# Patient Record
Sex: Male | Born: 1975 | Race: White | Hispanic: No | Marital: Married | Smoking: Never smoker
Health system: Southern US, Community
[De-identification: ages and names within clinical notes are randomized; demographics above are authoritative.]

## PROBLEM LIST (undated history)

## (undated) DIAGNOSIS — N200 Calculus of kidney: Secondary | ICD-10-CM

## (undated) DIAGNOSIS — I4891 Unspecified atrial fibrillation: Secondary | ICD-10-CM

## (undated) HISTORY — PX: TONSILLECTOMY: SUR1361

## (undated) HISTORY — PX: APPENDECTOMY: SHX54

---

## 1999-03-14 ENCOUNTER — Encounter: Payer: Self-pay | Admitting: Internal Medicine

## 1999-03-14 ENCOUNTER — Emergency Department (HOSPITAL_COMMUNITY): Admission: EM | Admit: 1999-03-14 | Discharge: 1999-03-14 | Payer: Self-pay | Admitting: Internal Medicine

## 2000-02-29 ENCOUNTER — Emergency Department (HOSPITAL_COMMUNITY): Admission: EM | Admit: 2000-02-29 | Discharge: 2000-02-29 | Payer: Self-pay | Admitting: Emergency Medicine

## 2001-01-19 ENCOUNTER — Encounter: Admission: RE | Admit: 2001-01-19 | Discharge: 2001-01-19 | Payer: Self-pay | Admitting: Family Medicine

## 2001-01-19 ENCOUNTER — Encounter: Payer: Self-pay | Admitting: Family Medicine

## 2004-01-07 ENCOUNTER — Emergency Department (HOSPITAL_COMMUNITY): Admission: EM | Admit: 2004-01-07 | Discharge: 2004-01-07 | Payer: Self-pay | Admitting: Emergency Medicine

## 2005-05-26 ENCOUNTER — Emergency Department (HOSPITAL_COMMUNITY): Admission: EM | Admit: 2005-05-26 | Discharge: 2005-05-26 | Payer: Self-pay | Admitting: Emergency Medicine

## 2006-02-07 ENCOUNTER — Emergency Department (HOSPITAL_COMMUNITY): Admission: EM | Admit: 2006-02-07 | Discharge: 2006-02-07 | Payer: Self-pay | Admitting: Emergency Medicine

## 2006-02-16 ENCOUNTER — Emergency Department (HOSPITAL_COMMUNITY): Admission: EM | Admit: 2006-02-16 | Discharge: 2006-02-16 | Payer: Self-pay | Admitting: Emergency Medicine

## 2006-02-21 ENCOUNTER — Encounter: Admission: RE | Admit: 2006-02-21 | Discharge: 2006-02-21 | Payer: Self-pay | Admitting: Family Medicine

## 2006-03-04 ENCOUNTER — Encounter: Admission: RE | Admit: 2006-03-04 | Discharge: 2006-03-04 | Payer: Self-pay | Admitting: Family Medicine

## 2007-11-27 ENCOUNTER — Observation Stay (HOSPITAL_COMMUNITY): Admission: EM | Admit: 2007-11-27 | Discharge: 2007-11-28 | Payer: Self-pay | Admitting: Emergency Medicine

## 2007-11-27 ENCOUNTER — Ambulatory Visit: Payer: Self-pay | Admitting: Cardiology

## 2007-12-06 ENCOUNTER — Ambulatory Visit: Payer: Self-pay

## 2007-12-08 ENCOUNTER — Ambulatory Visit: Payer: Self-pay

## 2009-03-05 ENCOUNTER — Emergency Department (HOSPITAL_COMMUNITY): Admission: EM | Admit: 2009-03-05 | Discharge: 2009-03-05 | Payer: Self-pay | Admitting: Emergency Medicine

## 2009-12-08 ENCOUNTER — Encounter: Admission: RE | Admit: 2009-12-08 | Discharge: 2009-12-08 | Payer: Self-pay | Admitting: Family Medicine

## 2010-01-12 ENCOUNTER — Emergency Department (HOSPITAL_COMMUNITY): Admission: EM | Admit: 2010-01-12 | Discharge: 2010-01-12 | Payer: Self-pay | Admitting: Emergency Medicine

## 2010-08-16 ENCOUNTER — Encounter: Payer: Self-pay | Admitting: Family Medicine

## 2010-12-08 NOTE — Discharge Summary (Signed)
[name], Grant NO.:  [id number]   MEDICAL RECORD NO.:  [id number]          PATIENT TYPE:  OBV   LOCATION:  3738                         FACILITY:  [facility]   PHYSICIAN:  Veverly Fells. Excell Seltzer, MD  DATE OF BIRTH:  Jun 27, 1976   DATE OF ADMISSION:  11/27/2007  DATE OF DISCHARGE:  11/28/2007                               DISCHARGE SUMMARY   PRIMARY CARDIOLOGIST:  Rollene Rotunda, MD.   PRIMARY CARE Yailyn Strack.Quita Skye Artis Flock, M.D.   DISCHARGE DIAGNOSIS:  Presyncope.   SECONDARY DIAGNOSES:  1. Chest pain.  2. Gastroesophageal reflux disease.   ALLERGIES:  No known drug allergies.   PROCEDURES:  None.   HISTORY OF PRESENT ILLNESS:  A 35 year old Caucasian male with no prior  cardiac history.  He was in his usual state of health until Nov 27, 2007,  while approximately 10:30 a.m. at work, he had sudden onset of flushing  diaphoresis associated with lightheadedness.  He subsequently developed  nausea and tachy palpitations with generalized achiness.  EMS was called  and the patient was taken to [facility] for further evaluation.  In the  ED, ECG showed no acute changes.  Point-of-care cardiac markers were  negative x1.  He was admitted for further evaluation.   HOSPITAL COURSE:  The patient ruled out for MI and has been maintained  on telemetry without any evidence tachybrady arrhythmias.  His blood  pressure and heart rates have been stable.  He had no recurrent symptoms  and has been set up for an outpatient exercise Myoview.  He will be  discharged to home today in good condition.   DISCHARGE LABS:  Hemoglobin 14.6, hematocrit 43.0, WBC 7.9, and  platelets 213. INR 1.0, sodium 140, potassium 4.1, chloride 104, CO2 of  31, BUN 6, creatinine 0.86, glucose 132, total bilirubin 0.5,  alkaline  phosphatase 59, AST 17, ALT 20, total protein 5.5, albumin 3.2, calcium  8.8, hemoglobin A1c 5.5, CK 41, MB 1.1, troponin I 0.01, total  cholesterol 148, triglycerides 171,  HDL 18, LDL 96, and TSH 0.906.   DISPOSITION:  The patient is being discharged to home today in good  condition.   FOLLOWUP PLAN AND APPOINTMENTS:  He is asked to follow up Dr. Artis Flock in  about 2 weeks.  He has outpatient exercise Myoview scheduled for Dec 06, 2007, at 8:15 a.m.   DISCHARGE MEDICATIONS:  1. Lexapro 20 mg daily.  2. Prilosec OTC 20 mg daily.   OUTSTANDING LAB STUDIES:  None.   DURATION OF DISCHARGE/ENCOUNTER:  Thirty five minutes including  physician time.      Nicolasa Ducking, ANP      Veverly Fells. Excell Seltzer, MD  Electronically Signed    CB/MEDQ  D:  11/28/2007  T:  11/29/2007  Job:  [id number]   cc:   Quita Skye. Artis Flock, M.D.

## 2010-12-08 NOTE — H&P (Signed)
NAME:  Grant [name], Grant [name] NO.:  [id number]   MEDICAL RECORD NO.:  [id number]          PATIENT TYPE:  OBV   LOCATION:  3738                         FACILITY:  [facility]   PHYSICIAN:  Rollene Rotunda, MD, FACCDATE OF BIRTH:  Nov 05, 1975   DATE OF ADMISSION:  11/27/2007  DATE OF DISCHARGE:                              HISTORY & PHYSICAL   PRIMARY CARE PHYSICIAN:  Domonique Brouillard. Artis Flock, MD   BRIEF HISTORY:  Mr. Nelles is a 35 year old white male who was  transferred via EMS to [facility] secondary to near syncope.  Mr. Yi states that he has been in good health until this morning  around 10:30, when he suddenly felt very warm and broke out in cold  sweats and did not feel quite right.  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  He states that he  got out of the car, and he stumbled and fell onto another car because he  was lightheaded and dizzy.  He did not loose consciousness and denies  fainting or blurry vision.  He denies prior occurrences.  The co-workers  assisted him back to the office, where he developed nausea and vomiting.  He thought that his heart rate might be going fast, but did not check  his pulse or blood pressure.  All in all, this lasted approximately 15  minutes, and he stated that his whole body felt achy.  He also described  to Dr. Antoine Poche a chest pressure that he gave a 4 out of 0-10 with all  this.  EMS was called; however, the report is not available.  In the  emergency room, he states that he feels fine, except now that he has a  headache.  It is also notable he does not usually eat breakfast;  however, last night, he also did not eat dinner.  He stated that he did  have some crackers yesterday afternoon, and he states that his food  intake has been adequate.   ALLERGIES:  No known drug allergies.   MEDICATIONS:  Include:  1. Prilosec 20 mg daily.  2. Lisinopril 20 mg daily.   PAST MEDICAL  HISTORY:  He specifically denies any diabetes,  hypertension, myocardial infarction, CVA, COPD, bleeding dyscrasias,  renal disorder, thyroid disorder, or hyperlipidemia.   PAST SURGICAL HISTORY:  Includes:  1. Left knee surgery.  2. T&A.  3. Appendectomy.  4. Left leg fracture with repair.   SOCIAL HISTORY:  He resides in [place] with his wife.  He has 1 son,  who is alive and well.  He works at [employer].  He has  never smoked.  Denies alcohol, drugs, other medications, specific diet,  or exercise.   FAMILY HISTORY:  Notable for his mother, who is alive at 25.  She thinks  her health is okay.  His father is 63.  He had his bypass surgery at age  22 with subsequent PTCAs.  He also has a history of diabetes with  hyperlipidemia.  He does not have any brothers and sisters.  REVIEW OF SYSTEMS:  In addition to the above is notable for positive  slurring.  His wife feels that he may have obstructive sleep apnea;  however, he has never been evaluated, and history of GERD, depression,  and mood swings, which have been treated adequately with Lexapro.  All  other points are unremarkable.   PHYSICAL EXAMINATION:  GENERAL:  Well-nourished, well-developed, obese  white male, in no apparent distress.  Father and wife are present.  VITAL SIGNS:  Temperature is 97.8, blood pressure 119/80, pulse 68,  respirations 18, and 97% sat on room air.  HEENT:  Unremarkable.  NECK:  Supple without thyromegaly, adenopathy, or JVD.  No carotid  bruits.  CHEST:  Symmetrical excursion.  Lung sounds slightly diminished at the  base, but clear to auscultation.  HEART:  PMI is not displaced.  Regular rate and rhythm.  I do not  appreciate any murmurs, rubs, clicks, and gallops.  He does have  physiological split S2.  All __________  are symmetrical and intact  without abdominal or femoral bruits.  ABDOMEN:  Obese.  Bowel sounds are present without organomegaly, masses,  or tenderness.   EXTREMITIES:  No cyanosis, clubbing, or edema.  MUSCULOSKELETAL:  Unremarkable.  NEUROLOGIC:  Unremarkable.   LABORATORY DATA:  Chest x-ray showed low-level inspiration, no acute  findings.  EKG showed normal rhythm, early repolarization, unusual P-  wave axis; however, it is noted compared to an old EKG in November 2006,  there were no significant changes.  H&H is 14.0 and 41.5.  Normal  platelets of 213, and WBC 7.9.  Electrolytes showed sodium of 140,  potassium 40, BUN 7, creatinine 1.0, and glucose 87.  Point-of-care  markers negative x1.   IMPRESSION:  1. Near syncope, sounds vasovagal by history.  2. Chest discomfort with total body aching of uncertain etiology and a      significant family history.  His risk factors include obesity.  3. Obesity with probable obstructive sleep apnea.   DISPOSITION:  Dr. Antoine Poche reviewed the patient's history, spoke with  and examined the patient.  We will admit him overnight for observation.  If his enzymes are negative, we will discharge him home, as scheduled  for a 2-day outpatient stress Myoview stress images on Dec 06, 2007 at  8:15 a.m. and the resting images on Dec 07, 2007.  Information does need  to be faxed to the office.  We have also counseled him on weight  reduction, specific diet, exercise, and weight loss.  We will also check  his electrolyte status given his past history.      Joellyn Rued, PA-C      Rollene Rotunda, MD, [facility]  Electronically Signed    EW/MEDQ  D:  11/27/2007  T:  11/28/2007  Job:  [id number]   cc:   Quita Skye. Artis Flock, M.D.  Rollene Rotunda, MD, [facility]

## 2018-05-02 ENCOUNTER — Other Ambulatory Visit: Payer: Self-pay | Admitting: Sports Medicine

## 2018-05-02 DIAGNOSIS — G8929 Other chronic pain: Secondary | ICD-10-CM

## 2018-05-02 DIAGNOSIS — M25561 Pain in right knee: Principal | ICD-10-CM

## 2018-05-07 ENCOUNTER — Ambulatory Visit
Admission: RE | Admit: 2018-05-07 | Discharge: 2018-05-07 | Disposition: A | Discharge status: Home / Self Care | Payer: BLUE CROSS/BLUE SHIELD | Source: Ambulatory Visit | Attending: Sports Medicine | Admitting: Sports Medicine

## 2018-05-07 DIAGNOSIS — M25561 Pain in right knee: Principal | ICD-10-CM

## 2018-05-07 DIAGNOSIS — G8929 Other chronic pain: Secondary | ICD-10-CM

## 2020-12-29 DIAGNOSIS — N390 Urinary tract infection, site not specified: Secondary | ICD-10-CM | POA: Diagnosis not present

## 2020-12-29 DIAGNOSIS — K760 Fatty (change of) liver, not elsewhere classified: Secondary | ICD-10-CM | POA: Diagnosis not present

## 2020-12-29 DIAGNOSIS — N3001 Acute cystitis with hematuria: Secondary | ICD-10-CM | POA: Diagnosis not present

## 2020-12-29 DIAGNOSIS — N3090 Cystitis, unspecified without hematuria: Secondary | ICD-10-CM | POA: Diagnosis not present

## 2020-12-29 DIAGNOSIS — Z79899 Other long term (current) drug therapy: Secondary | ICD-10-CM | POA: Diagnosis not present

## 2020-12-29 DIAGNOSIS — K449 Diaphragmatic hernia without obstruction or gangrene: Secondary | ICD-10-CM | POA: Diagnosis not present

## 2020-12-29 DIAGNOSIS — N200 Calculus of kidney: Secondary | ICD-10-CM | POA: Diagnosis not present

## 2020-12-29 DIAGNOSIS — M545 Low back pain, unspecified: Secondary | ICD-10-CM | POA: Diagnosis not present

## 2020-12-29 DIAGNOSIS — N23 Unspecified renal colic: Secondary | ICD-10-CM | POA: Diagnosis not present

## 2020-12-29 DIAGNOSIS — M5459 Other low back pain: Secondary | ICD-10-CM | POA: Diagnosis not present

## 2020-12-29 DIAGNOSIS — Z87442 Personal history of urinary calculi: Secondary | ICD-10-CM | POA: Diagnosis not present

## 2020-12-29 DIAGNOSIS — M549 Dorsalgia, unspecified: Secondary | ICD-10-CM | POA: Diagnosis not present

## 2020-12-29 DIAGNOSIS — R319 Hematuria, unspecified: Secondary | ICD-10-CM | POA: Diagnosis not present

## 2021-01-06 DIAGNOSIS — N200 Calculus of kidney: Secondary | ICD-10-CM | POA: Diagnosis not present

## 2021-01-06 DIAGNOSIS — R8271 Bacteriuria: Secondary | ICD-10-CM | POA: Diagnosis not present

## 2021-01-29 DIAGNOSIS — N200 Calculus of kidney: Secondary | ICD-10-CM | POA: Diagnosis not present

## 2021-01-29 DIAGNOSIS — N5201 Erectile dysfunction due to arterial insufficiency: Secondary | ICD-10-CM | POA: Diagnosis not present

## 2021-03-03 ENCOUNTER — Emergency Department (HOSPITAL_COMMUNITY)
Admission: EM | Admit: 2021-03-03 | Discharge: 2021-03-04 | Disposition: A | Discharge status: Home / Self Care | Payer: Managed Care, Other (non HMO) | Attending: Emergency Medicine | Admitting: Emergency Medicine

## 2021-03-03 ENCOUNTER — Other Ambulatory Visit: Payer: Self-pay

## 2021-03-03 ENCOUNTER — Emergency Department (HOSPITAL_COMMUNITY): Payer: Managed Care, Other (non HMO)

## 2021-03-03 DIAGNOSIS — I517 Cardiomegaly: Secondary | ICD-10-CM | POA: Diagnosis not present

## 2021-03-03 DIAGNOSIS — M7989 Other specified soft tissue disorders: Secondary | ICD-10-CM | POA: Diagnosis not present

## 2021-03-03 DIAGNOSIS — R0789 Other chest pain: Secondary | ICD-10-CM | POA: Diagnosis not present

## 2021-03-03 DIAGNOSIS — R079 Chest pain, unspecified: Secondary | ICD-10-CM | POA: Insufficient documentation

## 2021-03-03 DIAGNOSIS — M25562 Pain in left knee: Secondary | ICD-10-CM | POA: Diagnosis not present

## 2021-03-03 LAB — BASIC METABOLIC PANEL
Anion gap: 8 (ref 5–15)
BUN: 14 mg/dL (ref 6–20)
CO2: 26 mmol/L (ref 22–32)
Calcium: 9 mg/dL (ref 8.9–10.3)
Chloride: 104 mmol/L (ref 98–111)
Creatinine, Ser: 1.09 mg/dL (ref 0.61–1.24)
GFR, Estimated: >60 mL/min (ref >60)
Glucose, Bld: 104 mg/dL — ABNORMAL HIGH (ref 70–99)
Potassium: 4.1 mmol/L (ref 3.5–5.1)
Sodium: 138 mmol/L (ref 135–145)

## 2021-03-03 LAB — TROPONIN I (HIGH SENSITIVITY)
Troponin I (High Sensitivity): 3 ng/L (ref <18)
Troponin I (High Sensitivity): 4 ng/L (ref <18)

## 2021-03-03 LAB — CBC
HCT: 44.4 % (ref 39.0–52.0)
Hemoglobin: 14.4 g/dL (ref 13.0–17.0)
MCH: 28.5 pg (ref 26.0–34.0)
MCHC: 32.4 g/dL (ref 30.0–36.0)
MCV: 87.9 fL (ref 80.0–100.0)
Platelets: 185 10*3/uL (ref 150–400)
RBC: 5.05 MIL/uL (ref 4.22–5.81)
RDW: 12.5 % (ref 11.5–15.5)
WBC: 7.4 10*3/uL (ref 4.0–10.5)
nRBC: 0 % (ref 0.0–0.2)

## 2021-03-03 NOTE — ED Provider Notes (Signed)
Emergency Medicine Provider Triage Evaluation Note  Grant [name] , a 45 y.o. male  was evaluated in triage.  Pt complains of chest pain x 45 minutes along with left knee swelling.  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  Also left-sided constant chest pain that he rates about a 5 out of 10, endorsing shortness of breath, feels that he is more fatigued when doing physical activity.  No history of CAD, family history of CAD notable for father with multiple MIs.  Review of Systems  Positive: Chest pain, shortness of breath, left knee pain Negative: Fever, cough  Physical Exam  BP (!) 131/91 (BP Location: Left Arm)   Pulse 84   Temp 98.1 F (36.7 C) (Oral)   Resp 16   Ht 6\' 1"  (1.854 m)   Wt (!) 156.5 kg   SpO2 98%   BMI 45.52 kg/m  Gen:   Awake, no distress   Resp:  Normal effort  MSK:   Moves extremities without difficulty Other:  Small left knee effusion, minimally swollen.  Medical Decision Making  Medically screening exam initiated at 4:32 PM.  Appropriate orders placed.  was informed that the remainder of the evaluation will be completed by another provider, this initial triage assessment does not replace that evaluation, and the importance of remaining in the ED until their evaluation is complete.     Geri Seminole, PA-C 03/03/21 1634    05/03/21, MD 03/03/21 2259

## 2021-03-03 NOTE — ED Triage Notes (Signed)
Pt has had CP with shob x 45 min as well as left knee swelling.  Pt states he was helping at [employer] camp this morning when it happened. No n/v/d

## 2021-03-04 ENCOUNTER — Ambulatory Visit (HOSPITAL_BASED_OUTPATIENT_CLINIC_OR_DEPARTMENT_OTHER)
Admission: RE | Admit: 2021-03-04 | Discharge: 2021-03-04 | Disposition: A | Discharge status: Home / Self Care | Payer: Managed Care, Other (non HMO) | Source: Ambulatory Visit | Attending: Emergency Medicine | Admitting: Emergency Medicine

## 2021-03-04 DIAGNOSIS — M7989 Other specified soft tissue disorders: Secondary | ICD-10-CM | POA: Insufficient documentation

## 2021-03-04 MED ORDER — ENOXAPARIN SODIUM 300 MG/3ML IJ SOLN
1.0000 mg/kg | Freq: Once | INTRAMUSCULAR | Status: AC
  Administered 2021-03-04: 155 mg via SUBCUTANEOUS
  Filled 2021-03-04: qty 1.55

## 2021-03-04 NOTE — Discharge Instructions (Addendum)
Return tomorrow for the ultrasound of your leg to rule out a blood clot.  If the test is negative, this is likely a mild tendinitis or inflammation that can be treated with rest, compression, NSAIDs.  If there is a clot, we will need to put you on blood thinners.

## 2021-03-04 NOTE — Progress Notes (Signed)
Lower extremity venous has been completed.   Preliminary results in CV Proc.   Blanch Media 03/04/2021 11:26 AM

## 2021-03-04 NOTE — ED Provider Notes (Signed)
[facility] EMERGENCY DEPARTMENT Provider Note   CSN: [id number] Arrival date & time: 03/03/21  1612     History Chief Complaint  Patient presents with   Chest Pain    Grant [name] is a 45 y.o. male.  Patient presents to the emergency department with multiple complaints.  Patient reports that he woke this morning with a swollen left knee.  Since then the swelling has worsened.  Patient reports that it hurts more when he tries to bend the knee.  There is some swelling below the knee now.  Earlier today he had an episode of chest pain.  He had pain in the left side of his chest that lasted for 5 minutes and then resolved.  It has not reoccurred.  No shortness of breath.      No past medical history on file.  There are no problems to display for this patient.        No family history on file.     Home Medications Prior to Admission medications   Not on File    Allergies    Patient has no known allergies.  Review of Systems   Review of Systems  Cardiovascular:  Positive for chest pain.  Musculoskeletal:  Positive for arthralgias.  All other systems reviewed and are negative.  Physical Exam Updated Vital Signs BP 122/86 (BP Location: Left Arm)   Pulse 79   Temp 98.3 F (36.8 C) (Oral)   Resp 20   Ht 6\' 1"  (1.854 m)   Wt (!) 156.5 kg   SpO2 96%   BMI 45.52 kg/m   Physical Exam Vitals and nursing note reviewed.  Constitutional:      General: He is not in acute distress.    Appearance: Normal appearance. He is well-developed.  HENT:     Head: Normocephalic and atraumatic.     Right Ear: Hearing normal.     Left Ear: Hearing normal.     Nose: Nose normal.  Eyes:     Conjunctiva/sclera: Conjunctivae normal.     Pupils: Pupils are equal, round, and reactive to light.  Cardiovascular:     Rate and Rhythm: Regular rhythm.     Heart sounds: S1 normal and S2 normal. No murmur heard.   No friction rub. No gallop.  Pulmonary:      Effort: Pulmonary effort is normal. No respiratory distress.     Breath sounds: Normal breath sounds.  Chest:     Chest wall: No tenderness.  Abdominal:     General: Bowel sounds are normal.     Palpations: Abdomen is soft.     Tenderness: There is no abdominal tenderness. There is no guarding or rebound. Negative signs include Murphy's sign and McBurney's sign.     Hernia: No hernia is present.  Musculoskeletal:        General: Normal range of motion.     Cervical back: Normal range of motion and neck supple.     Left knee: Swelling present. No deformity, effusion, erythema or ecchymosis. Normal range of motion. Tenderness present.     Left lower leg: Swelling present.  Skin:    General: Skin is warm and dry.     Findings: No rash.  Neurological:     Mental Status: He is alert and oriented to person, place, and time.     GCS: GCS eye subscore is 4. GCS verbal subscore is 5. GCS motor subscore is 6.     Cranial Nerves:  No cranial nerve deficit.     Sensory: No sensory deficit.     Coordination: Coordination normal.  Psychiatric:        Speech: Speech normal.        Behavior: Behavior normal.        Thought Content: Thought content normal.    ED Results / Procedures / Treatments   Labs (all labs ordered are listed, but only abnormal results are displayed) Labs Reviewed  BASIC METABOLIC PANEL - Abnormal; Notable for the following components:      Result Value   Glucose, Bld 104 (*)    All other components within normal limits  CBC  TROPONIN I (HIGH SENSITIVITY)  TROPONIN I (HIGH SENSITIVITY)    EKG EKG Interpretation  Date/Time:  Tuesday March 03 2021 16:23:03 EDT Ventricular Rate:  89 PR Interval:  144 QRS Duration: 108 QT Interval:  366 QTC Calculation: 445 R Axis:   70 Text Interpretation: Normal sinus rhythm Cannot rule out Anterior infarct , age undetermined Abnormal ECG Confirmed by Gilda Crease [phone]) on 03/04/2021 1:43:41 AM  Radiology DG Chest  2 View  Result Date: 03/03/2021 CLINICAL DATA:  Chest pain EXAM: CHEST - 2 VIEW COMPARISON:  11/27/2007 FINDINGS: Heart is mildly enlarged. Lungs clear. No effusions. No acute bony abnormality. IMPRESSION: Cardiomegaly.  No active disease. Electronically Signed   By: Charlett Nose M.D.   On: 03/03/2021 17:33   DG Knee 2 Views Left  Result Date: 03/03/2021 CLINICAL DATA:  Swelling EXAM: LEFT KNEE - 1-2 VIEW COMPARISON:  None. FINDINGS: No evidence of fracture, dislocation, or joint effusion. No evidence of arthropathy or other focal bone abnormality. Soft tissues are unremarkable. IMPRESSION: Negative. Electronically Signed   By: Charlett Nose M.D.   On: 03/03/2021 17:35    Procedures Procedures   Medications Ordered in ED Medications  enoxaparin (LOVENOX) 100 mg/mL injection 155 mg (has no administration in time range)    ED Course  I have reviewed the triage vital signs and the nursing notes.  Pertinent labs & imaging results that were available during my care of the patient were reviewed by me and considered in my medical decision making (see chart for details).    MDM Rules/Calculators/A&P                           Patient presents primarily for evaluation of pain in the left knee.  Patient awakened with pain and swelling of the left knee today that has worsened through the day.  Examination reveals some slight swelling of the soft tissues in the prepatellar and suprapatellar region but there are no palpable effusions.  No erythema or warmth.  There is nothing to suggest septic arthritis.  No ligamentous instability.  Findings consistent with nonspecific swelling, treat conservatively.  Patient concerned about possibility of blood clot.  He does have some swelling of the left calf with no tenderness, venous cords.  Suspect that this is secondary to the knee swelling, but cannot rule out DVT.  Offered D-dimer versus outpatient ultrasound.  He would like to have the ultrasound  performed.  Patient did have chest pain earlier.  This seems very unlikely for a PE.  Pain lasted for 5 minutes and then resolved, did not recur.  This was earlier today, many hours ago.  Cardiac evaluation is negative.  Discussed the possibility of PE with the patient.  Offered CT chest but it is also reasonable to treat with  Lovenox and perform venous duplex.  If negative, PE is extremely unlikely.  If positive, would be a small PE and initiation treatment of DVT would be curative for small PE as well.  He understands that we have not fully come up for PE and he needs to return immediately or call 911 for chest pain or shortness of breath.  Final Clinical Impression(s) / ED Diagnoses Final diagnoses:  Acute pain of left knee  Non-cardiac chest pain    Rx / DC Orders ED Discharge Orders          Ordered    LE VENOUS        03/04/21 0214             Gilda Crease, MD 03/04/21 [phone]

## 2021-08-25 DIAGNOSIS — M1712 Unilateral primary osteoarthritis, left knee: Secondary | ICD-10-CM | POA: Diagnosis not present

## 2021-08-25 DIAGNOSIS — M25562 Pain in left knee: Secondary | ICD-10-CM | POA: Diagnosis not present

## 2021-09-01 DIAGNOSIS — M25562 Pain in left knee: Secondary | ICD-10-CM | POA: Diagnosis not present

## 2021-09-03 DIAGNOSIS — M25562 Pain in left knee: Secondary | ICD-10-CM | POA: Diagnosis not present

## 2021-10-28 DIAGNOSIS — R112 Nausea with vomiting, unspecified: Secondary | ICD-10-CM | POA: Diagnosis not present

## 2021-10-28 DIAGNOSIS — A084 Viral intestinal infection, unspecified: Secondary | ICD-10-CM | POA: Diagnosis not present

## 2022-01-11 ENCOUNTER — Emergency Department (HOSPITAL_BASED_OUTPATIENT_CLINIC_OR_DEPARTMENT_OTHER)
Admission: EM | Admit: 2022-01-11 | Discharge: 2022-01-11 | Disposition: A | Discharge status: Home / Self Care | Payer: Self-pay | Attending: Emergency Medicine | Admitting: Emergency Medicine

## 2022-01-11 ENCOUNTER — Emergency Department (HOSPITAL_BASED_OUTPATIENT_CLINIC_OR_DEPARTMENT_OTHER): Payer: Self-pay | Admitting: Radiology

## 2022-01-11 ENCOUNTER — Other Ambulatory Visit: Payer: Self-pay

## 2022-01-11 ENCOUNTER — Encounter (HOSPITAL_BASED_OUTPATIENT_CLINIC_OR_DEPARTMENT_OTHER): Payer: Self-pay | Admitting: Radiology

## 2022-01-11 ENCOUNTER — Other Ambulatory Visit (HOSPITAL_BASED_OUTPATIENT_CLINIC_OR_DEPARTMENT_OTHER): Payer: Self-pay

## 2022-01-11 DIAGNOSIS — S63501A Unspecified sprain of right wrist, initial encounter: Secondary | ICD-10-CM | POA: Insufficient documentation

## 2022-01-11 DIAGNOSIS — W228XXA Striking against or struck by other objects, initial encounter: Secondary | ICD-10-CM | POA: Insufficient documentation

## 2022-01-11 MED ORDER — OXYCODONE HCL 5 MG PO TABS
ORAL_TABLET | ORAL | 0 refills | Status: DC
  Filled 2022-01-11: qty 12, 3d supply, fill #0

## 2022-01-11 MED ORDER — IBUPROFEN 800 MG PO TABS: 800.0000 mg | ORAL_TABLET | Freq: Three times a day (TID) | ORAL | 0 refills | Status: DC | PRN

## 2022-01-11 MED ORDER — OXYCODONE HCL 5 MG PO TABS: 5.0000 mg | ORAL_TABLET | Freq: Four times a day (QID) | ORAL | 0 refills | Status: AC | PRN

## 2022-01-11 MED ORDER — OXYCODONE HCL 5 MG PO TABS
5.0000 mg | ORAL_TABLET | Freq: Once | ORAL | Status: AC
  Administered 2022-01-11: 5 mg via ORAL
  Filled 2022-01-11: qty 1

## 2022-01-11 NOTE — ED Provider Notes (Signed)
[facility] EMERGENCY DEPT Provider Note   CSN: [id number] Arrival date & time: 01/11/22  0941     History  Chief Complaint  Patient presents with   Wrist Pain    Right    Grant [name] is a 46 y.o. male.  Pt is a 47 yo male presenting for right hand pain. Pt admits to right hand pain after jumping in a pool and hitting hands against bottom. States his friend then picked him up and squeezed him further injuring his hand. Denies swelling, bruising, sensation, or motor dysfunction. Minimal improvement with motrin at home.   The history is provided by the patient. No language interpreter was used.  Wrist Pain       Home Medications Prior to Admission medications   Not on File      Allergies    Patient has no known allergies.    Review of Systems   Review of Systems  Physical Exam Updated Vital Signs BP 140/87   Pulse 95   Temp 97.9 F (36.6 C) (Oral)   Resp 18   Ht 6\' 1"  (1.854 m)   Wt (!) 158.8 kg   SpO2 97%   BMI 46.18 kg/m  Physical Exam Vitals and nursing note reviewed.  HENT:     Head: Normocephalic and atraumatic.  Cardiovascular:     Rate and Rhythm: Normal rate.  Pulmonary:     Effort: Pulmonary effort is normal.  Musculoskeletal:     Right elbow: Normal.     Left elbow: Normal.     Right forearm: Bony tenderness present.     Right wrist: Normal.     Left wrist: Normal.     Right hand: Normal. Normal pulse.     Left hand: Normal. Normal pulse.  Skin:    Capillary Refill: Capillary refill takes less than 2 seconds.     Findings: No bruising or rash.  Neurological:     General: No focal deficit present.     Mental Status: He is alert.     GCS: GCS eye subscore is 4. GCS verbal subscore is 5. GCS motor subscore is 6.     Sensory: No sensory deficit.     Motor: No weakness.     ED Results / Procedures / Treatments   Labs (all labs ordered are listed, but only abnormal results are displayed) Labs Reviewed - No data to  display  EKG None  Radiology DG Wrist Complete Right  Result Date: 01/11/2022 CLINICAL DATA:  Trauma, pain EXAM: RIGHT WRIST - COMPLETE 3+ VIEW COMPARISON:  None Available. FINDINGS: There is no evidence of fracture or dislocation. There is no evidence of arthropathy or other focal bone abnormality. Soft tissues are unremarkable. IMPRESSION: No fracture or dislocation is seen in the right wrist. Electronically Signed   By: 01/13/2022 M.D.   On: 01/11/2022 10:51    Procedures Procedures    Medications Ordered in ED Medications  oxyCODONE (Oxy IR/ROXICODONE) immediate release tablet 5 mg (has no administration in time range)    ED Course/ Medical Decision Making/ A&P                           Medical Decision Making Risk Prescription drug management.   11:23 AM 46 yo male presenting for right hand pain after injury. Patient is Aox3, no acute distress, afebrile, with stable vitals. Physical exam demonstrates tenderness to palpation of distal right radius. No gross deformities.  Arm neurovascularly intact. No snuff box tenderness. Xray demonstrates no acute process. Ace wrap applied. Medication given for pain control.   Patient in no distress and overall condition improved here in the ED. Detailed discussions were had with the patient regarding current findings, and need for close f/u with orthopedic surgery.The patient has been instructed to return immediately if the symptoms worsen in any way for re-evaluation. Patient verbalized understanding and is in agreement with current care plan. All questions answered prior to discharge.         Final Clinical Impression(s) / ED Diagnoses Final diagnoses:  Sprain of right wrist, initial encounter    Rx / DC Orders ED Discharge Orders     None         Franne Forts, DO 01/11/22 1123

## 2022-01-11 NOTE — Discharge Instructions (Signed)
Rest, elevated, ice, compression, motrin/tylenol for mild pain, oxycodone sent to pharmacy for severe pain

## 2022-01-11 NOTE — ED Triage Notes (Addendum)
Patient arrives with right wrist pain after jumping into the pool yesterday (wrist bent backwards slightly) and then having someone grab the same wrist. Rates pain a 6/10. Unable to move wrist backwards or forwards.   Patient took ibuprofen prior to coming

## 2022-01-11 NOTE — ED Notes (Signed)
Patient verbalizes understanding of discharge instructions. Opportunity for questioning and answers were provided. Patient discharged from ED.  °

## 2022-04-29 ENCOUNTER — Other Ambulatory Visit: Payer: Self-pay

## 2022-04-29 ENCOUNTER — Other Ambulatory Visit (HOSPITAL_BASED_OUTPATIENT_CLINIC_OR_DEPARTMENT_OTHER): Payer: Self-pay

## 2022-04-29 ENCOUNTER — Emergency Department (HOSPITAL_BASED_OUTPATIENT_CLINIC_OR_DEPARTMENT_OTHER)
Admission: EM | Admit: 2022-04-29 | Discharge: 2022-04-29 | Disposition: A | Discharge status: Home / Self Care | Payer: Self-pay | Attending: Emergency Medicine | Admitting: Emergency Medicine

## 2022-04-29 ENCOUNTER — Encounter (HOSPITAL_BASED_OUTPATIENT_CLINIC_OR_DEPARTMENT_OTHER): Payer: Self-pay

## 2022-04-29 ENCOUNTER — Emergency Department (HOSPITAL_BASED_OUTPATIENT_CLINIC_OR_DEPARTMENT_OTHER): Payer: Self-pay

## 2022-04-29 DIAGNOSIS — N201 Calculus of ureter: Secondary | ICD-10-CM | POA: Insufficient documentation

## 2022-04-29 LAB — COMPREHENSIVE METABOLIC PANEL
ALT: 22 U/L (ref 0–44)
AST: 17 U/L (ref 15–41)
Albumin: 4.2 g/dL (ref 3.5–5.0)
Alkaline Phosphatase: 72 U/L (ref 38–126)
Anion gap: 9 (ref 5–15)
BUN: 13 mg/dL (ref 6–20)
CO2: 27 mmol/L (ref 22–32)
Calcium: 9.4 mg/dL (ref 8.9–10.3)
Chloride: 105 mmol/L (ref 98–111)
Creatinine, Ser: 0.98 mg/dL (ref 0.61–1.24)
GFR, Estimated: >60 mL/min (ref >60)
Glucose, Bld: 111 mg/dL — ABNORMAL HIGH (ref 70–99)
Potassium: 4 mmol/L (ref 3.5–5.1)
Sodium: 141 mmol/L (ref 135–145)
Total Bilirubin: 0.7 mg/dL (ref 0.3–1.2)
Total Protein: 7 g/dL (ref 6.5–8.1)

## 2022-04-29 LAB — CBC
HCT: 47.1 % (ref 39.0–52.0)
Hemoglobin: 15.6 g/dL (ref 13.0–17.0)
MCH: 28.6 pg (ref 26.0–34.0)
MCHC: 33.1 g/dL (ref 30.0–36.0)
MCV: 86.3 fL (ref 80.0–100.0)
Platelets: 212 10*3/uL (ref 150–400)
RBC: 5.46 MIL/uL (ref 4.22–5.81)
RDW: 13 % (ref 11.5–15.5)
WBC: 7.3 10*3/uL (ref 4.0–10.5)
nRBC: 0 % (ref 0.0–0.2)

## 2022-04-29 LAB — LIPASE, BLOOD: Lipase: 26 U/L (ref 11–51)

## 2022-04-29 LAB — URINALYSIS, ROUTINE W REFLEX MICROSCOPIC
Bilirubin Urine: NEGATIVE
Glucose, UA: NEGATIVE mg/dL
Ketones, ur: NEGATIVE mg/dL
Leukocytes,Ua: NEGATIVE
Nitrite: NEGATIVE
Protein, ur: 30 mg/dL — AB
Specific Gravity, Urine: 1.032 — ABNORMAL HIGH (ref 1.005–1.030)
pH: 5.5 (ref 5.0–8.0)

## 2022-04-29 MED ORDER — IOHEXOL 300 MG/ML  SOLN
100.0000 mL | Freq: Once | INTRAMUSCULAR | Status: AC | PRN
  Administered 2022-04-29: 100 mL via INTRAVENOUS

## 2022-04-29 MED ORDER — ONDANSETRON 4 MG PO TBDP
4.0000 mg | ORAL_TABLET | Freq: Three times a day (TID) | ORAL | 0 refills | Status: DC | PRN
  Filled 2022-04-29: qty 20, 7d supply, fill #0

## 2022-04-29 MED ORDER — OXYCODONE-ACETAMINOPHEN 5-325 MG PO TABS
1.0000 | ORAL_TABLET | Freq: Four times a day (QID) | ORAL | 0 refills | Status: DC | PRN
  Filled 2022-04-29: qty 15, 4d supply, fill #0

## 2022-04-29 MED ORDER — TAMSULOSIN HCL 0.4 MG PO CAPS
0.4000 mg | ORAL_CAPSULE | Freq: Every day | ORAL | 0 refills | Status: DC
  Filled 2022-04-29: qty 30, 30d supply, fill #0

## 2022-04-29 NOTE — ED Notes (Signed)
Discharge instructions, follow up care, and prescriptions reviewed and explained, pt verbalized understanding. Pt caox4 and ambulatory on d/c.  

## 2022-04-29 NOTE — ED Triage Notes (Signed)
Patient here POV from Home.  Endorses LLQ Pain approximately 1 Week ago that was mildly relived with OTC Medication. Worsened several days afterwards.   No N/V/D. No Fevers. No Dysuria.   NAD Noted during Triage. A&Ox4. GCS 15. Ambulatory.

## 2022-04-29 NOTE — ED Provider Notes (Signed)
[facility] EMERGENCY DEPT Provider Note   CSN: [id number] Arrival date & time: 04/29/22  1241    History  Chief Complaint  Patient presents with   Abdominal Pain    Grant [name] is a 46 y.o. male history of kidney stones here for evaluation of left lower quadrant abdominal pain.  Began approximately 1 week ago.  Prior to that had a "stomach bug."  Last bowel movement was approximately 2 days ago without melena or bright red blood per rectum.  He does admit to constipation and irregular bowel movements at baseline.  Initially took some Gas-X and some OTC medications with relief however pain continues to return intermittently.  He denies any fevers, nausea, vomiting. No flank pain.  No dysuria hematuria.  States is unsure if pain is similar to his prior kidney stones.  Never had a colonoscopy. Prior appendectomy as a child. No hx of SBO. Passing flatus. Pain does not radiates into groin, scrotum.  HPI     Home Medications Prior to Admission medications   Medication Sig Start Date End Date Taking? Authorizing Provider  ondansetron (ZOFRAN-ODT) 4 MG disintegrating tablet Dissolve 1 tablet under the tongue every 8 (eight) hours as needed for nausea or vomiting. 04/29/22  Yes Kule Gascoigne A, PA-C  oxyCODONE-acetaminophen (PERCOCET/ROXICET) 5-325 MG tablet Take 1 tablet by mouth every 6 (six) hours as needed for severe pain. 04/29/22  Yes Travonna Swindle A, PA-C  tamsulosin (FLOMAX) 0.4 MG CAPS capsule Take 1 capsule (0.4 mg total) by mouth daily. 04/29/22  Yes Glena Pharris A, PA-C  ibuprofen (ADVIL) 800 MG tablet Take 1 tablet (800 mg total) by mouth every 8 (eight) hours as needed for mild pain. 01/11/22   Edwin Dada P, DO  oxyCODONE (OXY IR/ROXICODONE) 5 MG immediate release tablet Take 1 tablet by mouth every 6 hours as needed for up to 3 days for severe pain 01/11/22   Franne Forts, DO      Allergies    Patient has no known allergies.    Review of Systems    Review of Systems  Constitutional: Negative.   HENT: Negative.    Respiratory: Negative.    Cardiovascular: Negative.   Gastrointestinal:  Positive for abdominal pain and constipation. Negative for abdominal distention, anal bleeding, blood in stool, diarrhea, nausea, rectal pain and vomiting.  Genitourinary: Negative.   Musculoskeletal: Negative.   Skin: Negative.   Neurological: Negative.   All other systems reviewed and are negative.  Physical Exam Updated Vital Signs BP 119/72 (BP Location: Right Arm)   Pulse 80   Temp 98.5 F (36.9 C)   Resp 16   Ht 6\' 1"  (1.854 m)   Wt (!) 158.8 kg   SpO2 96%   BMI 46.19 kg/m  Physical Exam Vitals and nursing note reviewed.  Constitutional:      General: He is not in acute distress.    Appearance: He is well-developed. He is not ill-appearing, toxic-appearing or diaphoretic.  HENT:     Head: Normocephalic and atraumatic.     Mouth/Throat:     Mouth: Mucous membranes are moist.  Eyes:     Pupils: Pupils are equal, round, and reactive to light.  Cardiovascular:     Rate and Rhythm: Normal rate and regular rhythm.     Heart sounds: Normal heart sounds.  Pulmonary:     Effort: Pulmonary effort is normal. No respiratory distress.     Breath sounds: Normal breath sounds.  Abdominal:  General: Bowel sounds are normal. There is no distension.     Palpations: Abdomen is soft.     Tenderness: There is abdominal tenderness in the left lower quadrant. There is no right CVA tenderness, left CVA tenderness, guarding or rebound. Negative signs include Murphy's sign and McBurney's sign.     Hernia: No hernia is present.  Musculoskeletal:        General: Normal range of motion.     Cervical back: Normal range of motion and neck supple.  Skin:    General: Skin is warm and dry.  Neurological:     General: No focal deficit present.     Mental Status: He is alert and oriented to person, place, and time.    ED Results / Procedures /  Treatments   Labs (all labs ordered are listed, but only abnormal results are displayed) Labs Reviewed  COMPREHENSIVE METABOLIC PANEL - Abnormal; Notable for the following components:      Result Value   Glucose, Bld 111 (*)    All other components within normal limits  URINALYSIS, ROUTINE W REFLEX MICROSCOPIC - Abnormal; Notable for the following components:   APPearance HAZY (*)    Specific Gravity, Urine 1.032 (*)    Hgb urine dipstick MODERATE (*)    Protein, ur 30 (*)    All other components within normal limits  LIPASE, BLOOD  CBC    EKG None  Radiology CT ABDOMEN PELVIS W CONTRAST  Result Date: 04/29/2022 CLINICAL DATA:  Left lower quadrant abdominal pain x1 week constipation, history of renal stones. EXAM: CT ABDOMEN AND PELVIS WITH CONTRAST TECHNIQUE: Multidetector CT imaging of the abdomen and pelvis was performed using the standard protocol following bolus administration of intravenous contrast. RADIATION DOSE REDUCTION: This exam was performed according to the departmental dose-optimization program which includes automated exposure control, adjustment of the mA and/or kV according to patient size and/or use of iterative reconstruction technique. CONTRAST:  123mL OMNIPAQUE IOHEXOL 300 MG/ML  SOLN COMPARISON:  None Available. FINDINGS: Lower chest: No acute abnormality.  Small hiatal hernia. Hepatobiliary: Hypodensity in the posterior right lobe of the liver measures 7.1 cm on image 15/2 this appears to demonstrate peripheral nodular discontinuous foci of postcontrast enhancement. Gallbladder is decompressed. No biliary ductal dilation. Pancreas: No pancreatic ductal dilation or evidence of acute inflammation. Spleen: No splenomegaly or focal splenic lesion. Adrenals/Urinary Tract: Bilateral adrenal glands appear normal. No hydronephrosis. Nonobstructive 4 mm stone in the mid ureter at the L4-L5 vertebral body level. Additional punctate nonobstructive left-greater-than-right renal  stones. Urinary bladder is unremarkable for degree of distension. Stomach/Bowel: No radiopaque enteric contrast material was administered. Small hiatal hernia otherwise the stomach is unremarkable for degree of distension. No pathologic dilation of small or large bowel. The appendix and terminal ileum appear normal. No evidence of acute bowel inflammation. Vascular/Lymphatic: Aortic atherosclerosis. No pathologically enlarged abdominal or pelvic lymph nodes Reproductive: Prostate is unremarkable. Other: No significant abdominopelvic free fluid. Musculoskeletal: No acute osseous abnormality. IMPRESSION: 1. Nonobstructive 4 mm stone in the mid left ureter at the L4-L5 vertebral body level. 2. Additional punctate nonobstructive left-greater-than-right renal stones. 3. Hypodensity in the posterior right lobe of the liver measures 7.1 cm this appears to demonstrate peripheral nodular discontinuous foci of postcontrast enhancement and is favored to reflect a benign hepatic hemangioma. However, consider more definitive characterization with nonemergent hepatic protocol MRI with and without contrast. 4.  Aortic Atherosclerosis (ICD10-I70.0). Electronically Signed   By: Dahlia Bailiff M.D.   On: 04/29/2022  15:30    Procedures Procedures    Medications Ordered in ED Medications  iohexol (OMNIPAQUE) 300 MG/ML solution 100 mL (100 mLs Intravenous Contrast Given 04/29/22 1512)    ED Course/ Medical Decision Making/ A&P    46 year old here for evaluation of left lower quadrant Donnell pain over the last week.  Pain intermittent.  Initially relieved with OTC medications however continues to have intermittent pain.  No urinary symptoms.  Does admit to some constipation however has this at baseline.  No fevers.  No sick contacts.  Passing flatus, no history of SBO. He does not want anything for pain at this time. No prior colonoscopy or hx of diverticulitis.   Labs and imaging personally viewed and interpreted:  CBC  without leukocytosis, hemoglobin 15.6 Metabolic panel glucose 111 UA negative for infection however does show blood Lipase 26 CT AP 4 mm left ureter stone.  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  I discussed follow-up with PCP for this for further evaluation with MRI.  Patient reassessed.  His pain has been controlled.  Has not needed any medication here in the emergency department.  Will DC home symptomatic management.  Encouraged to follow-up closely with urology if his symptoms do not improve, return for worsening symptoms.  On repeat exam patient does not have a surgical abdomin and there are no peritoneal signs.  No indication of appendicitis, bowel obstruction, bowel perforation, cholecystitis, diverticulitis.   The patient has been appropriately medically screened and/or stabilized in the ED. I have low suspicion for any other emergent medical condition which would require further screening, evaluation or treatment in the ED or require inpatient management.  Patient is hemodynamically stable and in no acute distress.  Patient able to ambulate in department prior to ED.  Evaluation does not show acute pathology that would require ongoing or additional emergent interventions while in the emergency department or further inpatient treatment.  I have discussed the diagnosis with the patient and answered all questions.  Pain is been managed while in the emergency department and patient has no further complaints prior to discharge.  Patient is comfortable with plan discussed in room and is stable for discharge at this time.  I have discussed strict return precautions for returning to the emergency department.  Patient was encouraged to follow-up with PCP/specialist refer to at discharge.                           Medical Decision Making Amount and/or Complexity of Data Reviewed Independent Historian: spouse External Data Reviewed: labs, radiology and notes. Labs: ordered.  Decision-making details documented in ED Course. Radiology: ordered and independent interpretation performed. Decision-making details documented in ED Course.  Risk OTC drugs. Prescription drug management. Decision regarding hospitalization. Diagnosis or treatment significantly limited by social determinants of health.          Final Clinical Impression(s) / ED Diagnoses Final diagnoses:  Ureteral stone    Rx / DC Orders ED Discharge Orders          Ordered    oxyCODONE-acetaminophen (PERCOCET/ROXICET) 5-325 MG tablet  Every 6 hours PRN        04/29/22 1542    tamsulosin (FLOMAX) 0.4 MG CAPS capsule  Daily        04/29/22 1542    ondansetron (ZOFRAN-ODT) 4 MG disintegrating tablet  Every 8 hours PRN        04/29/22 1542  Aarya Quebedeaux A, PA-C 04/29/22 1604    Pricilla Loveless, MD 05/03/22 [phone]

## 2022-04-29 NOTE — Discharge Instructions (Addendum)
Take medication as prescribed.  Please use caution as the medication provided to you today, Percocet is a controlled substance.  Can make you sleepy.  Do not make life or that decisions while taking this medication.  As discussed in the room the Flomax medication can make you lightheaded and dizzy when you go from sitting to standing.  Make sure to dangle your feet on the edge of the bed.  I have also written you for follow-up with urology, call to make and appointment  Return for new or worsening symptoms such as fever, inability to urinate, severe pain or vomiting uncontrolled with medications provided today

## 2022-05-05 ENCOUNTER — Other Ambulatory Visit: Payer: Self-pay

## 2022-05-05 DIAGNOSIS — N201 Calculus of ureter: Secondary | ICD-10-CM | POA: Insufficient documentation

## 2022-05-05 NOTE — ED Triage Notes (Signed)
Patient c/o left flank pain and urinary frequency.  Patient reports he has passed one kidney stone.  Patient reports he has also had vomiting, unrelieved by zofran.  Patient took his prescribed pain meds PTA.

## 2022-05-06 ENCOUNTER — Encounter (HOSPITAL_BASED_OUTPATIENT_CLINIC_OR_DEPARTMENT_OTHER): Payer: Self-pay | Admitting: Emergency Medicine

## 2022-05-06 ENCOUNTER — Emergency Department (HOSPITAL_BASED_OUTPATIENT_CLINIC_OR_DEPARTMENT_OTHER)
Admission: EM | Admit: 2022-05-06 | Discharge: 2022-05-06 | Disposition: A | Discharge status: Home / Self Care | Payer: Self-pay | Attending: Emergency Medicine | Admitting: Emergency Medicine

## 2022-05-06 DIAGNOSIS — N23 Unspecified renal colic: Secondary | ICD-10-CM

## 2022-05-06 HISTORY — DX: Calculus of kidney: N20.0

## 2022-05-06 LAB — URINALYSIS, ROUTINE W REFLEX MICROSCOPIC
Bilirubin Urine: NEGATIVE
Glucose, UA: NEGATIVE mg/dL
Ketones, ur: NEGATIVE mg/dL
Leukocytes,Ua: NEGATIVE
Nitrite: NEGATIVE
Protein, ur: NEGATIVE mg/dL
Specific Gravity, Urine: 1.014 (ref 1.005–1.030)
pH: 5 (ref 5.0–8.0)

## 2022-05-06 MED ORDER — KETOROLAC TROMETHAMINE 15 MG/ML IJ SOLN
15.0000 mg | Freq: Once | INTRAMUSCULAR | Status: AC
  Administered 2022-05-06: 15 mg via INTRAVENOUS
  Filled 2022-05-06: qty 1

## 2022-05-06 MED ORDER — HYDROMORPHONE HCL 1 MG/ML IJ SOLN
1.0000 mg | Freq: Once | INTRAMUSCULAR | Status: AC
  Administered 2022-05-06: 1 mg via INTRAVENOUS
  Filled 2022-05-06: qty 1

## 2022-05-06 MED ORDER — ONDANSETRON HCL 4 MG/2ML IJ SOLN
4.0000 mg | Freq: Once | INTRAMUSCULAR | Status: AC
  Administered 2022-05-06: 4 mg via INTRAVENOUS
  Filled 2022-05-06: qty 2

## 2022-05-06 MED ORDER — OXYCODONE-ACETAMINOPHEN 5-325 MG PO TABS: 1.0000 | ORAL_TABLET | Freq: Four times a day (QID) | ORAL | 0 refills | Status: DC | PRN

## 2022-05-06 NOTE — ED Provider Notes (Signed)
[facility] EMERGENCY Provider Note: Georgena Spurling, MD, FACEP  CSN: [id number] MRN: [id number] ARRIVAL: 05/05/22 at [place]: [id number]   CHIEF COMPLAINT  Flank Pain   HISTORY OF PRESENT ILLNESS  05/06/22 1:23 AM Grant [name] is a 46 y.o. male who was seen on 04/29/2022 for left flank pain.  He was diagnosed with a nonobstructive 4 mm stone in the mid left ureter.  He was also noted to have multiple punctate stones in his kidneys bilaterally.  He states he passed a stone 3 days ago.  2 days ago he developed some mild pressure in his left lower quadrant.  This acutely worsened yesterday evening about 9 PM and became severe.  He rated his pain as a 9 out of 10 at its worst.  It has eased slightly since arrival here.  He was nauseated and unable to keep down a Zofran tablet due to vomiting.  The pain is not worse with movement or palpation.   Past Medical History:  Diagnosis Date   Kidney stones     Past Surgical History:  Procedure Laterality Date   APPENDECTOMY     TONSILLECTOMY      History reviewed. No pertinent family history.  Social History   Tobacco Use   Smoking status: Never   Smokeless tobacco: Never  Substance Use Topics   Alcohol use: Yes    Comment: Seldom   Drug use: Not Currently    Prior to Admission medications   Medication Sig Start Date End Date Taking? Authorizing Provider  ondansetron (ZOFRAN-ODT) 4 MG disintegrating tablet Dissolve 1 tablet under the tongue every 8 (eight) hours as needed for nausea or vomiting. 04/29/22   Henderly, Britni A, PA-C  oxyCODONE-acetaminophen (PERCOCET/ROXICET) 5-325 MG tablet Take 1-2 tablets by mouth every 6 (six) hours as needed for severe pain. 05/06/22   Vannesa Abair, MD  tamsulosin (FLOMAX) 0.4 MG CAPS capsule Take 1 capsule (0.4 mg total) by mouth daily. 04/29/22   Henderly, Britni A, PA-C    Allergies Patient has no known allergies.   REVIEW OF SYSTEMS  Negative except as noted here or in the History of  Present Illness.   PHYSICAL EXAMINATION  Initial Vital Signs Blood pressure 122/65, pulse 82, temperature 97.8 F (36.6 C), temperature source Oral, resp. rate 17, height 6\' 1"  (1.854 m), weight (!) 158.8 kg, SpO2 96 %.  Examination General: Well-developed, high BMI male in no acute distress; appearance consistent with age of record HENT: normocephalic; atraumatic Eyes: Normal appearance Neck: supple Heart: regular rate and rhythm Lungs: clear to auscultation bilaterally Abdomen: soft; nondistended; nontender; bowel sounds present GU: No CVA tenderness Extremities: No deformity; full range of motion; pulses normal Neurologic: Awake, alert and oriented; motor function intact in all extremities and symmetric; no facial droop Skin: Warm and dry Psychiatric: Normal mood and affect   RESULTS  Summary of this visit's results, reviewed and interpreted by myself:   EKG Interpretation  Date/Time:    Ventricular Rate:    PR Interval:    QRS Duration:   QT Interval:    QTC Calculation:   R Axis:     Text Interpretation:         Laboratory Studies: Results for orders placed or performed during the hospital encounter of 05/06/22 (from the past 24 hour(s))  Urinalysis, Routine w reflex microscopic Urine, Clean Catch     Status: Abnormal   Collection Time: 05/06/22  1:31 AM  Result Value Ref Range   Color, Urine  YELLOW YELLOW   APPearance CLEAR CLEAR   Specific Gravity, Urine 1.014 1.005 - 1.030   pH 5.0 5.0 - 8.0   Glucose, UA NEGATIVE NEGATIVE mg/dL   Hgb urine dipstick LARGE (A) NEGATIVE   Bilirubin Urine NEGATIVE NEGATIVE   Ketones, ur NEGATIVE NEGATIVE mg/dL   Protein, ur NEGATIVE NEGATIVE mg/dL   Nitrite NEGATIVE NEGATIVE   Leukocytes,Ua NEGATIVE NEGATIVE   RBC / HPF 21-50 0 - 5 RBC/hpf   WBC, UA 0-5 0 - 5 WBC/hpf   Squamous Epithelial / LPF 0-5 0 - 5   Mucus PRESENT    Imaging Studies: No results found.  ED COURSE and MDM  Nursing notes, initial and subsequent  vitals signs, including pulse oximetry, reviewed and interpreted by myself.  Vitals:   05/05/22 2358 05/06/22 0100 05/06/22 0200  BP: (!) 148/96 122/65 111/60  Pulse: 87 82 76  Resp: 18 17 16   Temp: 97.8 F (36.6 C)    TempSrc: Oral    SpO2: 97% 96% 94%  Weight: (!) 158.8 kg    Height: 6\' 1"  (1.854 m)     Medications  ondansetron (ZOFRAN) injection 4 mg (4 mg Intravenous Given 05/06/22 0135)  HYDROmorphone (DILAUDID) injection 1 mg (1 mg Intravenous Given 05/06/22 0134)  ketorolac (TORADOL) 15 MG/ML injection 15 mg (15 mg Intravenous Given 05/06/22 0134)   2:07 AM Patient pain-free at this time.  I suspect he was or is passing another stone.  The previous stone was noted to be 4 mm and he passed that 3 days ago.  The other known stones were punctate and I suspect he will pass this quickly as well.  We will refill his prescriptions.   PROCEDURES  Procedures   ED DIAGNOSES     ICD-10-CM   1. Ureteral colic  N23          Shilah Hefel, MD 05/06/22 [phone]

## 2022-07-12 ENCOUNTER — Emergency Department: Payer: Self-pay

## 2022-07-12 ENCOUNTER — Encounter: Payer: Self-pay | Admitting: Emergency Medicine

## 2022-07-12 ENCOUNTER — Emergency Department
Admission: EM | Admit: 2022-07-12 | Discharge: 2022-07-12 | Disposition: A | Discharge status: Home / Self Care | Payer: Self-pay | Attending: Emergency Medicine | Admitting: Emergency Medicine

## 2022-07-12 ENCOUNTER — Other Ambulatory Visit: Payer: Self-pay

## 2022-07-12 DIAGNOSIS — Z1152 Encounter for screening for COVID-19: Secondary | ICD-10-CM | POA: Insufficient documentation

## 2022-07-12 DIAGNOSIS — R079 Chest pain, unspecified: Secondary | ICD-10-CM | POA: Insufficient documentation

## 2022-07-12 LAB — TROPONIN I (HIGH SENSITIVITY)
Troponin I (High Sensitivity): 23 ng/L — ABNORMAL HIGH (ref <18)
Troponin I (High Sensitivity): 23 ng/L — ABNORMAL HIGH (ref <18)

## 2022-07-12 LAB — CBC
HCT: 47 % (ref 39.0–52.0)
Hemoglobin: 15.2 g/dL (ref 13.0–17.0)
MCH: 27.4 pg (ref 26.0–34.0)
MCHC: 32.3 g/dL (ref 30.0–36.0)
MCV: 84.7 fL (ref 80.0–100.0)
Platelets: 223 10*3/uL (ref 150–400)
RBC: 5.55 MIL/uL (ref 4.22–5.81)
RDW: 12.6 % (ref 11.5–15.5)
WBC: 7.3 10*3/uL (ref 4.0–10.5)
nRBC: 0 % (ref 0.0–0.2)

## 2022-07-12 LAB — COMPREHENSIVE METABOLIC PANEL
ALT: 23 U/L (ref 0–44)
AST: 21 U/L (ref 15–41)
Albumin: 3.8 g/dL (ref 3.5–5.0)
Alkaline Phosphatase: 70 U/L (ref 38–126)
Anion gap: 6 (ref 5–15)
BUN: 10 mg/dL (ref 6–20)
CO2: 27 mmol/L (ref 22–32)
Calcium: 9.4 mg/dL (ref 8.9–10.3)
Chloride: 106 mmol/L (ref 98–111)
Creatinine, Ser: 0.85 mg/dL (ref 0.61–1.24)
GFR, Estimated: >60 mL/min (ref >60)
Glucose, Bld: 127 mg/dL — ABNORMAL HIGH (ref 70–99)
Potassium: 4.1 mmol/L (ref 3.5–5.1)
Sodium: 139 mmol/L (ref 135–145)
Total Bilirubin: 1 mg/dL (ref 0.3–1.2)
Total Protein: 6.9 g/dL (ref 6.5–8.1)

## 2022-07-12 LAB — RESP PANEL BY RT-PCR (RSV, FLU A&B, COVID)  RVPGX2
Influenza A by PCR: NEGATIVE
Influenza B by PCR: NEGATIVE
Resp Syncytial Virus by PCR: NEGATIVE
SARS Coronavirus 2 by RT PCR: NEGATIVE

## 2022-07-12 LAB — D-DIMER, QUANTITATIVE: D-Dimer, Quant: 0.28 ug/mL-FEU (ref 0.00–0.50)

## 2022-07-12 MED ORDER — ACETAMINOPHEN 500 MG PO TABS
1000.0000 mg | ORAL_TABLET | Freq: Once | ORAL | Status: AC
  Administered 2022-07-12: 1000 mg via ORAL
  Filled 2022-07-12: qty 2

## 2022-07-12 NOTE — ED Triage Notes (Signed)
PT in with co chest pain that started this am, denies any cardiac history or recent illness.

## 2022-07-12 NOTE — ED Provider Notes (Signed)
[facility] Provider Note    Event Date/Time   First MD Initiated Contact with Patient 07/12/22 [phone]     (approximate)   History   Chief Complaint: Chest Pain   HPI  Grant [name] is a 46 y.o. male with no known past medical history who comes ED complaining of central chest pain, described as dull ache that lasted for 5 minutes, onset during sexual intercourse.  Associated with shortness of breath.  No diaphoresis.  Resolved with rest.  Nonradiating.  Currently feels asymptomatic.  This is never happened before.  No known prior heart issues.  Does not have a cardiologist.  Only symptom currently is a generalized headache which was not thunderclap in onset and feels throbbing.     Physical Exam   Triage Vital Signs: ED Triage Vitals  Enc Vitals Group     BP 07/12/22 0816 (!) 125/93     Pulse Rate 07/12/22 0816 (!) 108     Resp 07/12/22 0816 20     Temp 07/12/22 0816 98.5 F (36.9 C)     Temp Source 07/12/22 0816 Oral     SpO2 07/12/22 0816 95 %     Weight 07/12/22 0813 (!) 345 lb (156.5 kg)     Height 07/12/22 0813 6' (1.829 m)     Head Circumference --      Peak Flow --      Pain Score 07/12/22 0813 6     Pain Loc --      Pain Edu? --      Excl. in GC? --     Most recent vital signs: Vitals:   07/12/22 0816 07/12/22 0935  BP: (!) 125/93 (!) 120/90  Pulse: (!) 108 100  Resp: 20 18  Temp: 98.5 F (36.9 C)   SpO2: 95% 95%    General: Awake, no distress.  CV:  Good peripheral perfusion.  Tachycardia, heart rate 100.  No murmurs Resp:  Normal effort.  Clear to auscultation bilaterally Abd:  No distention.  Soft nontender Other:  No lower extremity edema.  Thyroid nonpalpable.  Moist oral mucosa   ED Results / Procedures / Treatments   Labs (all labs ordered are listed, but only abnormal results are displayed) Labs Reviewed  COMPREHENSIVE METABOLIC PANEL - Abnormal; Notable for the following components:      Result Value    Glucose, Bld 127 (*)    All other components within normal limits  TROPONIN I (HIGH SENSITIVITY) - Abnormal; Notable for the following components:   Troponin I (High Sensitivity) 23 (*)    All other components within normal limits  TROPONIN I (HIGH SENSITIVITY) - Abnormal; Notable for the following components:   Troponin I (High Sensitivity) 23 (*)    All other components within normal limits  RESP PANEL BY RT-PCR (RSV, FLU A&B, COVID)  RVPGX2  CBC  D-DIMER, QUANTITATIVE     EKG Interpreted by me Sinus tachycardia rate 109.  Rightward axis, normal intervals.  S1Q3T3 pattern.  No acute ischemic changes.   RADIOLOGY Chest x-ray interpreted by me, appears normal.  Radiology report reviewed   PROCEDURES:  Procedures   MEDICATIONS ORDERED IN ED: Medications  acetaminophen (TYLENOL) tablet 1,000 mg (1,000 mg Oral Given 07/12/22 0923)     IMPRESSION / MDM / ASSESSMENT AND PLAN / ED COURSE  I reviewed the triage vital signs and the nursing notes.  Differential diagnosis includes, but is not limited to, angina pectoralis, non-STEMI, pulmonary embolism, viral illness  Patient's presentation is most consistent with acute presentation with potential threat to life or bodily function.     Clinical Course as of 07/12/22 1228  Mon Jul 12, 2022  3662 Patient presents with episode of dull chest ache and shortness of breath that lasted for 5 minutes precipitated by sexual intercourse this morning.  Resolved after resting.  Has not taken any nitrates today.  Currently asymptomatic.  He presents with mild tachycardia, initial labs show troponin slightly elevated at 23.  EKG shows S1Q3T3 pattern.  Symptoms are not consistent with PE, and clinically presentation is more suspicious for angina pectoralis.  Will repeat troponin, obtain D-dimer.  Tylenol for headache. [PS]    Clinical Course User Index [PS] Sharman Cheek, MD     ----------------------------------------- 12:28 PM on 07/12/2022 ----------------------------------------- Remains asymptomatic in the emergency department.  Repeat troponin is unchanged.  D-dimer is normal.  Viral swab negative.  Will refer to urgent cardiology follow-up for further evaluation.  Considered hospitalization but with resolution of his symptoms after a brief period and reassuring workup, I think this is not warranted.   FINAL CLINICAL IMPRESSION(S) / ED DIAGNOSES   Final diagnoses:  Nonspecific chest pain     Rx / DC Orders   ED Discharge Orders          Ordered    Ambulatory referral to Cardiology       Comments: If you have not heard from the Cardiology office within the next 72 hours please call [phone].   07/12/22 1225             Note:  This document was prepared using Dragon voice recognition software and may include unintentional dictation errors.   Sharman Cheek, MD 07/12/22 1229

## 2022-07-12 NOTE — Discharge Instructions (Signed)
Your chest x-ray and lab tests today were all okay.  Please follow-up with cardiology clinic for further evaluation of your symptoms.  Avoid strenuous activity until you see cardiology.

## 2022-08-24 ENCOUNTER — Emergency Department (HOSPITAL_BASED_OUTPATIENT_CLINIC_OR_DEPARTMENT_OTHER)
Admission: EM | Admit: 2022-08-24 | Discharge: 2022-08-24 | Disposition: A | Discharge status: Home / Self Care | Payer: Self-pay | Attending: Emergency Medicine | Admitting: Emergency Medicine

## 2022-08-24 ENCOUNTER — Other Ambulatory Visit: Payer: Self-pay

## 2022-08-24 ENCOUNTER — Emergency Department (HOSPITAL_BASED_OUTPATIENT_CLINIC_OR_DEPARTMENT_OTHER): Payer: Self-pay

## 2022-08-24 ENCOUNTER — Encounter (HOSPITAL_BASED_OUTPATIENT_CLINIC_OR_DEPARTMENT_OTHER): Payer: Self-pay | Admitting: Emergency Medicine

## 2022-08-24 DIAGNOSIS — Z20822 Contact with and (suspected) exposure to covid-19: Secondary | ICD-10-CM | POA: Insufficient documentation

## 2022-08-24 DIAGNOSIS — D72829 Elevated white blood cell count, unspecified: Secondary | ICD-10-CM | POA: Insufficient documentation

## 2022-08-24 DIAGNOSIS — R000 Tachycardia, unspecified: Secondary | ICD-10-CM | POA: Insufficient documentation

## 2022-08-24 DIAGNOSIS — J189 Pneumonia, unspecified organism: Secondary | ICD-10-CM | POA: Insufficient documentation

## 2022-08-24 DIAGNOSIS — R739 Hyperglycemia, unspecified: Secondary | ICD-10-CM | POA: Insufficient documentation

## 2022-08-24 LAB — CBC
HCT: 46.5 % (ref 39.0–52.0)
Hemoglobin: 15.1 g/dL (ref 13.0–17.0)
MCH: 27.4 pg (ref 26.0–34.0)
MCHC: 32.5 g/dL (ref 30.0–36.0)
MCV: 84.4 fL (ref 80.0–100.0)
Platelets: 209 10*3/uL (ref 150–400)
RBC: 5.51 MIL/uL (ref 4.22–5.81)
RDW: 12.7 % (ref 11.5–15.5)
WBC: 15.5 10*3/uL — ABNORMAL HIGH (ref 4.0–10.5)
nRBC: 0 % (ref 0.0–0.2)

## 2022-08-24 LAB — COMPREHENSIVE METABOLIC PANEL
ALT: 24 U/L (ref 0–44)
AST: 16 U/L (ref 15–41)
Albumin: 4.2 g/dL (ref 3.5–5.0)
Alkaline Phosphatase: 63 U/L (ref 38–126)
Anion gap: 6 (ref 5–15)
BUN: 10 mg/dL (ref 6–20)
CO2: 29 mmol/L (ref 22–32)
Calcium: 9.5 mg/dL (ref 8.9–10.3)
Chloride: 104 mmol/L (ref 98–111)
Creatinine, Ser: 0.99 mg/dL (ref 0.61–1.24)
GFR, Estimated: >60 mL/min (ref >60)
Glucose, Bld: 115 mg/dL — ABNORMAL HIGH (ref 70–99)
Potassium: 4.7 mmol/L (ref 3.5–5.1)
Sodium: 139 mmol/L (ref 135–145)
Total Bilirubin: 1 mg/dL (ref 0.3–1.2)
Total Protein: 6.8 g/dL (ref 6.5–8.1)

## 2022-08-24 LAB — RESP PANEL BY RT-PCR (RSV, FLU A&B, COVID)  RVPGX2
Influenza A by PCR: NEGATIVE
Influenza B by PCR: NEGATIVE
Resp Syncytial Virus by PCR: NEGATIVE
SARS Coronavirus 2 by RT PCR: NEGATIVE

## 2022-08-24 LAB — LIPASE, BLOOD: Lipase: 12 U/L (ref 11–51)

## 2022-08-24 LAB — TROPONIN I (HIGH SENSITIVITY): Troponin I (High Sensitivity): 14 ng/L (ref <18)

## 2022-08-24 MED ORDER — SODIUM CHLORIDE 0.9 % IV BOLUS
1000.0000 mL | Freq: Once | INTRAVENOUS | Status: AC
  Administered 2022-08-24: 1000 mL via INTRAVENOUS

## 2022-08-24 MED ORDER — DOXYCYCLINE HYCLATE 100 MG PO CAPS: 100.0000 mg | ORAL_CAPSULE | Freq: Two times a day (BID) | ORAL | 0 refills | Status: DC

## 2022-08-24 MED ORDER — SODIUM CHLORIDE 0.9 % IV SOLN
1.0000 g | Freq: Once | INTRAVENOUS | Status: AC
  Administered 2022-08-24: 1 g via INTRAVENOUS
  Filled 2022-08-24: qty 10

## 2022-08-24 NOTE — Discharge Instructions (Signed)
Contact a health care provider if: You have a fever. You have trouble sleeping because you cannot control your cough with cough medicine. Get help right away if: Your shortness of breath becomes worse. Your chest pain increases. Your sickness becomes worse, especially if you are an older adult or have a weak immune system. You cough up blood. These symptoms may be an emergency. Get help right away. Call 911. Do not wait to see if the symptoms will go away. Do not drive yourself to the hospital.

## 2022-08-24 NOTE — ED Triage Notes (Signed)
Woe up this am emesis , chills , cough , fever .

## 2022-08-24 NOTE — ED Provider Notes (Signed)
[name] Provider Note   CSN: [id number] Arrival date & time: 08/24/22  2440     History  Chief Complaint  Patient presents with   Emesis    Grant [name] is a 47 y.o. male past medical history of obesity and kidney stones who presents emergency department chief complaint of flulike symptoms.  He had onset of flulike symptoms beginning this morning.  Patient complains of nausea, vomiting, temporal fever of 101.6.  He denies flank pain, abdominal pain, diarrhea, urinary symptoms.  He has no cough or runny nose.  He denies any known exposure to sick contacts however in the community at this time is epidemic COVID-19, RSV and influenza.  He has no other complaints at this time.  He took antipyretics for his fever which has resolved.  He states that he always gets high heart rate when he is at the doctors because he "gets nervous.   Emesis      Home Medications Prior to Admission medications   Medication Sig Start Date End Date Taking? Authorizing Provider  doxycycline (VIBRAMYCIN) 100 MG capsule Take 1 capsule (100 mg total) by mouth 2 (two) times daily. 08/24/22  Yes Doloros Kwolek, PA-C  ondansetron (ZOFRAN-ODT) 4 MG disintegrating tablet Dissolve 1 tablet under the tongue every 8 (eight) hours as needed for nausea or vomiting. 04/29/22   Henderly, Britni A, PA-C  oxyCODONE-acetaminophen (PERCOCET/ROXICET) 5-325 MG tablet Take 1-2 tablets by mouth every 6 (six) hours as needed for severe pain. 05/06/22   Molpus, John, MD  tamsulosin (FLOMAX) 0.4 MG CAPS capsule Take 1 capsule (0.4 mg total) by mouth daily. 04/29/22   Henderly, Britni A, PA-C      Allergies    Patient has no known allergies.    Review of Systems   Review of Systems  Gastrointestinal:  Positive for vomiting.    Physical Exam Updated Vital Signs BP 122/87   Pulse 94   Temp 98.1 F (36.7 C)   Resp 12   Wt (!) 156.5 kg   SpO2 98%   BMI 46.79 kg/m  Physical  Exam Vitals and nursing note reviewed.  Constitutional:      General: He is not in acute distress.    Appearance: He is well-developed. He is not diaphoretic.  HENT:     Head: Normocephalic and atraumatic.     Mouth/Throat:     Mouth: Mucous membranes are moist.     Pharynx: No posterior oropharyngeal erythema.  Eyes:     General: No scleral icterus.    Extraocular Movements: Extraocular movements intact.     Conjunctiva/sclera: Conjunctivae normal.     Pupils: Pupils are equal, round, and reactive to light.  Cardiovascular:     Rate and Rhythm: Regular rhythm. Tachycardia present.     Heart sounds: Normal heart sounds.     Comments: Occasional skipped beats on auscultation Pulmonary:     Effort: Pulmonary effort is normal. No respiratory distress.     Breath sounds: Normal breath sounds.  Abdominal:     Palpations: Abdomen is soft.     Tenderness: There is no abdominal tenderness.  Musculoskeletal:     Cervical back: Normal range of motion and neck supple.  Skin:    General: Skin is warm and dry.  Neurological:     Mental Status: He is alert.  Psychiatric:        Behavior: Behavior normal.     ED Results / Procedures / Treatments  Labs (all labs ordered are listed, but only abnormal results are displayed) Labs Reviewed  CBC - Abnormal; Notable for the following components:      Result Value   WBC 15.5 (*)    All other components within normal limits  COMPREHENSIVE METABOLIC PANEL - Abnormal; Notable for the following components:   Glucose, Bld 115 (*)    All other components within normal limits  RESP PANEL BY RT-PCR (RSV, FLU A&B, COVID)  RVPGX2  LIPASE, BLOOD  TROPONIN I (HIGH SENSITIVITY)    EKG None  Radiology DG Chest Port 1 View  Result Date: 08/24/2022 CLINICAL DATA:  Fever, cough. EXAM: PORTABLE CHEST 1 VIEW COMPARISON:  July 12, 2022. FINDINGS: The heart size and mediastinal contours are within normal limits. Right lung is clear. Minimal left  basilar opacity is noted concerning for subsegmental atelectasis or possibly infiltrate. The visualized skeletal structures are unremarkable. IMPRESSION: Minimal left basilar subsegmental atelectasis or infiltrate. Electronically Signed   By: Marijo Conception M.D.   On: 08/24/2022 10:29    Procedures Procedures    Medications Ordered in ED Medications  sodium chloride 0.9 % bolus 1,000 mL (0 mLs Intravenous Stopped 08/24/22 1343)  cefTRIAXone (ROCEPHIN) 1 g in sodium chloride 0.9 % 100 mL IVPB (0 g Intravenous Stopped 08/24/22 1338)    ED Course/ Medical Decision Making/ A&P Clinical Course as of 08/25/22 1728  Tue Aug 24, 2022  1221 DG Chest [place] 1 View Patient appears to have left basilar infiltrate which may represent pneumonia.  Given his symptoms we will treat as such with Rocephin and doxycycline. [AH]  1221 Troponin I (High Sensitivity) [AH]  1221 Resp panel by RT-PCR (RSV, Flu A&B, Covid) Anterior Nasal Swab [AH]  1221 WBC(!): 15.5 [AH]  1221 Lipase, blood [AH]  1221 Glucose(!): 115 [AH]    Clinical Course User Index [AH] Margarita Mail, PA-C                             Medical Decision Making Patient presents with flu like sxs, cough and vomiting, Differential diagnosis for emergent cause of cough includes but is not limited to upper respiratory infection, lower respiratory infection, allergies, asthma, irritants, foreign body, medications such as ACE inhibitors, reflux, asthma, CHF, lung cancer, interstitial lung disease, psychiatric causes, postnasal drip and postinfectious bronchospasm.  After review of all data points patient appears to have CAP.   Patient given rocephin and antiemetics. HDS  Will discharge with doxycycline. Discussed reutrn precautions,   Amount and/or Complexity of Data Reviewed Independent Historian: spouse Labs: ordered. Decision-making details documented in ED Course.    Details: Leukocytosis and hyperglycemia likely acute phase  reactant Radiology: ordered and independent interpretation performed. Decision-making details documented in ED Course.    Details: Left basilar inflitrate ECG/medicine tests: ordered and independent interpretation performed.    Details: NSR rate  98 with pvcs and early repol  Risk Prescription drug management.           Final Clinical Impression(s) / ED Diagnoses Final diagnoses:  Community acquired pneumonia of left lower lobe of lung    Rx / DC Orders ED Discharge Orders          Ordered    doxycycline (VIBRAMYCIN) 100 MG capsule  2 times daily        08/24/22 1314              Margarita Mail, [place] 08/25/22 1728  Elnora Morrison, MD 08/31/22 [phone]

## 2022-10-22 ENCOUNTER — Ambulatory Visit: Payer: BC Managed Care – PPO | Attending: Cardiovascular Disease | Admitting: Cardiovascular Disease

## 2022-10-22 ENCOUNTER — Encounter: Payer: Self-pay | Admitting: Cardiovascular Disease

## 2022-10-22 VITALS — BP 138/82 | HR 96 | Ht 73.0 in | Wt 357.0 lb

## 2022-10-22 DIAGNOSIS — R072 Precordial pain: Secondary | ICD-10-CM

## 2022-10-22 DIAGNOSIS — R400 Somnolence: Secondary | ICD-10-CM | POA: Diagnosis not present

## 2022-10-22 DIAGNOSIS — R0683 Snoring: Secondary | ICD-10-CM

## 2022-10-22 DIAGNOSIS — I700 Atherosclerosis of aorta: Secondary | ICD-10-CM

## 2022-10-22 DIAGNOSIS — R0602 Shortness of breath: Secondary | ICD-10-CM | POA: Diagnosis not present

## 2022-10-22 MED ORDER — METOPROLOL TARTRATE 100 MG PO TABS: 100.0000 mg | ORAL_TABLET | Freq: Once | ORAL | 0 refills | Status: DC

## 2022-10-22 MED ORDER — OMEPRAZOLE 40 MG PO CPDR: 40.0000 mg | DELAYED_RELEASE_CAPSULE | Freq: Every day | ORAL | 3 refills | Status: DC

## 2022-10-22 NOTE — Progress Notes (Signed)
Cardiology Office Note:    Date:  10/22/2022   ID:  Grant [name], DOB 01-16-1976, MRN [id number]  PCP:  Grant [name], No   [facility] Providers Cardiologist:  None     Referring MD: Grant Mew, MD   Chief Complaint  Patient presents with   Shortness of Breath   Chest Pain  Grant [name] is a 47 y.o. male who is being seen today for the evaluation of dyspnea and chest pain at the request of Grant Mew, MD.   History of Present Illness:    Grant [name] is a 47 y.o. male with a hx of morbid obesity and nephrolithiasis who has recently developed problems with dyspnea and chest heaviness.  He has not really had any regular follow-up with a physician in the last couple of years.  He is recently developed dyspnea and chest heaviness associated with sexual intercourse.  Most recently his symptoms persisted for as much as 30 minutes afterwards.  He was seen in the emergency room in December for these complaints.  His workup showed a minimally elevated and stable troponin level at 23.  Chest x-ray was unremarkable.  His ECG did show sinus tachycardia with rightward axis, incomplete right bundle branch block and S1Q3T3 pattern but no ischemic changes.  D-dimer was normal and angiography was not pursued.  (His ECG in August 2022 did show similar pattern of S1Q 3 without t wave inversion in lead III).  He was seen back in the emergency room 08/24/2022 with flulike symptoms, low-grade fever and elevated white blood cell count electrocardiogram was similar but showed several PACs.  He was very much unaware of palpitations despite reportedly having irregular rhythm physical exam.  The patient specifically denies any chest pain at rest, dyspnea at rest, orthopnea, paroxysmal nocturnal dyspnea, syncope, palpitations, focal neurological deficits, intermittent claudication, lower extremity edema, unexplained weight gain, cough, hemoptysis or wheezing.   He has severe snoring, his wife  reports frequent episodes of apnea, although he reports waking up feeling pretty refreshed in the morning.  He is unable to watch a movie or a ball game without falling asleep.  STOP-BANG score is 7.  He does not smoke or have diabetes mellitus.  He has been told that his lipid profile is "good".  He has normal renal function.  Aortic atherosclerosis was incidentally noted on a CT of the abdomen performed in October 2023 for kidney stones.  His father Grant [name] has a longstanding history of coronary artery disease (onset at age 48) and severe ischemic cardiomyopathy and has a defibrillator.  Past Medical History:  Diagnosis Date   Kidney stones     Past Surgical History:  Procedure Laterality Date   APPENDECTOMY     TONSILLECTOMY      Current Medications: Current Meds  Medication Sig   metoprolol tartrate (LOPRESSOR) 100 MG tablet Take 1 tablet (100 mg total) by mouth once for 1 dose. PLEASE TAKE METOPROLOL 2  HOURS PRIOR TO CTA SCAN.   omeprazole (PRILOSEC) 40 MG capsule Take 1 capsule (40 mg total) by mouth daily.   [DISCONTINUED] omeprazole (PRILOSEC OTC) 20 MG tablet Take 20 mg by mouth daily.     Allergies:   Patient has no known allergies.   Social History   Socioeconomic History   Marital status: Married    Spouse name: Not on file   Number of children: Not on file   Years of education: Not on file   Highest education level: Not on  file  Occupational History   Not on file  Tobacco Use   Smoking status: Never   Smokeless tobacco: Never  Substance and Sexual Activity   Alcohol use: Yes    Comment: Seldom   Drug use: Not Currently   Sexual activity: Not Currently  Other Topics Concern   Not on file  Social History Narrative   Not on file   Social Determinants of Health   Financial Resource Strain: Not on file  Food Insecurity: Not on file  Transportation Needs: Not on file  Physical Activity: Not on file  Stress: Not on file  Social Connections: Not on file      Family History: The patient's father has early onset CAD at age 67 with myocardial infarction, congestive heart failure and implantable defibrillator.  ROS:   Please see the history of present illness.     All other systems reviewed and are negative.  EKGs/Labs/Other Studies Reviewed:    The following studies were reviewed today: Notes, labs, ECG tracings and x-rays from multiple recent hospital ED visits  EKG:  EKG is not ordered today.  The ekg ordered 08/24/2022 was personally reviewed and demonstrates sinus tachycardia with occasional PACs, incomplete right bundle branch block with QRS 109 ms, subtle nonspecific ST segment elevation in multiple leads, normal QTc 422 ms.  Recent Labs: 08/24/2022: ALT 24; BUN 10; Creatinine, Ser 0.99; Hemoglobin 15.1; Platelets 209; Potassium 4.7; Sodium 139  Recent Lipid Panel No results found for: "CHOL", "TRIG", "HDL", "CHOLHDL", "VLDL", "LDLCALC", "LDLDIRECT"   Risk Assessment/Calculations:           STOP-Bang Score:  7       Physical Exam:    VS:  BP 138/82   Pulse 96   Ht 6\' 1"  (1.854 m)   Wt (!) 357 lb (161.9 kg)   SpO2 98%   BMI 47.10 kg/m     Wt Readings from Last 3 Encounters:  10/22/22 (!) 357 lb (161.9 kg)  08/24/22 (!) 345 lb (156.5 kg)  07/12/22 (!) 345 lb (156.5 kg)     GEN: Morbidly obese, well nourished, well developed in no acute distress HEENT: Normal he has relative retrognathia, broad neck, crowded oropharynx NECK: No JVD; No carotid bruits LYMPHATICS: No lymphadenopathy CARDIAC: RRR, no murmurs, rubs, gallops RESPIRATORY:  Clear to auscultation without rales, wheezing or rhonchi  ABDOMEN: Soft, non-tender, non-distended MUSCULOSKELETAL:  No edema; No deformity  SKIN: Warm and dry NEUROLOGIC:  Alert and oriented x 3 PSYCHIATRIC:  Normal affect   ASSESSMENT:    1. Precordial pain   2. Shortness of breath   3. Snoring   4. Daytime somnolence   5. Atherosclerosis of aorta (HCC)    PLAN:    In  order of problems listed above:  Chest pain: During physical activity, concerning for prolonged episode of angina pectoris.  Recommend coronary CT angiography. Exertional dyspnea: Multiple possible explanations, not the least of which is morbid obesity.  Strongly suspect unrecognized obstructive sleep apnea that will require treatment.  However, biggest concern is that he may have multivessel coronary artery disease .  Check echocardiogram to look for both left ventricular and right ventricular systolic function. OSA: Extremely likely diagnosis.  He does not have a smart phone for [place] to do a itamar home sleep study.  Will try to get him scheduled for a split-night study Aortic atherosclerosis: Incidentally noted on a CT that was performed when he had barely turned 47 years old, greatly increases suspicion for coronary  artery disease.  Need to get an updated lipid profile.  Suspect we will shoot for target LDL less than 70 based on findings on CT angiography. Morbid obesity: His wife has encouraged him to undergo bariatric surgery but he has no desire to do so.           Medication Adjustments/Labs and Tests Ordered: Current medicines are reviewed at length with the patient today.  Concerns regarding medicines are outlined above.  Orders Placed This Encounter  Procedures   CT CORONARY MORPH W/CTA COR W/SCORE W/CA W/CM &/OR WO/CM   Basic metabolic panel   ECHOCARDIOGRAM COMPLETE   Split night study   Meds ordered this encounter  Medications   metoprolol tartrate (LOPRESSOR) 100 MG tablet    Sig: Take 1 tablet (100 mg total) by mouth once for 1 dose. PLEASE TAKE METOPROLOL 2  HOURS PRIOR TO CTA SCAN.    Dispense:  1 tablet    Refill:  0   omeprazole (PRILOSEC) 40 MG capsule    Sig: Take 1 capsule (40 mg total) by mouth daily.    Dispense:  90 capsule    Refill:  3    Patient Instructions  Medication Instructions:  Metoprolol 100mg  2 hours prior to CTA *If you need a refill on your  cardiac medications before your next appointment, please call your pharmacy*   Lab Work: BMET- within 30 days of CTA If you have labs (blood work) drawn today and your tests are completely normal, you will receive your results only by: [place] (if you have MyChart) OR A paper copy in the mail If you have any lab test that is abnormal or we need to change your treatment, we will call you to review the results.   Testing/Procedures: Your physician has requested that you have an echocardiogram. Echocardiography is a painless test that uses sound waves to create images of your heart. It provides your doctor with information about the size and shape of your heart and how well your heart's chambers and valves are working. This procedure takes approximately one hour. There are no restrictions for this procedure. Please do NOT wear cologne, perfume, aftershave, or lotions (deodorant is allowed). Please arrive 15 minutes prior to your appointment time.   Your physician has requested that you have cardiac CT. Cardiac computed tomography (CT) is a painless test that uses an x-ray machine to take clear, detailed pictures of your heart. For further information please visit [email or web address]. Please follow instruction sheet as given.     Your cardiac CT will be scheduled at one of the below locations:   [facility] [address] [place], [place] [address] [phone]  If scheduled at [facility], please arrive at the [facility] and Children's Entrance (Entrance C2) of [facility] 30 minutes prior to test start time. You can use the FREE valet parking offered at entrance C (encouraged to control the heart rate for the test)  Proceed to the [facility] Radiology Department (first floor) to check-in and test prep.  All radiology patients and guests should use entrance C2 at [facility], accessed from [facility], even though the hospital's  physical address listed is [address].     Please follow these instructions carefully (unless otherwise directed):  Hold all erectile dysfunction medications at least 3 days (72 hrs) prior to test. (Ie viagra, cialis, sildenafil, tadalafil, etc) We will administer nitroglycerin during this exam.   On the Night  Before the Test: Be sure to Drink plenty of water. Do not consume any caffeinated/decaffeinated beverages or chocolate 12 hours prior to your test. Do not take any antihistamines 12 hours prior to your test.  On the Day of the Test: Drink plenty of water until 1 hour prior to the test. Do not eat any food 1 hour prior to test. You may take your regular medications prior to the test.  Take metoprolol (Lopressor) 100mg  two hours prior to test. If you take Furosemide/Hydrochlorothiazide/Spironolactone, please HOLD on the morning of the test.  After the Test: Drink plenty of water. After receiving IV contrast, you may experience a mild flushed feeling. This is normal. On occasion, you may experience a mild rash up to 24 hours after the test. This is not dangerous. If this occurs, you can take Benadryl 25 mg and increase your fluid intake. If you experience trouble breathing, this can be serious. If it is severe call 911 IMMEDIATELY. If it is mild, please call our office. If you take any of these medications: Glipizide/Metformin, Avandament, Glucavance, please do not take 48 hours after completing test unless otherwise instructed.  We will call to schedule your test 2-4 weeks out understanding that some insurance companies will need an authorization prior to the service being performed.   For non-scheduling related questions, please contact the cardiac imaging nurse navigator should you have any questions/concerns: Marchia Bond, Cardiac Imaging Nurse Navigator Gordy Clement, Cardiac Imaging Nurse Navigator [facility] Direct Office Dial:  [phone]   For scheduling needs, including cancellations and rescheduling, please call [place], [phone].    Follow-Up: At [facility], you and your health needs are our priority.  As part of our continuing mission to provide you with exceptional heart care, we have created designated Provider Care Teams.  These Care Teams include your primary Cardiologist (physician) and Advanced Practice Providers (APPs -  Physician Assistants and Nurse Practitioners) who all work together to provide you with the care you need, when you need it.  We recommend signing up for the patient portal called "MyChart".  Sign up information is provided on this After Visit Summary.  MyChart is used to connect with patients for Virtual Visits (Telemedicine).  Patients are able to view lab/test results, encounter notes, upcoming appointments, etc.  Non-urgent messages can be sent to your provider as well.   To learn more about what you can do with MyChart, go to [email or web address].    Your next appointment:   4 month(s)  Provider:   Dr Sallyanne Kuster      Signed, Sanda Klein, MD  10/22/2022 8:24 PM    [place]

## 2022-10-22 NOTE — Patient Instructions (Addendum)
Medication Instructions:  Metoprolol 100mg  2 hours prior to CTA *If you need a refill on your cardiac medications before your next appointment, please call your pharmacy*   Lab Work: BMET- within 30 days of CTA If you have labs (blood work) drawn today and your tests are completely normal, you will receive your results only by: [place] (if you have MyChart) OR A paper copy in the mail If you have any lab test that is abnormal or we need to change your treatment, we will call you to review the results.   Testing/Procedures: Your physician has requested that you have an echocardiogram. Echocardiography is a painless test that uses sound waves to create images of your heart. It provides your doctor with information about the size and shape of your heart and how well your heart's chambers and valves are working. This procedure takes approximately one hour. There are no restrictions for this procedure. Please do NOT wear cologne, perfume, aftershave, or lotions (deodorant is allowed). Please arrive 15 minutes prior to your appointment time.   Your physician has requested that you have cardiac CT. Cardiac computed tomography (CT) is a painless test that uses an x-ray machine to take clear, detailed pictures of your heart. For further information please visit [email or web address]. Please follow instruction sheet as given.     Your cardiac CT will be scheduled at one of the below locations:   [facility] [address] [place], [place] [address] [phone]  If scheduled at [facility], please arrive at the [facility] and Children's Entrance (Entrance C2) of [facility] 30 minutes prior to test start time. You can use the FREE valet parking offered at entrance C (encouraged to control the heart rate for the test)  Proceed to the [facility] Radiology Department (first floor) to check-in and test prep.  All radiology patients and guests should use entrance C2  at [facility], accessed from [facility], even though the hospital's physical address listed is [address].     Please follow these instructions carefully (unless otherwise directed):  Hold all erectile dysfunction medications at least 3 days (72 hrs) prior to test. (Ie viagra, cialis, sildenafil, tadalafil, etc) We will administer nitroglycerin during this exam.   On the Night Before the Test: Be sure to Drink plenty of water. Do not consume any caffeinated/decaffeinated beverages or chocolate 12 hours prior to your test. Do not take any antihistamines 12 hours prior to your test.  On the Day of the Test: Drink plenty of water until 1 hour prior to the test. Do not eat any food 1 hour prior to test. You may take your regular medications prior to the test.  Take metoprolol (Lopressor) 100mg  two hours prior to test. If you take Furosemide/Hydrochlorothiazide/Spironolactone, please HOLD on the morning of the test.  After the Test: Drink plenty of water. After receiving IV contrast, you may experience a mild flushed feeling. This is normal. On occasion, you may experience a mild rash up to 24 hours after the test. This is not dangerous. If this occurs, you can take Benadryl 25 mg and increase your fluid intake. If you experience trouble breathing, this can be serious. If it is severe call 911 IMMEDIATELY. If it is mild, please call our office. If you take any of these medications: Glipizide/Metformin, Avandament, Glucavance, please do not take 48 hours after completing test unless otherwise instructed.  We will call to schedule your test 2-4 weeks  out understanding that some insurance companies will need an authorization prior to the service being performed.   For non-scheduling related questions, please contact the cardiac imaging nurse navigator should you have any questions/concerns: Marchia Bond, Cardiac Imaging Nurse Navigator Gordy Clement, Cardiac  Imaging Nurse Navigator [facility] Direct Office Dial: [phone]   For scheduling needs, including cancellations and rescheduling, please call [place], [phone].    Follow-Up: At [facility], you and your health needs are our priority.  As part of our continuing mission to provide you with exceptional heart care, we have created designated Provider Care Teams.  These Care Teams include your primary Cardiologist (physician) and Advanced Practice Providers (APPs -  Physician Assistants and Nurse Practitioners) who all work together to provide you with the care you need, when you need it.  We recommend signing up for the patient portal called "MyChart".  Sign up information is provided on this After Visit Summary.  MyChart is used to connect with patients for Virtual Visits (Telemedicine).  Patients are able to view lab/test results, encounter notes, upcoming appointments, etc.  Non-urgent messages can be sent to your provider as well.   To learn more about what you can do with MyChart, go to [email or web address].    Your next appointment:   4 month(s)  Provider:   Dr Sallyanne Kuster

## 2022-10-22 NOTE — Progress Notes (Signed)
Pt does not have a smart phone- unable to get an [facility]

## 2022-10-27 DIAGNOSIS — R072 Precordial pain: Secondary | ICD-10-CM | POA: Diagnosis not present

## 2022-10-28 ENCOUNTER — Other Ambulatory Visit: Payer: Self-pay | Admitting: *Deleted

## 2022-10-28 ENCOUNTER — Telehealth (HOSPITAL_COMMUNITY): Payer: Self-pay | Admitting: *Deleted

## 2022-10-28 DIAGNOSIS — R400 Somnolence: Secondary | ICD-10-CM

## 2022-10-28 DIAGNOSIS — R0683 Snoring: Secondary | ICD-10-CM

## 2022-10-28 LAB — BASIC METABOLIC PANEL
BUN/Creatinine Ratio: 11 (ref 9–20)
BUN: 10 mg/dL (ref 6–24)
CO2: 23 mmol/L (ref 20–29)
Calcium: 9.4 mg/dL (ref 8.7–10.2)
Chloride: 104 mmol/L (ref 96–106)
Creatinine, Ser: 0.9 mg/dL (ref 0.76–1.27)
Glucose: 73 mg/dL (ref 70–99)
Potassium: 4.5 mmol/L (ref 3.5–5.2)
Sodium: 143 mmol/L (ref 134–144)
eGFR: 107 mL/min/{1.73_m2} (ref >59)

## 2022-10-28 NOTE — Telephone Encounter (Signed)
Reaching out to patient to offer assistance regarding upcoming cardiac imaging study; pt verbalizes understanding of appt date/time, parking situation and where to check in, pre-test NPO status and medications ordered, and verified current allergies; name and call back number provided for further questions should they arise  Grant Mclinden RN Navigator Cardiac Imaging [facility] [phone] office [phone] cell  Patient to take 100mg metoprolol tartrate two hours prior to his cardiac CT scan. He is aware to arrive at 3pm. 

## 2022-10-29 ENCOUNTER — Ambulatory Visit (HOSPITAL_COMMUNITY): Admission: RE | Admit: 2022-10-29 | Payer: BC Managed Care – PPO | Source: Ambulatory Visit

## 2022-11-11 ENCOUNTER — Telehealth (HOSPITAL_COMMUNITY): Payer: Self-pay | Admitting: *Deleted

## 2022-11-11 NOTE — Telephone Encounter (Signed)
Reaching out to patient to offer assistance regarding upcoming cardiac imaging study; pt verbalizes understanding of appt date/time, parking situation and where to check in, pre-test NPO status and medications ordered, and verified current allergies; name and call back number provided for further questions should they arise  Lanique Gonzalo RN Navigator Cardiac Imaging [facility] [phone] office [phone] cell  Patient to take 100mg metoprolol tartrate two hours prior to his cardiac CT scan. He is aware to arrive at 3:30pm. 

## 2022-11-12 ENCOUNTER — Ambulatory Visit (HOSPITAL_COMMUNITY)
Admission: RE | Admit: 2022-11-12 | Discharge: 2022-11-12 | Disposition: A | Discharge status: Home / Self Care | Payer: BC Managed Care – PPO | Source: Ambulatory Visit | Attending: Cardiovascular Disease | Admitting: Cardiovascular Disease

## 2022-11-12 DIAGNOSIS — R072 Precordial pain: Secondary | ICD-10-CM | POA: Diagnosis not present

## 2022-11-12 MED ORDER — IOHEXOL 350 MG/ML SOLN
100.0000 mL | Freq: Once | INTRAVENOUS | Status: AC | PRN
  Administered 2022-11-12: 100 mL via INTRAVENOUS

## 2022-11-12 MED ORDER — METOPROLOL TARTRATE 5 MG/5ML IV SOLN
INTRAVENOUS | Status: AC
  Filled 2022-11-12: qty 20

## 2022-11-12 MED ORDER — METOPROLOL TARTRATE 5 MG/5ML IV SOLN
10.0000 mg | Freq: Once | INTRAVENOUS | Status: AC
  Administered 2022-11-12: 10 mg via INTRAVENOUS

## 2022-11-12 MED ORDER — NITROGLYCERIN 0.4 MG SL SUBL
0.8000 mg | SUBLINGUAL_TABLET | Freq: Once | SUBLINGUAL | Status: AC
  Administered 2022-11-12: 0.8 mg via SUBLINGUAL

## 2022-11-12 MED ORDER — NITROGLYCERIN 0.4 MG SL SUBL
SUBLINGUAL_TABLET | SUBLINGUAL | Status: AC
  Filled 2022-11-12: qty 2

## 2022-11-15 ENCOUNTER — Telehealth: Payer: Self-pay | Admitting: Emergency Medicine

## 2022-11-15 DIAGNOSIS — E7800 Pure hypercholesterolemia, unspecified: Secondary | ICD-10-CM

## 2022-11-15 NOTE — Telephone Encounter (Signed)
Called pt and he states that he saw and understood Dr [employer]. Instructed that we need a lipid panel done soon. He said he can come in on Friday; told him no food or drink after midnight- water is ok. He verbalized understanding.   Orders placed for lipid panel

## 2022-11-18 ENCOUNTER — Telehealth: Payer: Self-pay | Admitting: *Deleted

## 2022-11-18 NOTE — Telephone Encounter (Signed)
Message left for patient to call me to confirm insurance information in order for me to do PA for sleep study. Both policies say patient is non active.

## 2022-11-19 ENCOUNTER — Other Ambulatory Visit: Payer: Self-pay

## 2022-11-19 DIAGNOSIS — E7800 Pure hypercholesterolemia, unspecified: Secondary | ICD-10-CM

## 2022-11-20 LAB — LIPID PANEL
Chol/HDL Ratio: 5.8 ratio — ABNORMAL HIGH (ref 0.0–5.0)
Cholesterol, Total: 163 mg/dL (ref 100–199)
HDL: 28 mg/dL — ABNORMAL LOW (ref >39)
LDL Chol Calc (NIH): 119 mg/dL — ABNORMAL HIGH (ref 0–99)
Triglycerides: 83 mg/dL (ref 0–149)
VLDL Cholesterol Cal: 16 mg/dL (ref 5–40)

## 2022-11-21 ENCOUNTER — Other Ambulatory Visit: Payer: Self-pay | Admitting: Cardiovascular Disease

## 2022-11-21 MED ORDER — ROSUVASTATIN CALCIUM 20 MG PO TABS: 20.0000 mg | ORAL_TABLET | Freq: Every day | ORAL | 3 refills | Status: DC

## 2022-11-22 ENCOUNTER — Other Ambulatory Visit: Payer: Self-pay | Admitting: Emergency Medicine

## 2022-11-22 DIAGNOSIS — E7800 Pure hypercholesterolemia, unspecified: Secondary | ICD-10-CM

## 2022-11-22 NOTE — Addendum Note (Signed)
Addended by: Scheryl Marten on: 11/22/2022 08:52 AM   Modules accepted: Orders

## 2022-11-22 NOTE — Progress Notes (Signed)
Orders for lipid panel placed; return for lab work in 3 months

## 2022-11-25 ENCOUNTER — Encounter (HOSPITAL_COMMUNITY): Payer: Self-pay

## 2022-11-25 ENCOUNTER — Ambulatory Visit (HOSPITAL_COMMUNITY): Payer: BC Managed Care – PPO | Attending: Cardiovascular Disease

## 2022-11-26 ENCOUNTER — Encounter (HOSPITAL_COMMUNITY): Payer: Self-pay | Admitting: Cardiovascular Disease

## 2022-12-13 ENCOUNTER — Encounter: Payer: Self-pay | Admitting: Emergency Medicine

## 2022-12-13 ENCOUNTER — Emergency Department: Payer: BC Managed Care – PPO

## 2022-12-13 ENCOUNTER — Emergency Department
Admission: EM | Admit: 2022-12-13 | Discharge: 2022-12-13 | Disposition: A | Discharge status: Home / Self Care | Payer: BC Managed Care – PPO | Attending: Emergency Medicine | Admitting: Emergency Medicine

## 2022-12-13 ENCOUNTER — Other Ambulatory Visit: Payer: Self-pay

## 2022-12-13 DIAGNOSIS — R0789 Other chest pain: Secondary | ICD-10-CM | POA: Diagnosis not present

## 2022-12-13 DIAGNOSIS — R072 Precordial pain: Secondary | ICD-10-CM | POA: Insufficient documentation

## 2022-12-13 DIAGNOSIS — R079 Chest pain, unspecified: Secondary | ICD-10-CM

## 2022-12-13 LAB — BASIC METABOLIC PANEL
Anion gap: 5 (ref 5–15)
BUN: 15 mg/dL (ref 6–20)
CO2: 27 mmol/L (ref 22–32)
Calcium: 9.3 mg/dL (ref 8.9–10.3)
Chloride: 106 mmol/L (ref 98–111)
Creatinine, Ser: 0.9 mg/dL (ref 0.61–1.24)
GFR, Estimated: >60 mL/min (ref >60)
Glucose, Bld: 109 mg/dL — ABNORMAL HIGH (ref 70–99)
Potassium: 4.4 mmol/L (ref 3.5–5.1)
Sodium: 138 mmol/L (ref 135–145)

## 2022-12-13 LAB — CBC
HCT: 46.8 % (ref 39.0–52.0)
Hemoglobin: 15 g/dL (ref 13.0–17.0)
MCH: 27.3 pg (ref 26.0–34.0)
MCHC: 32.1 g/dL (ref 30.0–36.0)
MCV: 85.1 fL (ref 80.0–100.0)
Platelets: 204 10*3/uL (ref 150–400)
RBC: 5.5 MIL/uL (ref 4.22–5.81)
RDW: 12.5 % (ref 11.5–15.5)
WBC: 8.4 10*3/uL (ref 4.0–10.5)
nRBC: 0 % (ref 0.0–0.2)

## 2022-12-13 LAB — TROPONIN I (HIGH SENSITIVITY)
Troponin I (High Sensitivity): 4 ng/L (ref <18)
Troponin I (High Sensitivity): 4 ng/L (ref <18)

## 2022-12-13 MED ORDER — ASPIRIN 81 MG PO CHEW
324.0000 mg | CHEWABLE_TABLET | Freq: Once | ORAL | Status: AC
  Administered 2022-12-13: 324 mg via ORAL
  Filled 2022-12-13: qty 4

## 2022-12-13 MED ORDER — NITROGLYCERIN 0.4 MG SL SUBL: 0.4000 mg | SUBLINGUAL_TABLET | SUBLINGUAL | Status: DC | PRN

## 2022-12-13 NOTE — ED Triage Notes (Signed)
Pt ambulatory to triage with reports that he woke up this am with left sided chest pressure and palpitations. Pt reports vomit x 2 this am.

## 2022-12-13 NOTE — Discharge Instructions (Addendum)
You are seen in the emergency department for chest pain.  You had 2 heart enzymes that were normal, do not believe you are having a heart attack today.  It is importantly follow-up closely with cardiology to discuss further workup for cardiac reasons for your chest pain.  Return to the emergency department if you have return of your symptoms.

## 2022-12-13 NOTE — ED Notes (Signed)
See triage note  Presents with chest pain which started this am   States pain did not wake him up   Describes as pressure  and felt like hear was racing

## 2022-12-13 NOTE — ED Provider Notes (Addendum)
[facility] Provider Note    Event Date/Time   First MD Initiated Contact with Patient 12/13/22 [phone]     (approximate)   History   Chest Pain   HPI  Grant [name] is a 47 y.o. male past medical history significant for obesity, nephrolithiasis, who presents to the emergency department with chest pain.  Chest pain started at 630 this morning while he was at rest.  Substernal chest pressure associated with an episode of nausea, diaphoresis and breaking out into a hot sweat.  Currently rates his pain as 4/10.  Recent coronary artery perfusion study that showed no obvious acute findings.  Told that if he had recurrent episodes of chest pain he may need a cardiac catheterization.  No abdominal pain.  No fever or chills.  No cough or shortness of breath.  No history of DVT or PE.  No recent travel or surgery.  Family history coronary artery disease from his father.      Physical Exam   Triage Vital Signs: ED Triage Vitals [12/13/22 0737]  Enc Vitals Group     BP 129/76     Pulse Rate 86     Resp 17     Temp 98.2 F (36.8 C)     Temp Source Oral     SpO2 97 %     Weight (!) 345 lb (156.5 kg)     Height 6' (1.829 m)     Head Circumference      Peak Flow      Pain Score 6     Pain Loc      Pain Edu?      Excl. in GC?     Most recent vital signs: Vitals:   12/13/22 0737 12/13/22 0842  BP: 129/76   Pulse: 86   Resp: 17 19  Temp: 98.2 F (36.8 C)   SpO2: 97%     Physical Exam  IMPRESSION / MDM / ASSESSMENT AND PLAN / ED COURSE  I reviewed the triage vital signs and the nursing notes.  On chart review patient is followed by cardiologist Dr. Royann Shivers, recent coronary artery perfusion study that showed no acute findings.  Differential diagnosis including ACS, pneumonia, pleurisy, gastritis/PUD.  PERC negative, low suspicion for pulmonary embolism.  Low suspicion for pericarditis.  Clinical picture is not consistent with dissection, low suspicion  given no tearing chest pain, equal pulses, no uncontrolled hypertension or tobacco use  EKG  I, Corena Herter, the attending physician, personally viewed and interpreted this ECG.   Rate: Normal  Rhythm: Normal sinus  Axis: Right  Intervals: Normal  ST&T Change: None PAC present.  No significant change when compared to prior EKG.  No tachycardic or bradycardic dysrhythmias while on cardiac telemetry.  RADIOLOGY I independently reviewed imaging, my interpretation of imaging: No acute findings.  Read as no acute findings.  Possible hiatal hernia.  LABS (all labs ordered are listed, but only abnormal results are displayed) Labs interpreted as -    Labs Reviewed  BASIC METABOLIC PANEL - Abnormal; Notable for the following components:      Result Value   Glucose, Bld 109 (*)    All other components within normal limits  CBC  TROPONIN I (HIGH SENSITIVITY)  TROPONIN I (HIGH SENSITIVITY)     MDM  Patient was given aspirin and nitroglycerin.  On reevaluation patient currently chest pain-free.  Discussed with Dr. Kirke Corin who felt that the patient could have close follow-up as an  outpatient with cardiology for possible further stress testing or cardiac catheterization if his symptoms recurred or returned.  If remains chest pain-free with serial troponins that are negative recommended discharge home with close outpatient follow-up.  Serial troponins are negative.  Remains chest pain-free while in the emergency department.  Heart score of 3.  Patient will be discharged with outpatient follow-up with cardiology.  Given return precautions for return of his symptoms.     PROCEDURES:  Critical Care performed: No  Procedures  Patient's presentation is most consistent with acute presentation with potential threat to life or bodily function.   MEDICATIONS ORDERED IN ED: Medications  nitroGLYCERIN (NITROSTAT) SL tablet 0.4 mg (has no administration in time range)  aspirin chewable  tablet 324 mg (324 mg Oral Given 12/13/22 0815)    FINAL CLINICAL IMPRESSION(S) / ED DIAGNOSES   Final diagnoses:  Chest pain, unspecified type     Rx / DC Orders   ED Discharge Orders          Ordered    Ambulatory referral to Cardiology       Comments: If you have not heard from the Cardiology office within the next 72 hours please call [phone].   12/13/22 0907             Note:  This document was prepared using Dragon voice recognition software and may include unintentional dictation errors.   Corena Herter, MD 12/13/22 0981    Corena Herter, MD 12/13/22 1005

## 2022-12-21 ENCOUNTER — Telehealth: Payer: Self-pay | Admitting: Emergency Medicine

## 2022-12-21 NOTE — Telephone Encounter (Signed)
Called to schedule a follow up appt with an APP, no answer, left message and call back number.   (Following up in regards to this message)  Croitoru, Rachelle Hora, MD  P Cv Div Nl Scheduling; Scheryl Marten, RN Tried to reach him and his wife to discuss scheduling him for an OP cath. Did not get an answer. Ideally, would like a heart cath w Dr. Herbie [place] (his father's Cardiologist).       Previous Messages    ----- Message ----- From: Iran Ouch, MD Sent: 12/13/2022   9:05 AM EDT To: Thurmon Fair, MD; Cv Div Nl Scheduling  This patient needs a follow-up appointment with Dr. Royann Shivers or APP within 1 week. He came to [facility] ED with atypical chest pain with normal troponin and EKG.  He might require an outpatient cardiac cath.

## 2022-12-27 ENCOUNTER — Telehealth: Payer: Self-pay | Admitting: Interventional Cardiology

## 2022-12-27 ENCOUNTER — Encounter: Payer: Self-pay | Admitting: Cardiovascular Disease

## 2022-12-27 NOTE — Telephone Encounter (Signed)
Patient has been contacted 3x to schedule f/u appt with Dr. Royann Shivers or an APP but no success in scheduling. Will send letter via [facility].

## 2022-12-27 NOTE — Telephone Encounter (Signed)
-----   Message from Iran Ouch, MD sent at 12/13/2022  9:03 AM EDT ----- This patient needs a follow-up appointment with Dr. Royann Shivers or APP within 1 week. He came to [facility] ED with atypical chest pain with normal troponin and EKG.  He might require an outpatient cardiac cath.

## 2023-01-03 DIAGNOSIS — M9904 Segmental and somatic dysfunction of sacral region: Secondary | ICD-10-CM | POA: Diagnosis not present

## 2023-01-03 DIAGNOSIS — M9903 Segmental and somatic dysfunction of lumbar region: Secondary | ICD-10-CM | POA: Diagnosis not present

## 2023-01-03 DIAGNOSIS — M545 Low back pain, unspecified: Secondary | ICD-10-CM | POA: Diagnosis not present

## 2023-01-03 DIAGNOSIS — M9905 Segmental and somatic dysfunction of pelvic region: Secondary | ICD-10-CM | POA: Diagnosis not present

## 2023-01-05 DIAGNOSIS — M9905 Segmental and somatic dysfunction of pelvic region: Secondary | ICD-10-CM | POA: Diagnosis not present

## 2023-01-05 DIAGNOSIS — M9903 Segmental and somatic dysfunction of lumbar region: Secondary | ICD-10-CM | POA: Diagnosis not present

## 2023-01-05 DIAGNOSIS — M9904 Segmental and somatic dysfunction of sacral region: Secondary | ICD-10-CM | POA: Diagnosis not present

## 2023-01-05 DIAGNOSIS — M545 Low back pain, unspecified: Secondary | ICD-10-CM | POA: Diagnosis not present

## 2023-01-19 NOTE — Addendum Note (Signed)
Addended by: Ashley Mariner C on: 01/19/2023 10:00 AM   Modules accepted: Orders

## 2023-01-30 ENCOUNTER — Emergency Department: Payer: BC Managed Care – PPO

## 2023-01-30 ENCOUNTER — Other Ambulatory Visit: Payer: Self-pay

## 2023-01-30 ENCOUNTER — Emergency Department
Admission: EM | Admit: 2023-01-30 | Discharge: 2023-01-31 | Disposition: A | Discharge status: Home / Self Care | Payer: BC Managed Care – PPO | Attending: Emergency Medicine | Admitting: Emergency Medicine

## 2023-01-30 DIAGNOSIS — R079 Chest pain, unspecified: Secondary | ICD-10-CM | POA: Diagnosis not present

## 2023-01-30 DIAGNOSIS — G43809 Other migraine, not intractable, without status migrainosus: Secondary | ICD-10-CM

## 2023-01-30 DIAGNOSIS — K449 Diaphragmatic hernia without obstruction or gangrene: Secondary | ICD-10-CM | POA: Diagnosis not present

## 2023-01-30 DIAGNOSIS — R0789 Other chest pain: Secondary | ICD-10-CM | POA: Diagnosis not present

## 2023-01-30 DIAGNOSIS — Z1152 Encounter for screening for COVID-19: Secondary | ICD-10-CM | POA: Diagnosis not present

## 2023-01-30 DIAGNOSIS — R0602 Shortness of breath: Secondary | ICD-10-CM | POA: Diagnosis not present

## 2023-01-30 LAB — CBC
HCT: 45 % (ref 39.0–52.0)
Hemoglobin: 14.8 g/dL (ref 13.0–17.0)
MCH: 28.2 pg (ref 26.0–34.0)
MCHC: 32.9 g/dL (ref 30.0–36.0)
MCV: 85.7 fL (ref 80.0–100.0)
Platelets: 206 10*3/uL (ref 150–400)
RBC: 5.25 MIL/uL (ref 4.22–5.81)
RDW: 12.9 % (ref 11.5–15.5)
WBC: 8.5 10*3/uL (ref 4.0–10.5)
nRBC: 0 % (ref 0.0–0.2)

## 2023-01-30 NOTE — ED Triage Notes (Addendum)
Pt to ed from home via POV for CP and SOB x 90 mins ago. Pt is CAOx4, in no acute distress and ambulatory in triage. Pt been seen here for CP previous. He does have an upcoming apt follow up with cardiology.

## 2023-01-31 LAB — HEPATIC FUNCTION PANEL
ALT: 22 U/L (ref 0–44)
AST: 21 U/L (ref 15–41)
Albumin: 3.7 g/dL (ref 3.5–5.0)
Alkaline Phosphatase: 69 U/L (ref 38–126)
Bilirubin, Direct: <0.1 mg/dL (ref 0.0–0.2)
Total Bilirubin: 0.6 mg/dL (ref 0.3–1.2)
Total Protein: 6.5 g/dL (ref 6.5–8.1)

## 2023-01-31 LAB — BASIC METABOLIC PANEL WITH GFR
Anion gap: 7 (ref 5–15)
BUN: 10 mg/dL (ref 6–20)
CO2: 22 mmol/L (ref 22–32)
Calcium: 8.8 mg/dL — ABNORMAL LOW (ref 8.9–10.3)
Chloride: 107 mmol/L (ref 98–111)
Creatinine, Ser: 1.01 mg/dL (ref 0.61–1.24)
GFR, Estimated: >60 mL/min
Glucose, Bld: 170 mg/dL — ABNORMAL HIGH (ref 70–99)
Potassium: 3.9 mmol/L (ref 3.5–5.1)
Sodium: 136 mmol/L (ref 135–145)

## 2023-01-31 LAB — TROPONIN I (HIGH SENSITIVITY)
Troponin I (High Sensitivity): 4 ng/L (ref <18)
Troponin I (High Sensitivity): 5 ng/L

## 2023-01-31 LAB — LIPASE, BLOOD: Lipase: 45 U/L (ref 11–51)

## 2023-01-31 LAB — SARS CORONAVIRUS 2 BY RT PCR: SARS Coronavirus 2 by RT PCR: NEGATIVE

## 2023-01-31 MED ORDER — ACETAMINOPHEN 500 MG PO TABS
1000.0000 mg | ORAL_TABLET | Freq: Once | ORAL | Status: AC
  Administered 2023-01-31: 1000 mg via ORAL

## 2023-01-31 MED ORDER — ONDANSETRON 4 MG PO TBDP
4.0000 mg | ORAL_TABLET | Freq: Once | ORAL | Status: AC
  Administered 2023-01-31: 4 mg via ORAL
  Filled 2023-01-31: qty 1

## 2023-01-31 MED ORDER — KETOROLAC TROMETHAMINE 30 MG/ML IJ SOLN
30.0000 mg | Freq: Once | INTRAMUSCULAR | Status: AC
  Administered 2023-01-31: 30 mg via INTRAMUSCULAR
  Filled 2023-01-31: qty 1

## 2023-01-31 NOTE — ED Provider Notes (Signed)
[facility] Provider Note    Event Date/Time   First MD Initiated Contact with Patient 01/30/23 2355     (approximate)   History   Chest Pain (X 90 mins)   HPI  Grant [name] is a 47 y.o. male who presents to the ED for evaluation of Chest Pain (X 90 mins)   I reviewed ED visit from 5/20 for atypical chest pain and subsequent cardiology referral with telephone encounters where he was called 3 times to schedule, but no response.  Patient presents to the ED for evaluation of malaise, single episode of emesis, headache, chest discomfort in the past 24 hours.  Reports feeling "yucky" with malaise over the past 24 hours.  Went to bed early.  Reports 1 episode of emesis without abdominal pain and subsequent headache, consistent with previous migraines.  Then reports developing left-sided chest discomfort while laying in bed without dyspnea, cough, syncope.  No abdominal pain.  No stool changes or urinary changes.  No fevers.  No sick contacts  Physical Exam   Triage Vital Signs: ED Triage Vitals  Enc Vitals Group     BP 01/30/23 2335 120/80     Pulse Rate 01/30/23 2334 77     Resp 01/30/23 2334 16     Temp 01/30/23 2334 98.3 F (36.8 C)     Temp Source 01/30/23 2334 Oral     SpO2 01/30/23 2334 98 %     Weight 01/30/23 2330 (!) 345 lb (156.5 kg)     Height 01/30/23 2330 6' (1.829 m)     Head Circumference --      Peak Flow --      Pain Score 01/30/23 2329 7     Pain Loc --      Pain Edu? --      Excl. in GC? --     Most recent vital signs: Vitals:   01/31/23 0050 01/31/23 0200  BP: 117/62 114/68  Pulse: 79 80  Resp: 15 20  Temp:    SpO2: 96% 92%    General: Awake, no distress.  Morbidly obese, well-appearing and conversational CV:  Good peripheral perfusion.  RRR without appreciable murmur Resp:  Normal effort.  Abd:  No distention.  Soft and benign throughout, including epigastrium and RUQ MSK:  No deformity noted.  Reproducible chest  discomfort on left-sided parasternal anterior chest without overlying skin changes or signs of trauma Neuro:  No focal deficits appreciated. Other:     ED Results / Procedures / Treatments   Labs (all labs ordered are listed, but only abnormal results are displayed) Labs Reviewed  BASIC METABOLIC PANEL - Abnormal; Notable for the following components:      Result Value   Glucose, Bld 170 (*)    Calcium 8.8 (*)    All other components within normal limits  SARS CORONAVIRUS 2 BY RT PCR  CBC  HEPATIC FUNCTION PANEL  LIPASE, BLOOD  TROPONIN I (HIGH SENSITIVITY)  TROPONIN I (HIGH SENSITIVITY)    EKG Sinus rhythm with sinus arrhythmia, rate of 82 bpm.  Normal axis and intervals.  No clear signs of acute ischemia.  Similar morphology as in may  RADIOLOGY CXR interpreted by me without evidence of acute cardiopulmonary pathology.  Official radiology report(s): DG Chest 2 View  Result Date: 01/30/2023 CLINICAL DATA:  Chest pain and shortness of breath. EXAM: CHEST - 2 VIEW COMPARISON:  12/13/2022, cardiac CT 11/12/2018 FINDINGS: The cardiomediastinal contours are normal. Hiatal hernia. Pulmonary  vasculature is normal. No consolidation, pleural effusion, or pneumothorax. No acute osseous abnormalities are seen. IMPRESSION: 1. No acute chest findings. 2. Hiatal hernia. Electronically Signed   By: Narda Rutherford M.D.   On: 01/30/2023 23:55    PROCEDURES and INTERVENTIONS:  .1-3 Lead EKG Interpretation  Performed by: Delton Prairie, MD Authorized by: Delton Prairie, MD     Interpretation: normal     ECG rate:  74   ECG rate assessment: normal     Rhythm: sinus rhythm     Ectopy: none     Conduction: normal     Medications  ketorolac (TORADOL) 30 MG/ML injection 30 mg (30 mg Intramuscular Given 01/31/23 0050)  ondansetron (ZOFRAN-ODT) disintegrating tablet 4 mg (4 mg Oral Given 01/31/23 0050)  acetaminophen (TYLENOL) tablet 1,000 mg (1,000 mg Oral Given 01/31/23 0049)     IMPRESSION /  MDM / ASSESSMENT AND PLAN / ED COURSE  I reviewed the triage vital signs and the nursing notes.  Differential diagnosis includes, but is not limited to, ACS, PTX, PNA, muscle strain/spasm, PE, dissection, anxiety, pleural effusion, viral syndrome, migraine  {Patient presents with symptoms of an acute illness or injury that is potentially life-threatening.  Patient presents with atypical chest discomfort superimposed on a typical migraine and malaise.  Reproducible chest pain but overall looks well.  No neurologic deficits or signs of trauma.  Benign abdomen.  Reassuring blood work with normal CBC, LFTs and lipase, first troponin.  EKG is nonischemic and CXR is clear.  We will keep him on the monitor, obtain a second troponin and provide medications for his typical migraine.  Awaiting COVID swab.  COVID swab returns negative as well as a second troponin.  Symptoms resolved after Toradol, Fioricet and Zofran.  Discussed the importance of following up with cardiology as well as return precautions for the ED.  Patient is suitable for outpatient management.  Clinical Course as of 01/31/23 0625  Mon Jan 31, 2023  0220 Reassessed.  Feels well now and asking to leave.  We discussed reassuring workup and following up with cardiology and return precautions [DS]    Clinical Course User Index [DS] Delton Prairie, MD     FINAL CLINICAL IMPRESSION(S) / ED DIAGNOSES   Final diagnoses:  Other chest pain  Other migraine without status migrainosus, not intractable     Rx / DC Orders   ED Discharge Orders     None        Note:  This document was prepared using Dragon voice recognition software and may include unintentional dictation errors.   Delton Prairie, MD 01/31/23 [phone]

## 2023-02-10 ENCOUNTER — Observation Stay
Admit: 2023-02-10 | Discharge: 2023-02-10 | Disposition: A | Discharge status: Home / Self Care | Payer: BC Managed Care – PPO | Attending: Cardiology | Admitting: Cardiology

## 2023-02-10 ENCOUNTER — Emergency Department: Payer: BC Managed Care – PPO

## 2023-02-10 ENCOUNTER — Encounter: Payer: Self-pay | Admitting: Family Medicine

## 2023-02-10 ENCOUNTER — Other Ambulatory Visit: Payer: Self-pay

## 2023-02-10 ENCOUNTER — Observation Stay
Admission: EM | Admit: 2023-02-10 | Discharge: 2023-02-11 | Disposition: A | Discharge status: Home / Self Care | Payer: BC Managed Care – PPO | Attending: Internal Medicine | Admitting: Internal Medicine

## 2023-02-10 ENCOUNTER — Encounter
Admission: EM | Disposition: A | Discharge status: Home / Self Care | Payer: Self-pay | Source: Home / Self Care | Attending: Family Medicine

## 2023-02-10 DIAGNOSIS — R0789 Other chest pain: Secondary | ICD-10-CM | POA: Diagnosis not present

## 2023-02-10 DIAGNOSIS — Z79899 Other long term (current) drug therapy: Secondary | ICD-10-CM | POA: Diagnosis not present

## 2023-02-10 DIAGNOSIS — R079 Chest pain, unspecified: Secondary | ICD-10-CM | POA: Diagnosis present

## 2023-02-10 DIAGNOSIS — R110 Nausea: Secondary | ICD-10-CM | POA: Diagnosis not present

## 2023-02-10 DIAGNOSIS — R931 Abnormal findings on diagnostic imaging of heart and coronary circulation: Secondary | ICD-10-CM

## 2023-02-10 DIAGNOSIS — E785 Hyperlipidemia, unspecified: Secondary | ICD-10-CM | POA: Insufficient documentation

## 2023-02-10 DIAGNOSIS — R1111 Vomiting without nausea: Secondary | ICD-10-CM | POA: Diagnosis not present

## 2023-02-10 DIAGNOSIS — R29818 Other symptoms and signs involving the nervous system: Secondary | ICD-10-CM | POA: Diagnosis not present

## 2023-02-10 DIAGNOSIS — I25110 Atherosclerotic heart disease of native coronary artery with unstable angina pectoris: Secondary | ICD-10-CM | POA: Diagnosis not present

## 2023-02-10 DIAGNOSIS — R112 Nausea with vomiting, unspecified: Secondary | ICD-10-CM

## 2023-02-10 DIAGNOSIS — I200 Unstable angina: Secondary | ICD-10-CM

## 2023-02-10 DIAGNOSIS — G4733 Obstructive sleep apnea (adult) (pediatric): Secondary | ICD-10-CM

## 2023-02-10 DIAGNOSIS — R42 Dizziness and giddiness: Secondary | ICD-10-CM | POA: Diagnosis not present

## 2023-02-10 DIAGNOSIS — Z951 Presence of aortocoronary bypass graft: Secondary | ICD-10-CM | POA: Insufficient documentation

## 2023-02-10 DIAGNOSIS — R0989 Other specified symptoms and signs involving the circulatory and respiratory systems: Secondary | ICD-10-CM | POA: Diagnosis not present

## 2023-02-10 HISTORY — PX: CORONARY STENT INTERVENTION: CATH118234

## 2023-02-10 HISTORY — PX: LEFT HEART CATH AND CORONARY ANGIOGRAPHY: CATH118249

## 2023-02-10 LAB — CBC WITH DIFFERENTIAL/PLATELET
Abs Immature Granulocytes: 0.03 10*3/uL (ref 0.00–0.07)
Basophils Absolute: 0 10*3/uL (ref 0.0–0.1)
Basophils Relative: 0 %
Eosinophils Absolute: 0.1 10*3/uL (ref 0.0–0.5)
Eosinophils Relative: 2 %
HCT: 44 % (ref 39.0–52.0)
Hemoglobin: 14.4 g/dL (ref 13.0–17.0)
Immature Granulocytes: 1 %
Lymphocytes Relative: 25 %
Lymphs Abs: 1.6 10*3/uL (ref 0.7–4.0)
MCH: 27.4 pg (ref 26.0–34.0)
MCHC: 32.7 g/dL (ref 30.0–36.0)
MCV: 83.8 fL (ref 80.0–100.0)
Monocytes Absolute: 0.6 10*3/uL (ref 0.1–1.0)
Monocytes Relative: 10 %
Neutro Abs: 4 10*3/uL (ref 1.7–7.7)
Neutrophils Relative %: 62 %
Platelets: 201 10*3/uL (ref 150–400)
RBC: 5.25 MIL/uL (ref 4.22–5.81)
RDW: 12.7 % (ref 11.5–15.5)
WBC: 6.4 10*3/uL (ref 4.0–10.5)
nRBC: 0 % (ref 0.0–0.2)

## 2023-02-10 LAB — COMPREHENSIVE METABOLIC PANEL
ALT: 22 U/L (ref 0–44)
AST: 20 U/L (ref 15–41)
Albumin: 3.6 g/dL (ref 3.5–5.0)
Alkaline Phosphatase: 70 U/L (ref 38–126)
Anion gap: 7 (ref 5–15)
BUN: 16 mg/dL (ref 6–20)
CO2: 25 mmol/L (ref 22–32)
Calcium: 8.7 mg/dL — ABNORMAL LOW (ref 8.9–10.3)
Chloride: 104 mmol/L (ref 98–111)
Creatinine, Ser: 1.14 mg/dL (ref 0.61–1.24)
GFR, Estimated: >60 mL/min (ref >60)
Glucose, Bld: 139 mg/dL — ABNORMAL HIGH (ref 70–99)
Potassium: 4 mmol/L (ref 3.5–5.1)
Sodium: 136 mmol/L (ref 135–145)
Total Bilirubin: 0.7 mg/dL (ref 0.3–1.2)
Total Protein: 6.5 g/dL (ref 6.5–8.1)

## 2023-02-10 LAB — APTT: aPTT: 26 seconds (ref 24–36)

## 2023-02-10 LAB — TROPONIN I (HIGH SENSITIVITY)
Troponin I (High Sensitivity): 3 ng/L (ref <18)
Troponin I (High Sensitivity): 3 ng/L (ref <18)

## 2023-02-10 LAB — PROTIME-INR
INR: 1 (ref 0.8–1.2)
Prothrombin Time: 13.2 seconds (ref 11.4–15.2)

## 2023-02-10 LAB — BRAIN NATRIURETIC PEPTIDE: B Natriuretic Peptide: 5.2 pg/mL (ref 0.0–100.0)

## 2023-02-10 LAB — HIV ANTIBODY (ROUTINE TESTING W REFLEX): HIV Screen 4th Generation wRfx: NONREACTIVE

## 2023-02-10 SURGERY — LEFT HEART CATH AND CORONARY ANGIOGRAPHY
Anesthesia: Moderate Sedation

## 2023-02-10 MED ORDER — LIDOCAINE HCL (PF) 1 % IJ SOLN
INTRAMUSCULAR | Status: DC | PRN
  Administered 2023-02-10: 5 mL

## 2023-02-10 MED ORDER — HEPARIN (PORCINE) IN NACL 1000-0.9 UT/500ML-% IV SOLN
INTRAVENOUS | Status: AC
  Filled 2023-02-10: qty 1000

## 2023-02-10 MED ORDER — LIDOCAINE HCL 1 % IJ SOLN
INTRAMUSCULAR | Status: AC
  Filled 2023-02-10: qty 20

## 2023-02-10 MED ORDER — HEPARIN (PORCINE) 25000 UT/250ML-% IV SOLN
1500.0000 [IU]/h | INTRAVENOUS | Status: DC
  Administered 2023-02-10: 1500 [IU]/h via INTRAVENOUS
  Filled 2023-02-10: qty 250

## 2023-02-10 MED ORDER — SODIUM CHLORIDE 0.9 % IV SOLN: INTRAVENOUS | Status: AC

## 2023-02-10 MED ORDER — VERAPAMIL HCL 2.5 MG/ML IV SOLN
INTRAVENOUS | Status: AC
  Filled 2023-02-10: qty 2

## 2023-02-10 MED ORDER — SODIUM CHLORIDE 0.9% FLUSH: 3.0000 mL | INTRAVENOUS | Status: DC | PRN

## 2023-02-10 MED ORDER — ASPIRIN 81 MG PO TBEC
81.0000 mg | DELAYED_RELEASE_TABLET | Freq: Every day | ORAL | Status: DC
  Administered 2023-02-11: 81 mg via ORAL
  Filled 2023-02-10: qty 1

## 2023-02-10 MED ORDER — TICAGRELOR 90 MG PO TABS
90.0000 mg | ORAL_TABLET | Freq: Two times a day (BID) | ORAL | Status: DC
  Administered 2023-02-10 – 2023-02-11 (×2): 90 mg via ORAL
  Filled 2023-02-10 (×2): qty 1

## 2023-02-10 MED ORDER — SODIUM CHLORIDE 0.9% FLUSH
3.0000 mL | Freq: Two times a day (BID) | INTRAVENOUS | Status: DC
  Administered 2023-02-10 – 2023-02-11 (×2): 3 mL via INTRAVENOUS

## 2023-02-10 MED ORDER — HEPARIN (PORCINE) IN NACL 1000-0.9 UT/500ML-% IV SOLN
INTRAVENOUS | Status: AC
  Filled 2023-02-10: qty 500

## 2023-02-10 MED ORDER — HYDRALAZINE HCL 20 MG/ML IJ SOLN: 10.0000 mg | INTRAMUSCULAR | Status: AC | PRN

## 2023-02-10 MED ORDER — HEPARIN (PORCINE) IN NACL 1000-0.9 UT/500ML-% IV SOLN
INTRAVENOUS | Status: DC | PRN
  Administered 2023-02-10 (×3): 500 mL

## 2023-02-10 MED ORDER — ENOXAPARIN SODIUM 40 MG/0.4ML IJ SOSY: 40.0000 mg | PREFILLED_SYRINGE | INTRAMUSCULAR | Status: DC

## 2023-02-10 MED ORDER — LABETALOL HCL 5 MG/ML IV SOLN: 10.0000 mg | INTRAVENOUS | Status: AC | PRN

## 2023-02-10 MED ORDER — HEPARIN BOLUS VIA INFUSION
4000.0000 [IU] | Freq: Once | INTRAVENOUS | Status: AC
  Administered 2023-02-10: 4000 [IU] via INTRAVENOUS
  Filled 2023-02-10: qty 4000

## 2023-02-10 MED ORDER — SODIUM CHLORIDE 0.9 % WEIGHT BASED INFUSION: 1.0000 mL/kg/h | INTRAVENOUS | Status: DC

## 2023-02-10 MED ORDER — HEPARIN SODIUM (PORCINE) 1000 UNIT/ML IJ SOLN
INTRAMUSCULAR | Status: AC
  Filled 2023-02-10: qty 10

## 2023-02-10 MED ORDER — ACETAMINOPHEN 325 MG PO TABS: 650.0000 mg | ORAL_TABLET | ORAL | Status: DC | PRN

## 2023-02-10 MED ORDER — VERAPAMIL HCL 2.5 MG/ML IV SOLN
INTRAVENOUS | Status: DC | PRN
  Administered 2023-02-10: 2.5 mg via INTRA_ARTERIAL

## 2023-02-10 MED ORDER — MIDAZOLAM HCL 2 MG/2ML IJ SOLN
INTRAMUSCULAR | Status: AC
  Filled 2023-02-10: qty 2

## 2023-02-10 MED ORDER — IOHEXOL 300 MG/ML  SOLN
INTRAMUSCULAR | Status: DC | PRN
  Administered 2023-02-10: 260 mL

## 2023-02-10 MED ORDER — ONDANSETRON HCL 4 MG/2ML IJ SOLN: 4.0000 mg | Freq: Four times a day (QID) | INTRAMUSCULAR | Status: DC | PRN

## 2023-02-10 MED ORDER — METOPROLOL SUCCINATE ER 25 MG PO TB24
25.0000 mg | ORAL_TABLET | Freq: Every day | ORAL | Status: DC
  Administered 2023-02-10 – 2023-02-11 (×2): 25 mg via ORAL
  Filled 2023-02-10 (×2): qty 1

## 2023-02-10 MED ORDER — TICAGRELOR 90 MG PO TABS
ORAL_TABLET | ORAL | Status: DC | PRN
  Administered 2023-02-10: 180 mg via ORAL

## 2023-02-10 MED ORDER — MORPHINE SULFATE (PF) 2 MG/ML IV SOLN: 2.0000 mg | INTRAVENOUS | Status: DC | PRN

## 2023-02-10 MED ORDER — SODIUM CHLORIDE 0.9 % IV SOLN: 250.0000 mL | INTRAVENOUS | Status: DC | PRN

## 2023-02-10 MED ORDER — ROSUVASTATIN CALCIUM 10 MG PO TABS
40.0000 mg | ORAL_TABLET | Freq: Every day | ORAL | Status: DC
  Administered 2023-02-11: 40 mg via ORAL
  Filled 2023-02-10: qty 2
  Filled 2023-02-10: qty 4

## 2023-02-10 MED ORDER — MIDAZOLAM HCL 2 MG/2ML IJ SOLN
INTRAMUSCULAR | Status: DC | PRN
  Administered 2023-02-10 (×4): 1 mg via INTRAVENOUS

## 2023-02-10 MED ORDER — SODIUM CHLORIDE 0.9 % WEIGHT BASED INFUSION: 3.0000 mL/kg/h | INTRAVENOUS | Status: DC

## 2023-02-10 MED ORDER — FENTANYL CITRATE (PF) 100 MCG/2ML IJ SOLN
INTRAMUSCULAR | Status: AC
  Filled 2023-02-10: qty 2

## 2023-02-10 MED ORDER — TICAGRELOR 90 MG PO TABS
ORAL_TABLET | ORAL | Status: AC
  Filled 2023-02-10: qty 2

## 2023-02-10 MED ORDER — FENTANYL CITRATE (PF) 100 MCG/2ML IJ SOLN
INTRAMUSCULAR | Status: DC | PRN
  Administered 2023-02-10 (×4): 25 ug via INTRAVENOUS

## 2023-02-10 MED ORDER — HEPARIN SODIUM (PORCINE) 1000 UNIT/ML IJ SOLN
INTRAMUSCULAR | Status: DC | PRN
  Administered 2023-02-10: 8000 [IU] via INTRAVENOUS
  Administered 2023-02-10: 4000 [IU] via INTRAVENOUS
  Administered 2023-02-10: 8000 [IU] via INTRAVENOUS
  Administered 2023-02-10: 4000 [IU] via INTRAVENOUS

## 2023-02-10 MED ORDER — ASPIRIN 81 MG PO CHEW: 81.0000 mg | CHEWABLE_TABLET | ORAL | Status: DC

## 2023-02-10 SURGICAL SUPPLY — 28 items
BALLN EUPHORA RX 2.5X12 (BALLOONS) ×1
BALLN EUPHORA RX 3.0X15 (BALLOONS) ×1
BALLN SCOREFLEX 3.50X15 (BALLOONS) ×1
BALLN ~~LOC~~ TREK NEO RX 4.0X15 (BALLOONS) ×1
BALLOON EUPHORA RX 2.5X12 (BALLOONS) IMPLANT
BALLOON EUPHORA RX 3.0X15 (BALLOONS) IMPLANT
BALLOON SCOREFLEX 3.50X15 (BALLOONS) IMPLANT
BALLOON ~~LOC~~ TREK NEO RX 4.0X15 (BALLOONS) IMPLANT
CATH 5F 110X4 TIG (CATHETERS) IMPLANT
CATH AMP RT 5F (CATHETERS) IMPLANT
CATH GUIDE ADROIT 6FR AL.75 (CATHETERS) IMPLANT
CATH INFINITI 5 FR 3DRC (CATHETERS) IMPLANT
CATH INFINITI 5FR ANG PIGTAIL (CATHETERS) IMPLANT
DEVICE RAD TR BAND REGULAR (VASCULAR PRODUCTS) IMPLANT
DRAPE BRACHIAL (DRAPES) IMPLANT
DRAPE FEMORAL ANGIO W/ POUCH (DRAPES) IMPLANT
GLIDESHEATH SLEND SS 6F .021 (SHEATH) IMPLANT
GUIDEWIRE INQWIRE 1.5J.035X260 (WIRE) IMPLANT
INQWIRE 1.5J .035X260CM (WIRE) ×1
KIT ENCORE 26 ADVANTAGE (KITS) IMPLANT
KIT SYRINGE INJ CVI SPIKEX1 (MISCELLANEOUS) IMPLANT
PACK CARDIAC CATH (CUSTOM PROCEDURE TRAY) ×1 IMPLANT
PROTECTION STATION PRESSURIZED (MISCELLANEOUS) ×1
SET ATX-X65L (MISCELLANEOUS) IMPLANT
STATION PROTECTION PRESSURIZED (MISCELLANEOUS) IMPLANT
STENT ONYX FRONTIER 3.5X22 (Permanent Stent) IMPLANT
TUBING CIL FLEX 10 FLL-RA (TUBING) IMPLANT
WIRE ASAHI PROWATER 180CM (WIRE) IMPLANT

## 2023-02-10 NOTE — ED Notes (Signed)
RN contacted lab about adding APTT and Protime INR to previous collection.

## 2023-02-10 NOTE — ED Notes (Signed)
Patient given warm blanket.

## 2023-02-10 NOTE — Assessment & Plan Note (Addendum)
Patient with severe chest pain and chest pressure today with baseline recurrent chest pain over multiple months and noted coronary calcium score in the 99 percentile, coronary calcium score of 1078, mid/distal RCA difficult to evaluate, mild stenosis in prox/mid LAD, prox LCX and prox RCA.Obstructive CAD could not be excluded on coronary CT April 2024 Troponin negative x 1 EKG normal sinus rhythm Heart score 6  Dr. Okey Dupre with cardiology at the bedside with plan for cardiac catheterization today Heparin drip started in the ER 2D echo Risk stratification labs Follow-up formal cardiology recommendations

## 2023-02-10 NOTE — H&P (View-Only) (Signed)
Cardiology Consultation   Patient ID: TAJON MORING MRN: [id number]; DOB: Jun 18, 1976  Admit date: 02/10/2023 Date of Consult: 02/10/2023  PCP:  Patient, No Pcp Per   [facility] Providers Cardiologist:  Thurmon Fair, MD   {   Patient Profile:   Grant [name] is a 47 y.o. male with a hx of HLD, GERD, CAD by Cardiac CTA 10/2022, OSA, who is being seen 02/10/2023 for the evaluation of chest pain at the request of Dr. Alvester Morin.  History of Present Illness:   [name] was seen by Dr. Angelena Sole in March 2024 with chest pain and dyspnea. A cardiac CTA and an echocardiogram were ordered. Cardiac CTA 10/2022 showed 1078, 99th percentile for age and sex matched, mild stenosis in the LAD and pLCX and proximal RCA. RCA difficult to evaluate, appeared to have moderate stenosis in mid/distal RCA but cannot exclude more significant obstruction. Unable to send for FFR due to artifact. Obstructive CAD could not be excluded. He did not get the echo performed.   Family history of CAD, father had bypass x7 in his late 86s. He dips. NO drug or alcohol history. He works at a [employer].   The patient presented to [facility] ED 02/10/23 with chest pain and SOB. This morning when he was waking up he started experiencing chest pain that wen down the left arm. It slowly got worse and he felt left arm go numb. Pain got up 7/10. He felt nauseous and vomited. EMS was called who gave him 4 baby aspirin and SLNTG with improvement.   In the ER HS trop 4. WBC 6.4, Hgb 14.4. BNP 5.2. EKG showed NSR TWI aVL. The patient reported persistent  4/10 chest pain. He was started on IV heparin and admitted with plan for heart cath.   Past Medical History:  Diagnosis Date   Kidney stones     Past Surgical History:  Procedure Laterality Date   APPENDECTOMY     TONSILLECTOMY       Home Medications:  Prior to Admission medications   Medication Sig Start Date End Date Taking? Authorizing Provider  omeprazole  (PRILOSEC) 40 MG capsule Take 1 capsule (40 mg total) by mouth daily. 10/22/22  Yes Croitoru, Mihai, MD  rosuvastatin (CRESTOR) 20 MG tablet Take 1 tablet (20 mg total) by mouth daily. 11/21/22 02/19/23 Yes Croitoru, Mihai, MD  doxycycline (VIBRAMYCIN) 100 MG capsule Take 1 capsule (100 mg total) by mouth 2 (two) times daily. Patient not taking: Reported on 10/22/2022 08/24/22   Arthor Captain, PA-C  metoprolol tartrate (LOPRESSOR) 100 MG tablet Take 1 tablet (100 mg total) by mouth once for 1 dose. PLEASE TAKE METOPROLOL 2  HOURS PRIOR TO CTA SCAN. 10/22/22 10/22/22  Croitoru, Mihai, MD  ondansetron (ZOFRAN-ODT) 4 MG disintegrating tablet Dissolve 1 tablet under the tongue every 8 (eight) hours as needed for nausea or vomiting. Patient not taking: Reported on 10/22/2022 04/29/22   Henderly, Britni A, PA-C  oxyCODONE-acetaminophen (PERCOCET/ROXICET) 5-325 MG tablet Take 1-2 tablets by mouth every 6 (six) hours as needed for severe pain. Patient not taking: Reported on 10/22/2022 05/06/22   Molpus, John, MD  tamsulosin (FLOMAX) 0.4 MG CAPS capsule Take 1 capsule (0.4 mg total) by mouth daily. Patient not taking: Reported on 10/22/2022 04/29/22   Henderly, Britni A, PA-C    Inpatient Medications: Scheduled Meds:  heparin  4,000 Units Intravenous Once   rosuvastatin  40 mg Oral Daily   Continuous Infusions:  heparin  PRN Meds: acetaminophen, ondansetron (ZOFRAN) IV  Allergies:   No Known Allergies  Social History:   Social History   Socioeconomic History   Marital status: Married    Spouse name: Not on file   Number of children: Not on file   Years of education: Not on file   Highest education level: Not on file  Occupational History   Not on file  Tobacco Use   Smoking status: Never   Smokeless tobacco: Never  Substance and Sexual Activity   Alcohol use: Yes    Comment: Seldom   Drug use: Not Currently   Sexual activity: Not Currently  Other Topics Concern   Not on file  Social  History Narrative   Not on file   Social Determinants of Health   Financial Resource Strain: Not on file  Food Insecurity: Not on file  Transportation Needs: Not on file  Physical Activity: Not on file  Stress: Not on file  Social Connections: Unknown (12/05/2021)   Received from [facility]   Social Network    Social Network: Not on file  Intimate Partner Violence: Unknown (10/29/2021)   Received from [facility]   HITS    Physically Hurt: Not on file    Insult or Talk Down To: Not on file    Threaten Physical Harm: Not on file    Scream or Curse: Not on file    Family History:   Family History  Problem Relation Age of Onset   CAD Father      ROS:  Please see the history of present illness.  All other ROS reviewed and negative.     Physical Exam/Data:   Vitals:   02/10/23 0729 02/10/23 0730  BP: 127/78   Pulse: 88   Resp: 20   Temp: 98.8 F (37.1 C)   TempSrc: Oral   SpO2: 95%   Weight:  (!) 154.2 kg  Height:  6\' 1"  (1.854 m)   No intake or output data in the 24 hours ending 02/10/23 0957    02/10/2023    7:30 AM 01/30/2023   11:30 PM 12/13/2022    7:37 AM  Last 3 Weights  Weight (lbs) 340 lb 345 lb 345 lb  Weight (kg) 154.223 kg 156.491 kg 156.491 kg     Body mass index is 44.86 kg/m.  General:  Well nourished, well developed, in no acute distress HEENT: normal Neck: no JVD Vascular: No carotid bruits; Distal pulses 2+ bilaterally Cardiac:  normal S1, S2; RRR; no murmur  Lungs:  clear to auscultation bilaterally, no wheezing, rhonchi or rales  Abd: soft, nontender, no hepatomegaly  Ext: no edema Musculoskeletal:  No deformities, BUE and BLE strength normal and equal Skin: warm and dry  Neuro:  CNs 2-12 intact, no focal abnormalities noted Psych:  Normal affect   EKG:  The EKG was personally reviewed and demonstrates:  NSR 92bpm, TWI aVL Telemetry:  Telemetry was personally reviewed and demonstrates:  NSR 80s  Relevant CV Studies:  Cardia CTA  10/2022 IMPRESSION: 1. Coronary calcium score of 1078. This was 99th percentile for age and sex matched control.   2.  Normal coronary origin with right dominance.   3. Poor quality study. There is significant noise likely secondary to obesity, step artifacts from cardiac motion, and severe coronary calcifications. Recommend alternative imaging modality   4. RCA is difficult to evaluate, appears moderate stenosis in mid/distal RCA but cannot exclude more significant obstruction. Unable to send for CTFFR, was  rejected due to artifact. Recommend alternative evaluation as above   5. Mild (25-49%) stenosis in proximal/mid LAD, proximal LCX, and proximal RCA   6.  Dilated main pulmonary artery measuring 31mm   CAD-RADS N Non-diagnostic study. Obstructive CAD can't be excluded. Alternative evaluation is recommended.   Electronically Signed: By: Epifanio Lesches M.D. On: 11/14/2022 15:54    Laboratory Data:  High Sensitivity Troponin:   Recent Labs  Lab 01/30/23 2331 01/31/23 0141 02/10/23 0728  TROPONINIHS 5 4 3      Chemistry Recent Labs  Lab 02/10/23 0728  NA 136  K 4.0  CL 104  CO2 25  GLUCOSE 139*  BUN 16  CREATININE 1.14  CALCIUM 8.7*  GFRNONAA >60  ANIONGAP 7    Recent Labs  Lab 02/10/23 0728  PROT 6.5  ALBUMIN 3.6  AST 20  ALT 22  ALKPHOS 70  BILITOT 0.7   Lipids No results for input(s): "CHOL", "TRIG", "HDL", "LABVLDL", "LDLCALC", "CHOLHDL" in the last 168 hours.  Hematology Recent Labs  Lab 02/10/23 0728  WBC 6.4  RBC 5.25  HGB 14.4  HCT 44.0  MCV 83.8  MCH 27.4  MCHC 32.7  RDW 12.7  PLT 201   Thyroid No results for input(s): "TSH", "FREET4" in the last 168 hours.  BNP Recent Labs  Lab 02/10/23 0728  BNP 5.2    DDimer No results for input(s): "DDIMER" in the last 168 hours.   Radiology/Studies:  CT Head Wo Contrast  Result Date: 02/10/2023 CLINICAL DATA:  Neuro deficit, acute, stroke suspected. EXAM: CT HEAD WITHOUT  CONTRAST TECHNIQUE: Contiguous axial images were obtained from the base of the skull through the vertex without intravenous contrast. RADIATION DOSE REDUCTION: This exam was performed according to the departmental dose-optimization program which includes automated exposure control, adjustment of the mA and/or kV according to patient size and/or use of iterative reconstruction technique. COMPARISON:  Head CT 02/21/2006.  MRI brain 03/04/2006. FINDINGS: Brain: No acute intracranial hemorrhage. Gray-white differentiation is preserved. No hydrocephalus or extra-axial collection. No mass effect or midline shift. Vascular: No hyperdense vessel or unexpected calcification. Skull: No calvarial fracture or suspicious bone lesion. Skull base is unremarkable. Sinuses/Orbits: Unremarkable. Other: None. IMPRESSION: No acute intracranial abnormality. Electronically Signed   By: Orvan Falconer M.D.   On: 02/10/2023 08:10   DG Chest Port 1 View  Result Date: 02/10/2023 CLINICAL DATA:  Chest pain. EXAM: PORTABLE CHEST 1 VIEW COMPARISON:  01/30/2023. FINDINGS: Low lung volumes accentuate the pulmonary vasculature and cardiomediastinal silhouette. No consolidation or pulmonary edema. No pleural effusion or pneumothorax. IMPRESSION: Low lung volumes without evidence of acute cardiopulmonary disease. Electronically Signed   By: Orvan Falconer M.D.   On: 02/10/2023 08:07     Assessment and Plan:   Unstable Angina CAD - presented with chest pain radiating to left arm. Initial Hs trop 4. EKG non-ishcemic - Cardiac CTA in 10/2022 with CAD, coronary calcium score of 1078, mid/distal RCA difficult to evaluate, mild stenosis in prox/mid LAD, prox LCX and prox RCA.Obstructive CAD could not be excluded, could not send for FFR -  patient still with active chest pain 4/10 - start IV heparin - continue to trend troponin - check echo - start ASA, continue statin and start a BB - Plan for LHC today Risks and benefits of cardiac  catheterization have been discussed with the patient.  These include bleeding, infection, kidney damage, stroke, heart attack, death.  The patient understands these risks and is willing to proceed.  HLD - LDL 119, HDL 28, TG 83, total chol 161 - Crestor increased to 40mg  daily - re-check lipid panel  OSA - needs CPAP   For questions or updates, please contact [facility] Please consult [email or web address] for contact info under    Signed, Jestine Bicknell David Stall, PA-C  02/10/2023 9:57 AM

## 2023-02-10 NOTE — Progress Notes (Signed)
Pt finished meal tray and watching tv, family at bedside

## 2023-02-10 NOTE — ED Provider Notes (Signed)
[facility] Provider Note   Event Date/Time   First MD Initiated Contact with Patient 02/10/23 [phone]     (approximate) History  Chest Pain  HPI Grant [name] is a 47 y.o. male with a stated past medical history of hiatal hernia and kidney stones who presents complaining of central chest pressure that feels heavy with associated left arm heaviness and 2 episodes of emesis that began this morning.  Patient states he still feels a chest heaviness but the numbness in his arm has improved.  Patient received aspirin and nitroglycerin by EMS which she states somewhat improved his chest pressure.  Patient states that this has been coming on overnight and feels similar to previous episodes of chest pain that he has had in the past.  Patient is concerned as he has had a CT in April that showed he "needed a heart catheterization". ROS: Patient currently denies any vision changes, tinnitus, difficulty speaking, facial droop, sore throat, shortness of breath, abdominal pain, diarrhea, dysuria, or weakness/numbness/paresthesias in any extremity   Physical Exam  Triage Vital Signs: ED Triage Vitals  Encounter Vitals Group     BP      Systolic BP Percentile      Diastolic BP Percentile      Pulse      Resp      Temp      Temp src      SpO2      Weight      Height      Head Circumference      Peak Flow      Pain Score      Pain Loc      Pain Education      Exclude from Growth Chart    Most recent vital signs: Vitals:   02/10/23 0729  BP: 127/78  Pulse: 88  Resp: 20  Temp: 98.8 F (37.1 C)  SpO2: 95%   General: Awake, oriented x4. CV:  Good peripheral perfusion.  Resp:  Normal effort.  Abd:  No distention.  Other:  Middle-aged obese Caucasian male laying in bed in no acute distress ED Results / Procedures / Treatments  Labs (all labs ordered are listed, but only abnormal results are displayed) Labs Reviewed  COMPREHENSIVE METABOLIC PANEL - Abnormal; Notable  for the following components:      Result Value   Glucose, Bld 139 (*)    Calcium 8.7 (*)    All other components within normal limits  BRAIN NATRIURETIC PEPTIDE  CBC WITH DIFFERENTIAL/PLATELET  TROPONIN I (HIGH SENSITIVITY)  TROPONIN I (HIGH SENSITIVITY)   EKG ED ECG REPORT I, Merwyn Katos, the attending physician, personally viewed and interpreted this ECG. Date: 02/10/2023 EKG Time: 0722 Rate: 92 Rhythm: normal sinus rhythm QRS Axis: normal Intervals: normal ST/T Wave abnormalities: normal Narrative Interpretation: no evidence of acute ischemia RADIOLOGY ED MD interpretation: CT of the head without contrast interpreted by me shows no evidence of acute abnormalities including no intracerebral hemorrhage, obvious masses, or significant edema  One-view portable chest x-ray interpreted by me shows no evidence of acute abnormalities including no pneumonia, pneumothorax, or widened mediastinum -Agree with radiology assessment Official radiology report(s): CT Head Wo Contrast  Result Date: 02/10/2023 CLINICAL DATA:  Neuro deficit, acute, stroke suspected. EXAM: CT HEAD WITHOUT CONTRAST TECHNIQUE: Contiguous axial images were obtained from the base of the skull through the vertex without intravenous contrast. RADIATION DOSE REDUCTION: This exam was performed according to the departmental dose-optimization  program which includes automated exposure control, adjustment of the mA and/or kV according to patient size and/or use of iterative reconstruction technique. COMPARISON:  Head CT 02/21/2006.  MRI brain 03/04/2006. FINDINGS: Brain: No acute intracranial hemorrhage. Gray-white differentiation is preserved. No hydrocephalus or extra-axial collection. No mass effect or midline shift. Vascular: No hyperdense vessel or unexpected calcification. Skull: No calvarial fracture or suspicious bone lesion. Skull base is unremarkable. Sinuses/Orbits: Unremarkable. Other: None. IMPRESSION: No acute  intracranial abnormality. Electronically Signed   By: Orvan Falconer M.D.   On: 02/10/2023 08:10   DG Chest Port 1 View  Result Date: 02/10/2023 CLINICAL DATA:  Chest pain. EXAM: PORTABLE CHEST 1 VIEW COMPARISON:  01/30/2023. FINDINGS: Low lung volumes accentuate the pulmonary vasculature and cardiomediastinal silhouette. No consolidation or pulmonary edema. No pleural effusion or pneumothorax. IMPRESSION: Low lung volumes without evidence of acute cardiopulmonary disease. Electronically Signed   By: Orvan Falconer M.D.   On: 02/10/2023 08:07   PROCEDURES: Critical Care performed: No .1-3 Lead EKG Interpretation  Performed by: Merwyn Katos, MD Authorized by: Merwyn Katos, MD     Interpretation: normal     ECG rate:  71   ECG rate assessment: normal     Rhythm: sinus rhythm     Ectopy: none     Conduction: normal    MEDICATIONS ORDERED IN ED: Medications - No data to display IMPRESSION / MDM / ASSESSMENT AND PLAN / ED COURSE  I reviewed the triage vital signs and the nursing notes.                             The patient is on the cardiac monitor to evaluate for evidence of arrhythmia and/or significant heart rate changes. Patient's presentation is most consistent with acute presentation with potential threat to life or bodily function. Workup: ECG, CXR, CBC, BMP, Troponin Findings: ECG: No overt evidence of STEMI. No evidence of Brugadas sign, delta wave, epsilon wave, significantly prolonged QTc, or malignant arrhythmia HS Troponin: Negative x1 Other Labs unremarkable for emergent problems. CXR: Without PTX, PNA, or widened mediastinum Last Stress Test: Never Last Heart Catheterization: Never HEART Score: 5  Given History, Exam, and Workup I have low suspicion for ACS, Pneumothorax, Pneumonia, Pulmonary Embolus, Tamponade, Aortic Dissection or other emergent problem as a cause for this presentation.   High Risk Chest Pain Patient at increased risk for Major Adverse  Cardiac Event (AMI, PCI, CABG, death) Interventions: ASA 324mg  Defer Heparin drip as patient pain free at this time,   Disposition: Admit for continued cardiac monitoring and trending of troponins as well as further evaluation for potential inpatient stress testing vs cardiac catheterization and coronary angiography.   FINAL CLINICAL IMPRESSION(S) / ED DIAGNOSES   Final diagnoses:  Chest pain, unspecified type  Nausea and vomiting, unspecified vomiting type   Rx / DC Orders   ED Discharge Orders     None      Note:  This document was prepared using Dragon voice recognition software and may include unintentional dictation errors.   Merwyn Katos, MD 02/10/23 [phone]

## 2023-02-10 NOTE — Consult Note (Signed)
ANTICOAGULATION CONSULT NOTE - Initial Consult  Pharmacy Consult for Heparin infusion Indication: chest pain/ACS  No Known Allergies  Patient Measurements: Height: 6\' 1"  (185.4 cm) Weight: (!) 154.2 kg (340 lb) IBW/kg (Calculated) : 79.9 Heparin Dosing Weight: 116.2 kg  Vital Signs: Temp: 98.8 F (37.1 C) (07/18 0729) Temp Source: Oral (07/18 0729) BP: 127/78 (07/18 0729) Pulse Rate: 88 (07/18 0729)  Labs: Recent Labs    02/10/23 0728  HGB 14.4  HCT 44.0  PLT 201  CREATININE 1.14  TROPONINIHS 3    Estimated Creatinine Clearance: 125.5 mL/min (by C-G formula based on SCr of 1.14 mg/dL).  Medical History: Past Medical History:  Diagnosis Date   Kidney stones     Medications:  NO AC prior to admission noted  Assessment: 47 yo male admitted to ED with chest pain. PMH includes hiatal hernia, HLD, CAD and kidney stones. Pharmacy consulted to initiate heparin infusion for ACS.  Baseline Hgb and plt appropriate to start heparin infusion. Baseline aPTT and INR ordered to collect prior to heparin infusion started.  Goal of Therapy:  Heparin level 0.3-0.7 units/ml Monitor platelets by anticoagulation protocol: Yes   Plan:  Give 4000 units bolus x 1 Start heparin infusion at 1500 units/hr Check anti-Xa level in 6 hours and daily while on heparin Continue to monitor H&H and platelets  Json Koelzer Rodriguez-Guzman PharmD, BCPS 02/10/2023 9:48 AM

## 2023-02-10 NOTE — Interval H&P Note (Signed)
History and Physical Interval Note:  02/10/2023 2:00 PM  Grant [name]  has presented today for surgery, with the diagnosis of chest pain - concerning for Unstable Angina.  The various methods of treatment have been discussed with the patient and family. After consideration of risks, benefits and other options for treatment, the patient has consented to  Procedure(s): LEFT HEART CATH AND CORONARY ANGIOGRAPHY (N/A)  PERCUTANEOUS CORONARY INTERVENTION   as a surgical intervention.  The patient's history has been reviewed, patient examined, no change in status, stable for surgery.  I have reviewed the patient's chart and labs.  Questions were answered to the patient's satisfaction.    Cath Lab Visit (complete for each Cath Lab visit)  Clinical Evaluation Leading to the Procedure:   ACS: Yes.    Non-ACS:    Anginal Classification: CCS III  Anti-ischemic medical therapy: Minimal Therapy (1 class of medications)  Non-Invasive Test Results: Equivocal test results  Prior CABG: No previous CABG    [name]

## 2023-02-10 NOTE — Assessment & Plan Note (Signed)
High-dose statin Lipid panel pending

## 2023-02-10 NOTE — Progress Notes (Signed)
   02/10/23 2127  BiPAP/CPAP/SIPAP  Reason BIPAP/CPAP not in use Non-compliant   Patient sleeping, per wife at bedside patient has not had a sleep study and does not wear at home.

## 2023-02-10 NOTE — ED Triage Notes (Addendum)
Patient reports being awoken by chest pressure this AM. Described as feeling "heavy" on chest. Radiation to left arm and left arm feels numb. Two episodes of emesis  EMS administered 324 aspirin and 1 sublingual nitroglycerin tablet.  EMS vitals: 135/97 b/p 93% Ra 77P

## 2023-02-10 NOTE — H&P (Addendum)
History and Physical    Patient: Grant [name] ZOX:[id number] DOB: February 05, 1976 DOA: 02/10/2023 DOS: the patient was seen and examined on 02/10/2023 PCP: Patient, No Pcp Per  Patient coming from: Home  Chief Complaint:  Chief Complaint  Patient presents with   Chest Pain   HPI: Grant [name] is a 47 y.o. male with medical history significant of morbid obesity, history of kidney stones, hyperlipidemia presenting with chest pain.  Patient reports moderate to severe chest pain since earlier this morning.  Patient reports chest pain persisted into significant chest pressure upon waking.  Positive radiation up the neck and down left arm.  Patient reports recurring episodes of chest pain over several months.  Was evaluated by cardiology around March of this year.  Had coronary CT done which showed a coronary calcium score in the 99th percentile.  States he has been having intermittent episodes of mild central chest pain.  Symptoms were significantly worse today.  No fevers or chills.  No shortness of breath.  Mild nausea.  No belly pain or diarrhea.  Does not take aspirin daily.  Does report a significant family history of coronary disease in his father as well as grandmother.  Non-smoker.  Does drink once a week with friends.  Patient denies any known formal diagnosis of hypertension or diabetes.  Does take Crestor for hyperlipidemia.  Patient received baby aspirin x 4 as well as nitroglycerin with symptomatic improvement by EMS. Presented to the ER afebrile, hemodynamically stable.  White count 6.4, hemoglobin 14.4, platelets 201, creatinine 1.14, troponin 3, chest x-ray within normal limits.  EKG normal sinus rhythm with noted nonspecific changes.  Dr. Okey Dupre with cardiology consulted for formal evaluation at the bedside. Review of Systems: As mentioned in the history of present illness. All other systems reviewed and are negative. Past Medical History:  Diagnosis Date   Kidney stones    Past Surgical  History:  Procedure Laterality Date   APPENDECTOMY     TONSILLECTOMY     Social History:  reports that he has never smoked. He has never used smokeless tobacco. He reports current alcohol use. He reports that he does not currently use drugs.  No Known Allergies  Family History  Problem Relation Age of Onset   CAD Father     Prior to Admission medications   Medication Sig Start Date End Date Taking? Authorizing Provider  omeprazole (PRILOSEC) 40 MG capsule Take 1 capsule (40 mg total) by mouth daily. 10/22/22  Yes Croitoru, Mihai, MD  rosuvastatin (CRESTOR) 20 MG tablet Take 1 tablet (20 mg total) by mouth daily. 11/21/22 02/19/23 Yes Croitoru, Mihai, MD  doxycycline (VIBRAMYCIN) 100 MG capsule Take 1 capsule (100 mg total) by mouth 2 (two) times daily. Patient not taking: Reported on 10/22/2022 08/24/22   Arthor Captain, PA-C  metoprolol tartrate (LOPRESSOR) 100 MG tablet Take 1 tablet (100 mg total) by mouth once for 1 dose. PLEASE TAKE METOPROLOL 2  HOURS PRIOR TO CTA SCAN. 10/22/22 10/22/22  Croitoru, Mihai, MD  ondansetron (ZOFRAN-ODT) 4 MG disintegrating tablet Dissolve 1 tablet under the tongue every 8 (eight) hours as needed for nausea or vomiting. Patient not taking: Reported on 10/22/2022 04/29/22   Henderly, Britni A, PA-C  oxyCODONE-acetaminophen (PERCOCET/ROXICET) 5-325 MG tablet Take 1-2 tablets by mouth every 6 (six) hours as needed for severe pain. Patient not taking: Reported on 10/22/2022 05/06/22   Molpus, John, MD  tamsulosin (FLOMAX) 0.4 MG CAPS capsule Take 1 capsule (0.4 mg total) by mouth  daily. Patient not taking: Reported on 10/22/2022 04/29/22   Henderly, Britni A, PA-C    Physical Exam: Vitals:   02/10/23 0729 02/10/23 0730  BP: 127/78   Pulse: 88   Resp: 20   Temp: 98.8 F (37.1 C)   TempSrc: Oral   SpO2: 95%   Weight:  (!) 154.2 kg  Height:  6\' 1"  (1.854 m)   Physical Exam Constitutional:      Appearance: He is obese.  HENT:     Head: Normocephalic and  atraumatic.     Nose: Nose normal.     Mouth/Throat:     Mouth: Mucous membranes are moist.  Eyes:     Pupils: Pupils are equal, round, and reactive to light.  Cardiovascular:     Rate and Rhythm: Normal rate and regular rhythm.  Pulmonary:     Effort: Pulmonary effort is normal.  Abdominal:     General: Bowel sounds are normal.     Comments: Obese abdomen    Musculoskeletal:        General: Normal range of motion.  Skin:    General: Skin is warm.  Neurological:     General: No focal deficit present.  Psychiatric:        Mood and Affect: Mood normal.     Data Reviewed:  There are no new results to review at this time. CT Head Wo Contrast CLINICAL DATA:  Neuro deficit, acute, stroke suspected.  EXAM: CT HEAD WITHOUT CONTRAST  TECHNIQUE: Contiguous axial images were obtained from the base of the skull through the vertex without intravenous contrast.  RADIATION DOSE REDUCTION: This exam was performed according to the departmental dose-optimization program which includes automated exposure control, adjustment of the mA and/or kV according to patient size and/or use of iterative reconstruction technique.  COMPARISON:  Head CT 02/21/2006.  MRI brain 03/04/2006.  FINDINGS: Brain: No acute intracranial hemorrhage. Gray-white differentiation is preserved. No hydrocephalus or extra-axial collection. No mass effect or midline shift.  Vascular: No hyperdense vessel or unexpected calcification.  Skull: No calvarial fracture or suspicious bone lesion. Skull base is unremarkable.  Sinuses/Orbits: Unremarkable.  Other: None.  IMPRESSION: No acute intracranial abnormality.  Electronically Signed   By: Orvan Falconer M.D.   On: 02/10/2023 08:10 DG Chest Port 1 View CLINICAL DATA:  Chest pain.  EXAM: PORTABLE CHEST 1 VIEW  COMPARISON:  01/30/2023.  FINDINGS: Low lung volumes accentuate the pulmonary vasculature and cardiomediastinal silhouette. No  consolidation or pulmonary edema. No pleural effusion or pneumothorax.  IMPRESSION: Low lung volumes without evidence of acute cardiopulmonary disease.  Electronically Signed   By: Orvan Falconer M.D.   On: 02/10/2023 08:07  Lab Results  Component Value Date   WBC 6.4 02/10/2023   HGB 14.4 02/10/2023   HCT 44.0 02/10/2023   MCV 83.8 02/10/2023   PLT 201 02/10/2023   Last metabolic panel Lab Results  Component Value Date   GLUCOSE 139 (H) 02/10/2023   NA 136 02/10/2023   K 4.0 02/10/2023   CL 104 02/10/2023   CO2 25 02/10/2023   BUN 16 02/10/2023   CREATININE 1.14 02/10/2023   GFRNONAA >60 02/10/2023   CALCIUM 8.7 (L) 02/10/2023   PROT 6.5 02/10/2023   ALBUMIN 3.6 02/10/2023   BILITOT 0.7 02/10/2023   ALKPHOS 70 02/10/2023   AST 20 02/10/2023   ALT 22 02/10/2023   ANIONGAP 7 02/10/2023    Assessment and Plan: * Chest pain Patient with severe chest pain and chest pressure  today with baseline recurrent chest pain over multiple months and noted coronary calcium score in the 99 percentile, coronary calcium score of 1078, mid/distal RCA difficult to evaluate, mild stenosis in prox/mid LAD, prox LCX and prox RCA.Obstructive CAD could not be excluded on coronary CT April 2024 Troponin negative x 1 EKG normal sinus rhythm Heart score 6  Dr. Okey Dupre with cardiology at the bedside with plan for cardiac catheterization today Heparin drip started in the ER 2D echo Risk stratification labs Follow-up formal cardiology recommendations  Obesity, Class III, BMI 40-49.9 (morbid obesity) (HCC) BMI 45   Hyperlipidemia High-dose statin Lipid panel pending       Advance Care Planning:   Code Status: Full Code   Consults: Cardiology- Dr. Okey Dupre   Family Communication: Wife at the bedside   Severity of Illness: The appropriate patient status for this patient is OBSERVATION. Observation status is judged to be reasonable and necessary in order to provide the required intensity of  service to ensure the patient's safety. The patient's presenting symptoms, physical exam findings, and initial radiographic and laboratory data in the context of their medical condition is felt to place them at decreased risk for further clinical deterioration. Furthermore, it is anticipated that the patient will be medically stable for discharge from the hospital within 2 midnights of admission.   Author: Floydene Flock, MD 02/10/2023 9:44 AM  For on call review [email or web address].

## 2023-02-10 NOTE — Assessment & Plan Note (Signed)
BMI 45 

## 2023-02-10 NOTE — Consult Note (Signed)
Cardiology Consultation   Patient ID: TAJON MORING MRN: [id number]; DOB: Jun 18, 1976  Admit date: 02/10/2023 Date of Consult: 02/10/2023  PCP:  Patient, No Pcp Per   [facility] Providers Cardiologist:  Thurmon Fair, MD   {   Patient Profile:   Grant [name] is a 47 y.o. male with a hx of HLD, GERD, CAD by Cardiac CTA 10/2022, OSA, who is being seen 02/10/2023 for the evaluation of chest pain at the request of Dr. Alvester Morin.  History of Present Illness:   Mr. Grant [name] was seen by Dr. Angelena Sole in March 2024 with chest pain and dyspnea. A cardiac CTA and an echocardiogram were ordered. Cardiac CTA 10/2022 showed 1078, 99th percentile for age and sex matched, mild stenosis in the LAD and pLCX and proximal RCA. RCA difficult to evaluate, appeared to have moderate stenosis in mid/distal RCA but cannot exclude more significant obstruction. Unable to send for FFR due to artifact. Obstructive CAD could not be excluded. He did not get the echo performed.   Family history of CAD, father had bypass x7 in his late 86s. He dips. NO drug or alcohol history. He works at a [employer].   The patient presented to [facility] ED 02/10/23 with chest pain and SOB. This morning when he was waking up he started experiencing chest pain that wen down the left arm. It slowly got worse and he felt left arm go numb. Pain got up 7/10. He felt nauseous and vomited. EMS was called who gave him 4 baby aspirin and SLNTG with improvement.   In the ER HS trop 4. WBC 6.4, Hgb 14.4. BNP 5.2. EKG showed NSR TWI aVL. The patient reported persistent  4/10 chest pain. He was started on IV heparin and admitted with plan for heart cath.   Past Medical History:  Diagnosis Date   Kidney stones     Past Surgical History:  Procedure Laterality Date   APPENDECTOMY     TONSILLECTOMY       Home Medications:  Prior to Admission medications   Medication Sig Start Date End Date Taking? Authorizing Provider  omeprazole  (PRILOSEC) 40 MG capsule Take 1 capsule (40 mg total) by mouth daily. 10/22/22  Yes Croitoru, Mihai, MD  rosuvastatin (CRESTOR) 20 MG tablet Take 1 tablet (20 mg total) by mouth daily. 11/21/22 02/19/23 Yes Croitoru, Mihai, MD  doxycycline (VIBRAMYCIN) 100 MG capsule Take 1 capsule (100 mg total) by mouth 2 (two) times daily. Patient not taking: Reported on 10/22/2022 08/24/22   Arthor Captain, PA-C  metoprolol tartrate (LOPRESSOR) 100 MG tablet Take 1 tablet (100 mg total) by mouth once for 1 dose. PLEASE TAKE METOPROLOL 2  HOURS PRIOR TO CTA SCAN. 10/22/22 10/22/22  Croitoru, Mihai, MD  ondansetron (ZOFRAN-ODT) 4 MG disintegrating tablet Dissolve 1 tablet under the tongue every 8 (eight) hours as needed for nausea or vomiting. Patient not taking: Reported on 10/22/2022 04/29/22   Henderly, Britni A, PA-C  oxyCODONE-acetaminophen (PERCOCET/ROXICET) 5-325 MG tablet Take 1-2 tablets by mouth every 6 (six) hours as needed for severe pain. Patient not taking: Reported on 10/22/2022 05/06/22   Molpus, John, MD  tamsulosin (FLOMAX) 0.4 MG CAPS capsule Take 1 capsule (0.4 mg total) by mouth daily. Patient not taking: Reported on 10/22/2022 04/29/22   Henderly, Britni A, PA-C    Inpatient Medications: Scheduled Meds:  heparin  4,000 Units Intravenous Once   rosuvastatin  40 mg Oral Daily   Continuous Infusions:  heparin  PRN Meds: acetaminophen, ondansetron (ZOFRAN) IV  Allergies:   No Known Allergies  Social History:   Social History   Socioeconomic History   Marital status: Married    Spouse name: Not on file   Number of children: Not on file   Years of education: Not on file   Highest education level: Not on file  Occupational History   Not on file  Tobacco Use   Smoking status: Never   Smokeless tobacco: Never  Substance and Sexual Activity   Alcohol use: Yes    Comment: Seldom   Drug use: Not Currently   Sexual activity: Not Currently  Other Topics Concern   Not on file  Social  History Narrative   Not on file   Social Determinants of Health   Financial Resource Strain: Not on file  Food Insecurity: Not on file  Transportation Needs: Not on file  Physical Activity: Not on file  Stress: Not on file  Social Connections: Unknown (12/05/2021)   Received from [facility]   Social Network    Social Network: Not on file  Intimate Partner Violence: Unknown (10/29/2021)   Received from [facility]   HITS    Physically Hurt: Not on file    Insult or Talk Down To: Not on file    Threaten Physical Harm: Not on file    Scream or Curse: Not on file    Family History:   Family History  Problem Relation Age of Onset   CAD Father      ROS:  Please see the history of present illness.  All other ROS reviewed and negative.     Physical Exam/Data:   Vitals:   02/10/23 0729 02/10/23 0730  BP: 127/78   Pulse: 88   Resp: 20   Temp: 98.8 F (37.1 C)   TempSrc: Oral   SpO2: 95%   Weight:  (!) 154.2 kg  Height:  6\' 1"  (1.854 m)   No intake or output data in the 24 hours ending 02/10/23 0957    02/10/2023    7:30 AM 01/30/2023   11:30 PM 12/13/2022    7:37 AM  Last 3 Weights  Weight (lbs) 340 lb 345 lb 345 lb  Weight (kg) 154.223 kg 156.491 kg 156.491 kg     Body mass index is 44.86 kg/m.  General:  Well nourished, well developed, in no acute distress HEENT: normal Neck: no JVD Vascular: No carotid bruits; Distal pulses 2+ bilaterally Cardiac:  normal S1, S2; RRR; no murmur  Lungs:  clear to auscultation bilaterally, no wheezing, rhonchi or rales  Abd: soft, nontender, no hepatomegaly  Ext: no edema Musculoskeletal:  No deformities, BUE and BLE strength normal and equal Skin: warm and dry  Neuro:  CNs 2-12 intact, no focal abnormalities noted Psych:  Normal affect   EKG:  The EKG was personally reviewed and demonstrates:  NSR 92bpm, TWI aVL Telemetry:  Telemetry was personally reviewed and demonstrates:  NSR 80s  Relevant CV Studies:  Cardia CTA  10/2022 IMPRESSION: 1. Coronary calcium score of 1078. This was 99th percentile for age and sex matched control.   2.  Normal coronary origin with right dominance.   3. Poor quality study. There is significant noise likely secondary to obesity, step artifacts from cardiac motion, and severe coronary calcifications. Recommend alternative imaging modality   4. RCA is difficult to evaluate, appears moderate stenosis in mid/distal RCA but cannot exclude more significant obstruction. Unable to send for CTFFR, was  rejected due to artifact. Recommend alternative evaluation as above   5. Mild (25-49%) stenosis in proximal/mid LAD, proximal LCX, and proximal RCA   6.  Dilated main pulmonary artery measuring 31mm   CAD-RADS N Non-diagnostic study. Obstructive CAD can't be excluded. Alternative evaluation is recommended.   Electronically Signed: By: Epifanio Lesches M.D. On: 11/14/2022 15:54    Laboratory Data:  High Sensitivity Troponin:   Recent Labs  Lab 01/30/23 2331 01/31/23 0141 02/10/23 0728  TROPONINIHS 5 4 3      Chemistry Recent Labs  Lab 02/10/23 0728  NA 136  K 4.0  CL 104  CO2 25  GLUCOSE 139*  BUN 16  CREATININE 1.14  CALCIUM 8.7*  GFRNONAA >60  ANIONGAP 7    Recent Labs  Lab 02/10/23 0728  PROT 6.5  ALBUMIN 3.6  AST 20  ALT 22  ALKPHOS 70  BILITOT 0.7   Lipids No results for input(s): "CHOL", "TRIG", "HDL", "LABVLDL", "LDLCALC", "CHOLHDL" in the last 168 hours.  Hematology Recent Labs  Lab 02/10/23 0728  WBC 6.4  RBC 5.25  HGB 14.4  HCT 44.0  MCV 83.8  MCH 27.4  MCHC 32.7  RDW 12.7  PLT 201   Thyroid No results for input(s): "TSH", "FREET4" in the last 168 hours.  BNP Recent Labs  Lab 02/10/23 0728  BNP 5.2    DDimer No results for input(s): "DDIMER" in the last 168 hours.   Radiology/Studies:  CT Head Wo Contrast  Result Date: 02/10/2023 CLINICAL DATA:  Neuro deficit, acute, stroke suspected. EXAM: CT HEAD WITHOUT  CONTRAST TECHNIQUE: Contiguous axial images were obtained from the base of the skull through the vertex without intravenous contrast. RADIATION DOSE REDUCTION: This exam was performed according to the departmental dose-optimization program which includes automated exposure control, adjustment of the mA and/or kV according to patient size and/or use of iterative reconstruction technique. COMPARISON:  Head CT 02/21/2006.  MRI brain 03/04/2006. FINDINGS: Brain: No acute intracranial hemorrhage. Gray-white differentiation is preserved. No hydrocephalus or extra-axial collection. No mass effect or midline shift. Vascular: No hyperdense vessel or unexpected calcification. Skull: No calvarial fracture or suspicious bone lesion. Skull base is unremarkable. Sinuses/Orbits: Unremarkable. Other: None. IMPRESSION: No acute intracranial abnormality. Electronically Signed   By: Orvan Falconer M.D.   On: 02/10/2023 08:10   DG Chest Port 1 View  Result Date: 02/10/2023 CLINICAL DATA:  Chest pain. EXAM: PORTABLE CHEST 1 VIEW COMPARISON:  01/30/2023. FINDINGS: Low lung volumes accentuate the pulmonary vasculature and cardiomediastinal silhouette. No consolidation or pulmonary edema. No pleural effusion or pneumothorax. IMPRESSION: Low lung volumes without evidence of acute cardiopulmonary disease. Electronically Signed   By: Orvan Falconer M.D.   On: 02/10/2023 08:07     Assessment and Plan:   Unstable Angina CAD - presented with chest pain radiating to left arm. Initial Hs trop 4. EKG non-ishcemic - Cardiac CTA in 10/2022 with CAD, coronary calcium score of 1078, mid/distal RCA difficult to evaluate, mild stenosis in prox/mid LAD, prox LCX and prox RCA.Obstructive CAD could not be excluded, could not send for FFR -  patient still with active chest pain 4/10 - start IV heparin - continue to trend troponin - check echo - start ASA, continue statin and start a BB - Plan for LHC today Risks and benefits of cardiac  catheterization have been discussed with the patient.  These include bleeding, infection, kidney damage, stroke, heart attack, death.  The patient understands these risks and is willing to proceed.  HLD - LDL 119, HDL 28, TG 83, total chol 161 - Crestor increased to 40mg  daily - re-check lipid panel  OSA - needs CPAP   For questions or updates, please contact [facility] Please consult [email or web address] for contact info under    Signed, Ayjah Show David Stall, PA-C  02/10/2023 9:57 AM

## 2023-02-11 ENCOUNTER — Observation Stay (HOSPITAL_BASED_OUTPATIENT_CLINIC_OR_DEPARTMENT_OTHER)
Admit: 2023-02-11 | Discharge: 2023-02-11 | Disposition: A | Discharge status: Home / Self Care | Payer: BC Managed Care – PPO | Attending: Cardiology | Admitting: Cardiology

## 2023-02-11 ENCOUNTER — Other Ambulatory Visit (HOSPITAL_COMMUNITY): Payer: Self-pay

## 2023-02-11 ENCOUNTER — Observation Stay
Admit: 2023-02-11 | Discharge: 2023-02-11 | Disposition: A | Discharge status: Home / Self Care | Payer: BC Managed Care – PPO | Attending: Cardiology | Admitting: Cardiology

## 2023-02-11 ENCOUNTER — Encounter: Payer: Self-pay | Admitting: Cardiology

## 2023-02-11 DIAGNOSIS — E782 Mixed hyperlipidemia: Secondary | ICD-10-CM

## 2023-02-11 DIAGNOSIS — I200 Unstable angina: Secondary | ICD-10-CM

## 2023-02-11 DIAGNOSIS — R931 Abnormal findings on diagnostic imaging of heart and coronary circulation: Secondary | ICD-10-CM

## 2023-02-11 DIAGNOSIS — I2081 Angina pectoris with coronary microvascular dysfunction: Secondary | ICD-10-CM

## 2023-02-11 DIAGNOSIS — R112 Nausea with vomiting, unspecified: Secondary | ICD-10-CM | POA: Diagnosis not present

## 2023-02-11 DIAGNOSIS — R079 Chest pain, unspecified: Secondary | ICD-10-CM | POA: Diagnosis not present

## 2023-02-11 LAB — CBC
HCT: 44.9 % (ref 39.0–52.0)
Hemoglobin: 14.8 g/dL (ref 13.0–17.0)
MCH: 28 pg (ref 26.0–34.0)
MCHC: 33 g/dL (ref 30.0–36.0)
MCV: 84.9 fL (ref 80.0–100.0)
Platelets: 211 10*3/uL (ref 150–400)
RBC: 5.29 MIL/uL (ref 4.22–5.81)
RDW: 12.7 % (ref 11.5–15.5)
WBC: 8.2 10*3/uL (ref 4.0–10.5)
nRBC: 0 % (ref 0.0–0.2)

## 2023-02-11 LAB — ECHOCARDIOGRAM COMPLETE
AR max vel: 2.71 cm2
AV Area VTI: 2.64 cm2
AV Area mean vel: 2.53 cm2
AV Mean grad: 4 mmHg
AV Peak grad: 7.6 mmHg
Ao pk vel: 1.38 m/s
Area-P 1/2: 2.6 cm2
Calc EF: 49.7 %
Height: 73 in
MV VTI: 2.44 cm2
S' Lateral: 3.5 cm
Single Plane A2C EF: 50 %
Single Plane A4C EF: 52.6 %
Weight: 5439.19 oz

## 2023-02-11 LAB — LIPID PANEL
Cholesterol: 160 mg/dL (ref 0–200)
HDL: 22 mg/dL — ABNORMAL LOW (ref >40)
LDL Cholesterol: 117 mg/dL — ABNORMAL HIGH (ref 0–99)
Total CHOL/HDL Ratio: 7.3 RATIO
Triglycerides: 103 mg/dL (ref <150)
VLDL: 21 mg/dL (ref 0–40)

## 2023-02-11 LAB — BASIC METABOLIC PANEL
Anion gap: 7 (ref 5–15)
BUN: 11 mg/dL (ref 6–20)
CO2: 27 mmol/L (ref 22–32)
Calcium: 8.7 mg/dL — ABNORMAL LOW (ref 8.9–10.3)
Chloride: 104 mmol/L (ref 98–111)
Creatinine, Ser: 0.84 mg/dL (ref 0.61–1.24)
GFR, Estimated: >60 mL/min (ref >60)
Glucose, Bld: 127 mg/dL — ABNORMAL HIGH (ref 70–99)
Potassium: 3.9 mmol/L (ref 3.5–5.1)
Sodium: 138 mmol/L (ref 135–145)

## 2023-02-11 MED ORDER — PANTOPRAZOLE SODIUM 40 MG PO TBEC
40.0000 mg | DELAYED_RELEASE_TABLET | Freq: Every day | ORAL | Status: DC
  Administered 2023-02-11: 40 mg via ORAL

## 2023-02-11 MED ORDER — PANTOPRAZOLE SODIUM 40 MG PO TBEC
DELAYED_RELEASE_TABLET | ORAL | Status: AC
  Filled 2023-02-11: qty 1

## 2023-02-11 MED ORDER — TICAGRELOR 90 MG PO TABS: 90.0000 mg | ORAL_TABLET | Freq: Two times a day (BID) | ORAL | 11 refills | Status: DC

## 2023-02-11 MED ORDER — METOPROLOL SUCCINATE ER 25 MG PO TB24: 25.0000 mg | ORAL_TABLET | Freq: Every day | ORAL | 1 refills | Status: DC

## 2023-02-11 MED ORDER — ASPIRIN 81 MG PO TBEC: 81.0000 mg | DELAYED_RELEASE_TABLET | Freq: Every day | ORAL | 12 refills | Status: DC

## 2023-02-11 MED ORDER — ROSUVASTATIN CALCIUM 40 MG PO TABS: 40.0000 mg | ORAL_TABLET | Freq: Every day | ORAL | 1 refills | Status: AC

## 2023-02-11 NOTE — Progress Notes (Signed)
*  PRELIMINARY RESULTS* Echocardiogram 2D Echocardiogram has been performed.  Grant [name] 02/11/2023, 12:37 PM

## 2023-02-11 NOTE — Progress Notes (Signed)
Transition of Care [facility]) - Inpatient Brief Assessment   Patient Details  Name: Grant [name] MRN: [id number] Date of Birth: 1975-08-10  Transition of Care [facility]) CM/SW Contact:    Darolyn Rua, LCSW Phone Number: 02/11/2023, 9:43 AM   Clinical Narrative:  Patient has BCBS Commercial PPO, patient is followed by cardiology but no PCP, patient can go to [email or web address] to choose PCP in network with his insurance for for follow up. This has been added to AVS.   Anticipate discharge home with self care once medically cleared, no additional needs noted at this time.   Transition of Care Asessment: Insurance and Status: Insurance coverage has been reviewed Patient has primary care physician: No (patient has insurance, website provided on avs to pick pcp) Home environment has been reviewed: home /self care Prior level of function:: self care Prior/Current Home Services: No current home services Social Determinants of Health Reivew: SDOH reviewed no interventions necessary Readmission risk has been reviewed: Yes Transition of care needs: no transition of care needs at this time

## 2023-02-11 NOTE — Discharge Instructions (Signed)
Please go to [email or web address] to choose a primary care doctor in network with your insurance to become established for follow up care.

## 2023-02-11 NOTE — Hospital Course (Addendum)
Taken  from H&P  Grant [name] is a 47 y.o. male with medical history significant of morbid obesity, history of kidney stones, hyperlipidemia and family history of premature coronary artery disease (father had MI and CABG in his late 50s), presenting with chest pain.   He has never had chest pain like this before but has experienced shortness of breath with mild activities over the last few months, prompting evaluation by Grant [name] and coronary CTA. This showed severe coronary artery calcification with mild to moderate disease involving the left coronary artery.   Patient denies any known formal diagnosis of hypertension or diabetes.  Does take Crestor for hyperlipidemia.  Patient received baby aspirin x 4 as well as nitroglycerin with symptomatic improvement by EMS.  ED Course: Presented to the ER afebrile, hemodynamically stable.  White count 6.4, hemoglobin 14.4, platelets 201, creatinine 1.14, troponin 3, chest x-ray within normal limits.  EKG normal sinus rhythm with noted nonspecific changes.   Cardiology was consulted and patient was taken to cath lab.  7/19: Vital stable with borderline soft BP. Cath yesterday with   Prox RCA lesion is 15% stenosed.   CULPRIT LESION mid RCA lesion is 90% stenosed. ->  Heavily fibrous focal concentric.  TIMI-3 flow   After scoring balloon angioplasty was performed using a BALLN SCOREFLEX 3.50X15, A drug-eluting stent was successfully placed using a STENT ONYX FRONTIER 3.5X22.  The stent was then postdilated and tapered fashion from 4.1 to 4.0 mm.  Post intervention, there is a 0% residual stenosis. TIMI-3 flow post   Prox Cx to Mid Cx lesion is 50% stenosed.   Ost LAD to Prox LAD lesion is 15% stenosed.   The Left Ventricular systolic function is normal. The Left Ventricular Ejection Fraction is 55-65% by visual estimate.   LV end diastolic pressure is normal.     Successful complex ScoreFlex PTCA based angioplasty and DES PCI of CULPRIT LESION (mid  RCA fibrous 90%) with Onyx Frontier DES 3.5 mm x 22 mm postdilated tapered fashion from 4.1 to 4.0 mm.   Patient was started in Aspirin 81mg  daily and Ticagrelor 90mg  twice daily for a minimum of 12 months.He was also referred for cardiac Rehab.   Remained CP free and able to ambulate.  Life style modifications were discussed with patient and wife. Patient should stop smoking, limit alcohol intake and loose weight to improve quality of life and decreased the risk of further CAD and CVD.  He will continue on current medications and follow up with his cardiologist for further recommendations.

## 2023-02-11 NOTE — Discharge Summary (Signed)
Physician Discharge Summary   Patient: Grant [name] MRN: [id number] DOB: Feb 18, 1976  Admit date:     02/10/2023  Discharge date: 02/11/23  Discharge Physician: Arnetha Courser   PCP: Patient, No Pcp Per   Recommendations at discharge:   Please obtain CBC and BMP in one week. Follow up with PCP Follow up with Cardiology.  Discharge Diagnoses: Principal Problem:   Chest pain Active Problems:   Hyperlipidemia   Obesity, Class III, BMI 40-49.9 (morbid obesity) (HCC)   Unstable angina (HCC)   Abnormal cardiac CT angiography   Nausea and vomiting   Hospital Course: Taken  from H&P  Grant [name] is a 47 y.o. male with medical history significant of morbid obesity, history of kidney stones, hyperlipidemia and family history of premature coronary artery disease (father had MI and CABG in his late 7s), presenting with chest pain.   He has never had chest pain like this before but has experienced shortness of breath with mild activities over the last few months, prompting evaluation by Dr. Royann Shivers and coronary CTA. This showed severe coronary artery calcification with mild to moderate disease involving the left coronary artery.   Patient denies any known formal diagnosis of hypertension or diabetes.  Does take Crestor for hyperlipidemia.  Patient received baby aspirin x 4 as well as nitroglycerin with symptomatic improvement by EMS.  ED Course: Presented to the ER afebrile, hemodynamically stable.  White count 6.4, hemoglobin 14.4, platelets 201, creatinine 1.14, troponin 3, chest x-ray within normal limits.  EKG normal sinus rhythm with noted nonspecific changes.   Cardiology was consulted and patient was taken to cath lab.  7/19: Vital stable with borderline soft BP. Cath yesterday with   Prox RCA lesion is 15% stenosed.   CULPRIT LESION mid RCA lesion is 90% stenosed. ->  Heavily fibrous focal concentric.  TIMI-3 flow   After scoring balloon angioplasty was performed using a BALLN  SCOREFLEX 3.50X15, A drug-eluting stent was successfully placed using a STENT ONYX FRONTIER 3.5X22.  The stent was then postdilated and tapered fashion from 4.1 to 4.0 mm.  Post intervention, there is a 0% residual stenosis. TIMI-3 flow post   Prox Cx to Mid Cx lesion is 50% stenosed.   Ost LAD to Prox LAD lesion is 15% stenosed.   The Left Ventricular systolic function is normal. The Left Ventricular Ejection Fraction is 55-65% by visual estimate.   LV end diastolic pressure is normal.     Successful complex ScoreFlex PTCA based angioplasty and DES PCI of CULPRIT LESION (mid RCA fibrous 90%) with Onyx Frontier DES 3.5 mm x 22 mm postdilated tapered fashion from 4.1 to 4.0 mm.   Patient was started in Aspirin 81mg  daily and Ticagrelor 90mg  twice daily for a minimum of 12 months.He was also referred for cardiac Rehab.   Remained CP free and able to ambulate.  Life style modifications were discussed with patient and wife. Patient should stop smoking, limit alcohol intake and loose weight to improve quality of life and decreased the risk of further CAD and CVD.  He will continue on current medications and follow up with his cardiologist for further recommendations.    Assessment and Plan: * Chest pain Patient with severe chest pain and chest pressure today with baseline recurrent chest pain over multiple months and noted coronary calcium score in the 99 percentile, coronary calcium score of 1078, mid/distal RCA difficult to evaluate, mild stenosis in prox/mid LAD, prox LCX and prox RCA.Obstructive CAD could not  be excluded on coronary CT April 2024 Troponin negative x 1 EKG normal sinus rhythm Heart score 6  Dr. Okey Dupre with cardiology at the bedside with plan for cardiac catheterization s/p PCI Heparin drip started in the ER 2D echo  Obesity, Class III, BMI 40-49.9 (morbid obesity) (HCC) BMI 45. Encourage weight loss.   Hyperlipidemia High-dose statin   Consultants:  Cardiology Procedures performed: [facility]  Disposition: Home Diet recommendation:  Discharge Diet Orders (From admission, onward)     Start     Ordered   02/11/23 0000  Diet - low sodium heart healthy        02/11/23 1120           Cardiac and Carb modified diet DISCHARGE MEDICATION: Allergies as of 02/11/2023   No Known Allergies      Medication List     STOP taking these medications    doxycycline 100 MG capsule Commonly known as: VIBRAMYCIN   metoprolol tartrate 100 MG tablet Commonly known as: LOPRESSOR   ondansetron 4 MG disintegrating tablet Commonly known as: ZOFRAN-ODT   oxyCODONE-acetaminophen 5-325 MG tablet Commonly known as: PERCOCET/ROXICET   tamsulosin 0.4 MG Caps capsule Commonly known as: FLOMAX       TAKE these medications    aspirin EC 81 MG tablet Take 1 tablet (81 mg total) by mouth daily. Swallow whole. Start taking on: February 12, 2023   metoprolol succinate 25 MG 24 hr tablet Commonly known as: TOPROL-XL Take 1 tablet (25 mg total) by mouth daily. Start taking on: February 12, 2023   omeprazole 40 MG capsule Commonly known as: PRILOSEC Take 1 capsule (40 mg total) by mouth daily.   rosuvastatin 40 MG tablet Commonly known as: CRESTOR Take 1 tablet (40 mg total) by mouth daily. Start taking on: February 12, 2023 What changed:  medication strength how much to take   ticagrelor 90 MG Tabs tablet Commonly known as: BRILINTA Take 1 tablet (90 mg total) by mouth 2 (two) times daily.        Follow-up Information     Croitoru, Mihai, MD. Schedule an appointment as soon as possible for a visit in 1 week(s).   Specialty: Cardiology Contact information: [address] [place] [address] [phone]                Discharge Exam: Ceasar Mons Weights   02/10/23 0730 02/10/23 1305  Weight: (!) 154.2 kg (!) 154.2 kg   General. Morbidly obese gentleman, In no acute distress. Pulmonary.  Lungs clear bilaterally, normal  respiratory effort. CV.  Regular rate and rhythm, no JVD, rub or murmur. Abdomen.  Soft, nontender, nondistended, BS positive. CNS.  Alert and oriented .  No focal neurologic deficit. Extremities.  No edema, no cyanosis, pulses intact and symmetrical. Psychiatry.  Judgment and insight appears normal.   Condition at discharge: stable  The results of significant diagnostics from this hospitalization (including imaging, microbiology, ancillary and laboratory) are listed below for reference.   Imaging Studies: CARDIAC CATHETERIZATION  Result Date: 02/10/2023   Prox RCA lesion is 15% stenosed.   CULPRIT LESION mid RCA lesion is 90% stenosed. ->  Heavily fibrous focal concentric.  TIMI-3 flow   After scoring balloon angioplasty was performed using a BALLN SCOREFLEX 3.50X15, A drug-eluting stent was successfully placed using a STENT ONYX FRONTIER 3.5X22.  The stent was then postdilated and tapered fashion from 4.1 to 4.0 mm.  Post intervention, there is a 0% residual stenosis. TIMI-3 flow post  Prox Cx to Mid Cx lesion is 50% stenosed.   Ost LAD to Prox LAD lesion is 15% stenosed.   The Left Ventricular systolic function is normal. The Left Ventricular Ejection Fraction is 55-65% by visual estimate.   LV end diastolic pressure is normal. Successful complex ScoreFlex PTCA based angioplasty and DES PCI of CULPRIT LESION (mid RCA fibrous 90%) with Onyx Frontier DES 3.5 mm x 22 mm postdilated tapered fashion from 4.1 to 4.0 mm. RECOMMENDATIONS Overnight monitoring with TR band removal and gentle hydration. Continue to titrate GDMT for CAD-PCI   In the absence of any other complications or medical issues, we expect the patient to be ready for discharge from an interventional cardiology perspective on 02/11/2023.   Recommend uninterrupted dual antiplatelet therapy with Aspirin 81mg  daily and Ticagrelor 90mg  twice daily for a minimum of 12 months (ACS-Class I recommendation). [name], MD  CT Head Wo  Contrast  Result Date: 02/10/2023 CLINICAL DATA:  Neuro deficit, acute, stroke suspected. EXAM: CT HEAD WITHOUT CONTRAST TECHNIQUE: Contiguous axial images were obtained from the base of the skull through the vertex without intravenous contrast. RADIATION DOSE REDUCTION: This exam was performed according to the departmental dose-optimization program which includes automated exposure control, adjustment of the mA and/or kV according to patient size and/or use of iterative reconstruction technique. COMPARISON:  Head CT 02/21/2006.  MRI brain 03/04/2006. FINDINGS: Brain: No acute intracranial hemorrhage. Gray-white differentiation is preserved. No hydrocephalus or extra-axial collection. No mass effect or midline shift. Vascular: No hyperdense vessel or unexpected calcification. Skull: No calvarial fracture or suspicious bone lesion. Skull base is unremarkable. Sinuses/Orbits: Unremarkable. Other: None. IMPRESSION: No acute intracranial abnormality. Electronically Signed   By: Orvan Falconer M.D.   On: 02/10/2023 08:10   DG Chest Port 1 View  Result Date: 02/10/2023 CLINICAL DATA:  Chest pain. EXAM: PORTABLE CHEST 1 VIEW COMPARISON:  01/30/2023. FINDINGS: Low lung volumes accentuate the pulmonary vasculature and cardiomediastinal silhouette. No consolidation or pulmonary edema. No pleural effusion or pneumothorax. IMPRESSION: Low lung volumes without evidence of acute cardiopulmonary disease. Electronically Signed   By: Orvan Falconer M.D.   On: 02/10/2023 08:07   DG Chest 2 View  Result Date: 01/30/2023 CLINICAL DATA:  Chest pain and shortness of breath. EXAM: CHEST - 2 VIEW COMPARISON:  12/13/2022, cardiac CT 11/12/2018 FINDINGS: The cardiomediastinal contours are normal. Hiatal hernia. Pulmonary vasculature is normal. No consolidation, pleural effusion, or pneumothorax. No acute osseous abnormalities are seen. IMPRESSION: 1. No acute chest findings. 2. Hiatal hernia. Electronically Signed   By: Narda Rutherford M.D.   On: 01/30/2023 23:55    Microbiology: Results for orders placed or performed during the hospital encounter of 01/30/23  SARS Coronavirus 2 by RT PCR (hospital order, performed in [facility] hospital lab) *cepheid single result test* Anterior Nasal Swab     Status: None   Collection Time: 01/31/23 12:55 AM   Specimen: Anterior Nasal Swab  Result Value Ref Range Status   SARS Coronavirus 2 by RT PCR NEGATIVE NEGATIVE Final    Comment: (NOTE) SARS-CoV-2 target nucleic acids are NOT DETECTED.  The SARS-CoV-2 RNA is generally detectable in upper and lower respiratory specimens during the acute phase of infection. The lowest concentration of SARS-CoV-2 viral copies this assay can detect is 250 copies / mL. A negative result does not preclude SARS-CoV-2 infection and should not be used as the sole basis for treatment or other patient management decisions.  A negative result may occur with improper  specimen collection / handling, submission of specimen other than nasopharyngeal swab, presence of viral mutation(s) within the areas targeted by this assay, and inadequate number of viral copies (<250 copies / mL). A negative result must be combined with clinical observations, patient history, and epidemiological information.  Fact Sheet for Patients:   RoadLapTop.co.za  Fact Sheet for Healthcare Providers: http://kim-miller.com/  This test is not yet approved or  cleared by the [place] FDA and has been authorized for detection and/or diagnosis of SARS-CoV-2 by FDA under an Emergency Use Authorization (EUA).  This EUA will remain in effect (meaning this test can be used) for the duration of the COVID-19 declaration under Section 564(b)(1) of the Act, 21 U.S.C. section 360bbb-3(b)(1), unless the authorization is terminated or revoked sooner.  Performed at [facility], [address] [place], [place] [address]      Labs: CBC: Recent Labs  Lab 02/10/23 0728 02/11/23 0912  WBC 6.4 8.2  NEUTROABS 4.0  --   HGB 14.4 14.8  HCT 44.0 44.9  MCV 83.8 84.9  PLT 201 211   Basic Metabolic Panel: Recent Labs  Lab 02/10/23 0728 02/11/23 0912  NA 136 138  K 4.0 3.9  CL 104 104  CO2 25 27  GLUCOSE 139* 127*  BUN 16 11  CREATININE 1.14 0.84  CALCIUM 8.7* 8.7*   Liver Function Tests: Recent Labs  Lab 02/10/23 0728  AST 20  ALT 22  ALKPHOS 70  BILITOT 0.7  PROT 6.5  ALBUMIN 3.6   CBG: No results for input(s): "GLUCAP" in the last 168 hours.  Discharge time spent: greater than 30 minutes.  This record has been created using Conservation officer, historic buildings. Errors have been sought and corrected,but may not always be located. Such creation errors do not reflect on the standard of care.   Signed: Arnetha Courser, MD [facility] 02/11/2023

## 2023-02-11 NOTE — TOC Benefit Eligibility Note (Signed)
Pharmacy Patient Advocate Encounter  Insurance verification completed.    The patient is insured through [employer] for Brilitna 90 mg and the current 30 day co-pay is $50.00.   This test claim was processed through [facility]- copay amounts may vary at other pharmacies due to pharmacy/plan contracts, or as the patient moves through the different stages of their insurance plan.    Roland Earl, CPHT Pharmacy Patient Advocate Specialist [facility] Patient Advocate Team Direct Number: [phone]  Fax: [phone]

## 2023-02-11 NOTE — Plan of Care (Signed)
  Problem: Education: Goal: Understanding of cardiac disease, CV risk reduction, and recovery process will improve Outcome: Adequate for Discharge Goal: Individualized Educational Video(s) Outcome: Adequate for Discharge   Problem: Activity: Goal: Ability to tolerate increased activity will improve Outcome: Adequate for Discharge   Problem: Cardiac: Goal: Ability to achieve and maintain adequate cardiovascular perfusion will improve Outcome: Adequate for Discharge   Problem: Health Behavior/Discharge Planning: Goal: Ability to safely manage health-related needs after discharge will improve Outcome: Adequate for Discharge   Problem: Education: Goal: Understanding of CV disease, CV risk reduction, and recovery process will improve Outcome: Adequate for Discharge Goal: Individualized Educational Video(s) Outcome: Adequate for Discharge   Problem: Activity: Goal: Ability to return to baseline activity level will improve Outcome: Adequate for Discharge   Problem: Cardiovascular: Goal: Ability to achieve and maintain adequate cardiovascular perfusion will improve Outcome: Adequate for Discharge Goal: Vascular access site(s) Level 0-1 will be maintained Outcome: Adequate for Discharge   Problem: Health Behavior/Discharge Planning: Goal: Ability to safely manage health-related needs after discharge will improve Outcome: Adequate for Discharge   Problem: Education: Goal: Knowledge of General Education information will improve Description: Including pain rating scale, medication(s)/side effects and non-pharmacologic comfort measures Outcome: Adequate for Discharge   Problem: Health Behavior/Discharge Planning: Goal: Ability to manage health-related needs will improve Outcome: Adequate for Discharge   Problem: Clinical Measurements: Goal: Ability to maintain clinical measurements within normal limits will improve Outcome: Adequate for Discharge Goal: Will remain free from  infection Outcome: Adequate for Discharge Goal: Diagnostic test results will improve Outcome: Adequate for Discharge Goal: Respiratory complications will improve Outcome: Adequate for Discharge Goal: Cardiovascular complication will be avoided Outcome: Adequate for Discharge   Problem: Activity: Goal: Risk for activity intolerance will decrease Outcome: Adequate for Discharge   Problem: Nutrition: Goal: Adequate nutrition will be maintained Outcome: Adequate for Discharge   Problem: Coping: Goal: Level of anxiety will decrease Outcome: Adequate for Discharge   Problem: Elimination: Goal: Will not experience complications related to bowel motility Outcome: Adequate for Discharge Goal: Will not experience complications related to urinary retention Outcome: Adequate for Discharge   Problem: Pain Managment: Goal: General experience of comfort will improve Outcome: Adequate for Discharge   Problem: Safety: Goal: Ability to remain free from injury will improve Outcome: Adequate for Discharge   Problem: Skin Integrity: Goal: Risk for impaired skin integrity will decrease Outcome: Adequate for Discharge   

## 2023-02-11 NOTE — Progress Notes (Signed)
Rounding Note    Patient Name: Grant [name] Date of Encounter: 02/11/2023  [facility] Cardiologist: Thurmon Fair, MD   Subjective   Patient is overall doing well. No chest pain or SOB. He received balloon angioplasty and sent placement to [facility]. Cath site, right radial, is stable. He is ambulating without anginal symptoms.   Inpatient Medications    Scheduled Meds:  aspirin EC  81 mg Oral Daily   metoprolol succinate  25 mg Oral Daily   pantoprazole  40 mg Oral Daily   rosuvastatin  40 mg Oral Daily   sodium chloride flush  3 mL Intravenous Q12H   ticagrelor  90 mg Oral BID   Continuous Infusions:  sodium chloride     PRN Meds: sodium chloride, acetaminophen, morphine injection, ondansetron (ZOFRAN) IV, sodium chloride flush   Vital Signs    Vitals:   02/10/23 1954 02/10/23 2347 02/11/23 0354 02/11/23 0700  BP: (!) 116/51 (!) 114/51 (!) 108/55 (!) 102/55  Pulse: 69 70 65 76  Resp: 18 18  18   Temp: 98.2 F (36.8 C) 97.9 F (36.6 C) 97.6 F (36.4 C) (!) 97.4 F (36.3 C)  TempSrc:   Oral Oral  SpO2: 97% 97% 96% 100%  Weight:      Height:        Intake/Output Summary (Last 24 hours) at 02/11/2023 0840 Last data filed at 02/11/2023 0416 Gross per 24 hour  Intake 1179 ml  Output --  Net 1179 ml      02/10/2023    1:05 PM 02/10/2023    7:30 AM 01/30/2023   11:30 PM  Last 3 Weights  Weight (lbs) 339 lb 15.2 oz 340 lb 345 lb  Weight (kg) 154.2 kg 154.223 kg 156.491 kg      Telemetry    NSR PVCs, HF 60 - Personally Reviewed  ECG    No new - Personally Reviewed  Physical Exam   GEN: No acute distress.   Neck: No JVD Cardiac: RRR, no murmurs, rubs, or gallops.  Respiratory: Clear to auscultation bilaterally. GI: Soft, nontender, non-distended  MS: No edema; No deformity. Neuro:  Nonfocal  Psych: Normal affect   Labs    High Sensitivity Troponin:   Recent Labs  Lab 01/30/23 2331 01/31/23 0141 02/10/23 0728 02/10/23 0949   TROPONINIHS 5 4 3 3      Chemistry Recent Labs  Lab 02/10/23 0728  NA 136  K 4.0  CL 104  CO2 25  GLUCOSE 139*  BUN 16  CREATININE 1.14  CALCIUM 8.7*  PROT 6.5  ALBUMIN 3.6  AST 20  ALT 22  ALKPHOS 70  BILITOT 0.7  GFRNONAA >60  ANIONGAP 7    Lipids No results for input(s): "CHOL", "TRIG", "HDL", "LABVLDL", "LDLCALC", "CHOLHDL" in the last 168 hours.  Hematology Recent Labs  Lab 02/10/23 0728  WBC 6.4  RBC 5.25  HGB 14.4  HCT 44.0  MCV 83.8  MCH 27.4  MCHC 32.7  RDW 12.7  PLT 201   Thyroid No results for input(s): "TSH", "FREET4" in the last 168 hours.  BNP Recent Labs  Lab 02/10/23 0728  BNP 5.2    DDimer No results for input(s): "DDIMER" in the last 168 hours.   Radiology    CARDIAC CATHETERIZATION  Result Date: 02/10/2023   Prox RCA lesion is 15% stenosed.   CULPRIT LESION mid RCA lesion is 90% stenosed. ->  Heavily fibrous focal concentric.  TIMI-3 flow   After scoring  balloon angioplasty was performed using a BALLN SCOREFLEX 3.50X15, A drug-eluting stent was successfully placed using a STENT ONYX FRONTIER 3.5X22.  The stent was then postdilated and tapered fashion from 4.1 to 4.0 mm.  Post intervention, there is a 0% residual stenosis. TIMI-3 flow post   Prox Cx to Mid Cx lesion is 50% stenosed.   Ost LAD to Prox LAD lesion is 15% stenosed.   The Left Ventricular systolic function is normal. The Left Ventricular Ejection Fraction is 55-65% by visual estimate.   LV end diastolic pressure is normal. Successful complex ScoreFlex PTCA based angioplasty and DES PCI of CULPRIT LESION (mid RCA fibrous 90%) with Onyx Frontier DES 3.5 mm x 22 mm postdilated tapered fashion from 4.1 to 4.0 mm. RECOMMENDATIONS Overnight monitoring with TR band removal and gentle hydration. Continue to titrate GDMT for CAD-PCI   In the absence of any other complications or medical issues, we expect the patient to be ready for discharge from an interventional cardiology perspective on  02/11/2023.   Recommend uninterrupted dual antiplatelet therapy with Aspirin 81mg  daily and Ticagrelor 90mg  twice daily for a minimum of 12 months (ACS-Class I recommendation). [name], MD  CT Head Wo Contrast  Result Date: 02/10/2023 CLINICAL DATA:  Neuro deficit, acute, stroke suspected. EXAM: CT HEAD WITHOUT CONTRAST TECHNIQUE: Contiguous axial images were obtained from the base of the skull through the vertex without intravenous contrast. RADIATION DOSE REDUCTION: This exam was performed according to the departmental dose-optimization program which includes automated exposure control, adjustment of the mA and/or kV according to patient size and/or use of iterative reconstruction technique. COMPARISON:  Head CT 02/21/2006.  MRI brain 03/04/2006. FINDINGS: Brain: No acute intracranial hemorrhage. Gray-white differentiation is preserved. No hydrocephalus or extra-axial collection. No mass effect or midline shift. Vascular: No hyperdense vessel or unexpected calcification. Skull: No calvarial fracture or suspicious bone lesion. Skull base is unremarkable. Sinuses/Orbits: Unremarkable. Other: None. IMPRESSION: No acute intracranial abnormality. Electronically Signed   By: Orvan Falconer M.D.   On: 02/10/2023 08:10   DG Chest Port 1 View  Result Date: 02/10/2023 CLINICAL DATA:  Chest pain. EXAM: PORTABLE CHEST 1 VIEW COMPARISON:  01/30/2023. FINDINGS: Low lung volumes accentuate the pulmonary vasculature and cardiomediastinal silhouette. No consolidation or pulmonary edema. No pleural effusion or pneumothorax. IMPRESSION: Low lung volumes without evidence of acute cardiopulmonary disease. Electronically Signed   By: Orvan Falconer M.D.   On: 02/10/2023 08:07    Cardiac Studies   LHC 02/10/23    Prox RCA lesion is 15% stenosed.   CULPRIT LESION mid RCA lesion is 90% stenosed. ->  Heavily fibrous focal concentric.  TIMI-3 flow   After scoring balloon angioplasty was performed using a BALLN  SCOREFLEX 3.50X15, A drug-eluting stent was successfully placed using a STENT ONYX FRONTIER 3.5X22.  The stent was then postdilated and tapered fashion from 4.1 to 4.0 mm.  Post intervention, there is a 0% residual stenosis. TIMI-3 flow post   Prox Cx to Mid Cx lesion is 50% stenosed.   Ost LAD to Prox LAD lesion is 15% stenosed.   The Left Ventricular systolic function is normal. The Left Ventricular Ejection Fraction is 55-65% by visual estimate.   LV end diastolic pressure is normal.     Successful complex ScoreFlex PTCA based angioplasty and DES PCI of CULPRIT LESION (mid RCA fibrous 90%) with Onyx Frontier DES 3.5 mm x 22 mm postdilated tapered fashion from 4.1 to 4.0 mm.      RECOMMENDATIONS Overnight  monitoring with TR band removal and gentle hydration. Continue to titrate GDMT for CAD-PCI   In the absence of any other complications or medical issues, we expect the patient to be ready for discharge from an interventional cardiology perspective on 02/11/2023.   Recommend uninterrupted dual antiplatelet therapy with Aspirin 81mg  daily and Ticagrelor 90mg  twice daily for a minimum of 12 months (ACS-Class I recommendation). [name], MD     Antiplatelet/Anticoag Recommend uninterrupted dual antiplatelet therapy with Aspirin 81mg  daily and Ticagrelor 90mg  twice daily for a minimum of 12 months (ACS-Class I recommendation).  Discharge Date In the absence of any other complications or medical issues, we expect the patient to be ready for discharge from an interventional cardiology perspective on 02/11/2023.    Cardiac CTA 10/2022   IMPRESSION: 1. Coronary calcium score of 1078. This was 99th percentile for age and sex matched control.   2.  Normal coronary origin with right dominance.   3. Poor quality study. There is significant noise likely secondary to obesity, step artifacts from cardiac motion, and severe coronary calcifications. Recommend alternative imaging modality    4. RCA is difficult to evaluate, appears moderate stenosis in mid/distal RCA but cannot exclude more significant obstruction. Unable to send for CTFFR, was rejected due to artifact. Recommend alternative evaluation as above   5. Mild (25-49%) stenosis in proximal/mid LAD, proximal LCX, and proximal RCA   6.  Dilated main pulmonary artery measuring 31mm   CAD-RADS N Non-diagnostic study. Obstructive CAD can't be excluded. Alternative evaluation is recommended.   Electronically Signed: By: Epifanio Lesches M.D. On: 11/14/2022 15:54  Patient Profile     47 y.o. male ith a hx of HLD, GERD, CAD by Cardiac CTA 10/2022, OSA, who is being seen 02/10/2023 for the evaluation of chest pain   Assessment & Plan    Unstable Angina CAD - presented with chest pain radiating to left arm. Troponin negative. EKG non-ischemic. Started on IV heparin - Cardiac CTA in 10/2022 with CAD, coronary calcium score of 1078, mid/distal RCA difficult to evaluate, mild stenosis in prox/mid LAD, prox LCX and prox RCA.Obstructive CAD could not be excluded, could not send for [facility] -  patient still had active chest pain 4/10 and was taken for a heart cath - LHC showed culprit lesion mid RCA 90% stenosis, treated with balloon angioplasty and DES placement. LVEF 55-60%, normal LVEDP - the patient was placed on DAPT with Aspirin and Ticagrelor 90mg  BID for 12 months - echo ordered - Continue Asa, Ticagrelor, Toprol, Crestor - cath site is stable - AM labs pending - he has f/u, can likely be d/c'd if echo is OK  HLD - LDL 119, HDL 28, TG 83, total chol 841 - Crestor increased to 40mg  daily - re-check lipid panel   OSA - needs CPAP   For questions or updates, please contact [facility] Please consult [email or web address] for contact info under        Signed, Chardonnay Holzmann David Stall, PA-C  02/11/2023, 8:40 AM

## 2023-02-14 LAB — LIPOPROTEIN A (LPA): Lipoprotein (a): 151.2 nmol/L — ABNORMAL HIGH (ref <75.0)

## 2023-02-14 LAB — POCT ACTIVATED CLOTTING TIME
Activated Clotting Time: 324 s
Activated Clotting Time: 244 seconds
Activated Clotting Time: 256 seconds

## 2023-02-17 ENCOUNTER — Ambulatory Visit: Payer: BC Managed Care – PPO | Admitting: Cardiovascular Disease

## 2023-02-17 ENCOUNTER — Encounter: Payer: Self-pay | Admitting: Cardiovascular Disease

## 2023-02-17 VITALS — BP 106/80 | HR 78 | Ht 73.0 in | Wt 354.6 lb

## 2023-02-17 DIAGNOSIS — E785 Hyperlipidemia, unspecified: Secondary | ICD-10-CM

## 2023-02-17 DIAGNOSIS — I700 Atherosclerosis of aorta: Secondary | ICD-10-CM

## 2023-02-17 DIAGNOSIS — I25110 Atherosclerotic heart disease of native coronary artery with unstable angina pectoris: Secondary | ICD-10-CM | POA: Diagnosis not present

## 2023-02-17 DIAGNOSIS — Z79899 Other long term (current) drug therapy: Secondary | ICD-10-CM

## 2023-02-17 DIAGNOSIS — E7841 Elevated Lipoprotein(a): Secondary | ICD-10-CM

## 2023-02-17 DIAGNOSIS — G4733 Obstructive sleep apnea (adult) (pediatric): Secondary | ICD-10-CM | POA: Diagnosis not present

## 2023-02-17 NOTE — Patient Instructions (Addendum)
Medication Instructions:  No changes *If you need a refill on your cardiac medications before your next appointment, please call your pharmacy*  Lab Work: Lipid panel- Please return for Blood Work in 3 months. No appointment needed, lab here at the office is open Monday-Friday from 8AM to 4PM and closed daily for lunch from 12:45-1:45.   If you have labs (blood work) drawn today and your tests are completely normal, you will receive your results only by: MyChart Message (if you have MyChart) OR A paper copy in the mail If you have any lab test that is abnormal or we need to change your treatment, we will call you to review the results.  Follow-Up: At [facility], you and your health needs are our priority.  As part of our continuing mission to provide you with exceptional heart care, we have created designated Provider Care Teams.  These Care Teams include your primary Cardiologist (physician) and Advanced Practice Providers (APPs -  Physician Assistants and Nurse Practitioners) who all work together to provide you with the care you need, when you need it.  We recommend signing up for the patient portal called "MyChart".  Sign up information is provided on this After Visit Summary.  MyChart is used to connect with patients for Virtual Visits (Telemedicine).  Patients are able to view lab/test results, encounter notes, upcoming appointments, etc.  Non-urgent messages can be sent to your provider as well.   To learn more about what you can do with MyChart, go to [email or web address].    Your next appointment:   PharmD- for [facility]  6 month(s) with Dr Royann Shivers

## 2023-02-17 NOTE — Progress Notes (Signed)
Cardiology Office Note:    Date:  02/17/2023   ID:  Grant [name], DOB 01-18-1976, MRN [id number]  PCP:  Patient, No Pcp Per   [facility] Providers Cardiologist:  Thurmon Fair, MD     Referring MD: No ref. provider found   Chief Complaint  Patient presents with   Coronary Artery Disease    History of Present Illness:    Grant [name] is a 47 y.o. male with a hx of morbid obesity and nephrolithiasis who has recently developed problems with dyspnea and chest heaviness.  He had a poor quality CT angiogram earlier this year that was interpreted as showing moderate stenosis of the mid-distal RCA, but with concerned that it might have been underestimated due to the study quality.  His calcium score was markedly elevated at 1078 (99th percentile).  He had abrupt onset of severe chest discomfort 02/10/2023 and underwent urgent coronary angiography which showed a 90% mid RCA stenosis.  This was felt to be heavily fibrous.  He received a drug-eluting stent (Onyx frontier 3.5 x 22) and has been asymptomatic ever since.  He had a 50% proximal-mid left circumflex stenosis and a 15% ostial LAD stenosis.  Wall motion and overall LV systolic function were normal.  Echocardiogram the next today showed normal LV function and regional wall motion.  His ascending aorta was described as mildly dilated at 44 mm, although it was not described as being dilated on his CT from April.  Troponin was negative.  He did develop a small hematoma of the right forearm from a catheterization and still has some tenderness and therefore limited range of motion in that wrist.  He has normal pulses and normal sensation in his fingers.  Is not had any further episodes of chest discomfort either at rest or with activity.  He denies shortness of breath, orthopnea, PND, lower extreme edema, claudication.  He has severe snoring, his wife reports frequent episodes of apnea, although he reports waking up feeling  pretty refreshed in the morning.  He is unable to watch a movie or a ball game without falling asleep.  STOP-BANG score is 7. He has not yet had a sleep study.  He does not smoke or have diabetes mellitus.  He has severely low HDL cholesterol measured at only 22.  He has markedly elevated LP(a) at 151.  He has normal renal function.  Aortic atherosclerosis was incidentally noted on a CT of the abdomen performed in October 2023 for kidney stones.  His father Grant [name] has a longstanding history of coronary artery disease (onset at age 43) and severe ischemic cardiomyopathy and has a defibrillator.  Past Medical History:  Diagnosis Date   Kidney stones     Past Surgical History:  Procedure Laterality Date   APPENDECTOMY     CORONARY STENT INTERVENTION N/A 02/10/2023   Procedure: CORONARY STENT INTERVENTION;  Surgeon: Marykay Lex, MD;  Location: [facility] INVASIVE CV LAB;  Service: Cardiovascular;  Laterality: N/A;   LEFT HEART CATH AND CORONARY ANGIOGRAPHY N/A 02/10/2023   Procedure: LEFT HEART CATH AND CORONARY ANGIOGRAPHY;  Surgeon: Marykay Lex, MD;  Location: [facility] INVASIVE CV LAB;  Service: Cardiovascular;  Laterality: N/A;   TONSILLECTOMY      Current Medications: Current Meds  Medication Sig   aspirin EC 81 MG tablet Take 1 tablet (81 mg total) by mouth daily. Swallow whole.   metoprolol succinate (TOPROL-XL) 25 MG 24 hr tablet Take 1 tablet (25 mg total) by mouth  daily.   omeprazole (PRILOSEC) 40 MG capsule Take 1 capsule (40 mg total) by mouth daily.   rosuvastatin (CRESTOR) 40 MG tablet Take 1 tablet (40 mg total) by mouth daily.   ticagrelor (BRILINTA) 90 MG TABS tablet Take 1 tablet (90 mg total) by mouth 2 (two) times daily.     Allergies:   Patient has no known allergies.   Social History   Socioeconomic History   Marital status: Married    Spouse name: Not on file   Number of children: Not on file   Years of education: Not on file   Highest education level: Not on  file  Occupational History   Not on file  Tobacco Use   Smoking status: Never   Smokeless tobacco: Current    Types: Chew  Substance and Sexual Activity   Alcohol use: Yes    Comment: Seldom   Drug use: Not Currently   Sexual activity: Not Currently  Other Topics Concern   Not on file  Social History Narrative   Not on file   Social Determinants of Health   Financial Resource Strain: Not on file  Food Insecurity: Not on file  Transportation Needs: Not on file  Physical Activity: Not on file  Stress: Not on file  Social Connections: Unknown (12/05/2021)   Received from [facility]   Social Network    Social Network: Not on file     Family History: The patient's father has early onset CAD at age 98 with myocardial infarction, congestive heart failure and implantable defibrillator.  ROS:   Please see the history of present illness.     All other systems reviewed and are negative.  EKGs/Labs/Other Studies Reviewed:    The following studies were reviewed today: Cardiac catheterization 02/10/2023    Prox RCA lesion is 15% stenosed.   CULPRIT LESION mid RCA lesion is 90% stenosed. ->  Heavily fibrous focal concentric.  TIMI-3 flow   After scoring balloon angioplasty was performed using a BALLN SCOREFLEX 3.50X15, A drug-eluting stent was successfully placed using a STENT ONYX FRONTIER 3.5X22.  The stent was then postdilated and tapered fashion from 4.1 to 4.0 mm.  Post intervention, there is a 0% residual stenosis. TIMI-3 flow post   Prox Cx to Mid Cx lesion is 50% stenosed.   Ost LAD to Prox LAD lesion is 15% stenosed.   The Left Ventricular systolic function is normal. The Left Ventricular Ejection Fraction is 55-65% by visual estimate.   LV end diastolic pressure is normal.  Echocardiogram 02/11/2023    1. Left ventricular ejection fraction, by estimation, is 50 to 55%. The  left ventricle has low normal function. The left ventricle has no regional  wall motion  abnormalities. There is moderate left ventricular hypertrophy.  Left ventricular diastolic  parameters were normal.   2. Right ventricular systolic function is normal. The right ventricular  size is normal.   3. The mitral valve is normal in structure. No evidence of mitral valve  regurgitation.   4. The aortic valve is grossly normal. Aortic valve regurgitation is not  visualized.   5. Aortic dilatation noted. There is mild dilatation of the aortic root,  measuring 44 mm.      Successful complex ScoreFlex PTCA based angioplasty and DES PCI of CULPRIT LESION (mid RCA fibrous 90%) with Onyx Frontier DES 3.5 mm x 22 mm postdilated tapered fashion from 4.1 to 4.0 mm.     EKG:  EKG is not ordered today.  The ekg ordered 08/24/2022 was personally reviewed and demonstrates sinus tachycardia with occasional PACs, incomplete right bundle branch block with QRS 109 ms, subtle nonspecific ST segment elevation in multiple leads, normal QTc 422 ms.  Recent Labs: 02/10/2023: ALT 22; B Natriuretic Peptide 5.2 02/11/2023: BUN 11; Creatinine, Ser 0.84; Hemoglobin 14.8; Platelets 211; Potassium 3.9; Sodium 138  Recent Lipid Panel    Component Value Date/Time   CHOL 160 02/11/2023 0912   CHOL 163 11/19/2022 0812   TRIG 103 02/11/2023 0912   HDL 22 (L) 02/11/2023 0912   HDL 28 (L) 11/19/2022 0812   CHOLHDL 7.3 02/11/2023 0912   VLDL 21 02/11/2023 0912   LDLCALC 117 (H) 02/11/2023 0912   LDLCALC 119 (H) 11/19/2022 0812     Risk Assessment/Calculations:           STOP-Bang Score:  7       Physical Exam:    VS:  BP 106/80 (BP Location: Left Arm, Patient Position: Sitting, Cuff Size: Large)   Pulse 78   Ht 6\' 1"  (1.854 m)   Wt (!) 354 lb 9.6 oz (160.8 kg)   SpO2 96%   BMI 46.78 kg/m     Wt Readings from Last 3 Encounters:  02/17/23 (!) 354 lb 9.6 oz (160.8 kg)  02/10/23 (!) 339 lb 15.2 oz (154.2 kg)  01/30/23 (!) 345 lb (156.5 kg)      General: Alert, oriented x3, no distress,  morbidly obese. Head: Has some retrognathia, no evidence of trauma, PERRL, EOMI, no exophtalmos or lid lag, no myxedema, no xanthelasma; normal ears, nose and oropharynx Neck: normal jugular venous pulsations and no hepatojugular reflux; brisk carotid pulses without delay and no carotid bruits Chest: clear to auscultation, no signs of consolidation by percussion or palpation, normal fremitus, symmetrical and full respiratory excursions Cardiovascular: normal position and quality of the apical impulse, regular rhythm, normal first and second heart sounds, no murmurs, rubs or gallops Abdomen: no tenderness or distention, no masses by palpation, no abnormal pulsatility or arterial bruits, normal bowel sounds, no hepatosplenomegaly Extremities: no clubbing, cyanosis or edema; 2+ radial, ulnar and brachial pulses bilaterally; 2+ right femoral, posterior tibial and dorsalis pedis pulses; 2+ left femoral, posterior tibial and dorsalis pedis pulses; no subclavian or femoral bruits Neurological: grossly nonfocal Psych: Normal mood and affect   ASSESSMENT:    1. Coronary artery disease involving native coronary artery of native heart with unstable angina pectoris (HCC)   2. Dyslipidemia (high LDL; low HDL)   3. Elevated Lp(a)   4. OSA (obstructive sleep apnea)   5. Atherosclerosis of aorta (HCC)   6. Obesity, Class III, BMI 40-49.9 (morbid obesity) (HCC)   7. Medication management    PLAN:    In order of problems listed above:  CAD: Currently asymptomatic after stent to mid RCA.  Did not have infarction and has normal LV function.  Reviewed mandatory uninterrupted dual antiplatelet therapy for the first 6 months, preferably continued for 12 months.  Need aggressive risk factor management with lifestyle changes, lipid-lowering agents.  Continue beta-blockers Dyslipidemia: He has very low HDL cholesterol and has a markedly elevated LP(a).  For the time being he is on rosuvastatin 40 mg daily.  Will see  if this provides satisfactory reduction in LDL (I would recommend a target less than 55).  Low threshold to add a PCSK9 inhibitor.  Currently no clinical trials enrolling for LP(a) treatment. OSA: Extremely likely diagnosis.  We were unable to do an itamar home  sleep study since he does not use a smart phone, and have not yet successfully scheduled him for a split-night study.  Need to follow-up on this.  He is morbidly obese and has a relatively small mandible, at this point unlikely to be a candidate for inspire. Aortic atherosclerosis: Incidentally noted on a CT that was performed when he had barely turned 47 years old.  Although the aorta was described as dilated on the echo, I think the measurement on the CT is more reliable.. Morbid obesity: His wife has encouraged him to undergo bariatric surgery but he has no desire to do so.  Today we talked about A GLP-1 agonist such as ZOXWRU and he does appear to be interested.  Referred to our Pharm.D. clinic.    Cardiac Rehabilitation Eligibility Assessment            Medication Adjustments/Labs and Tests Ordered: Current medicines are reviewed at length with the patient today.  Concerns regarding medicines are outlined above.  Orders Placed This Encounter  Procedures   Lipid panel   AMB Referral to [facility] Pharm-D   EKG 12-Lead   No orders of the defined types were placed in this encounter.   Patient Instructions  Medication Instructions:  No changes *If you need a refill on your cardiac medications before your next appointment, please call your pharmacy*  Lab Work: Lipid panel- Please return for Blood Work in 3 months. No appointment needed, lab here at the office is open Monday-Friday from 8AM to 4PM and closed daily for lunch from 12:45-1:45.   If you have labs (blood work) drawn today and your tests are completely normal, you will receive your results only by: MyChart Message (if you have MyChart) OR A paper copy in the mail If  you have any lab test that is abnormal or we need to change your treatment, we will call you to review the results.  Follow-Up: At [facility], you and your health needs are our priority.  As part of our continuing mission to provide you with exceptional heart care, we have created designated Provider Care Teams.  These Care Teams include your primary Cardiologist (physician) and Advanced Practice Providers (APPs -  Physician Assistants and Nurse Practitioners) who all work together to provide you with the care you need, when you need it.  We recommend signing up for the patient portal called "MyChart".  Sign up information is provided on this After Visit Summary.  MyChart is used to connect with patients for Virtual Visits (Telemedicine).  Patients are able to view lab/test results, encounter notes, upcoming appointments, etc.  Non-urgent messages can be sent to your provider as well.   To learn more about what you can do with MyChart, go to [email or web address].    Your next appointment:   PharmD- for [facility]  6 month(s) with Dr Royann Shivers       Signed, Thurmon Fair, MD  02/17/2023 2:42 PM    [facility]

## 2023-02-22 ENCOUNTER — Telehealth: Payer: Self-pay

## 2023-02-22 NOTE — Telephone Encounter (Signed)
Spoke with patient and verified insurance information. Patient provided Group and Subscriber ID to help process his Insurance claim for a Split night Sleep Study

## 2023-03-04 ENCOUNTER — Ambulatory Visit: Payer: BC Managed Care – PPO | Admitting: Cardiovascular Disease

## 2023-03-11 ENCOUNTER — Encounter: Payer: Self-pay | Admitting: Pharmacist

## 2023-03-11 ENCOUNTER — Telehealth: Payer: Self-pay | Admitting: Pharmacist

## 2023-03-11 ENCOUNTER — Ambulatory Visit: Payer: BC Managed Care – PPO | Attending: Cardiovascular Disease | Admitting: Pharmacist

## 2023-03-11 VITALS — BP 121/85 | HR 72 | Ht 73.0 in | Wt 348.6 lb

## 2023-03-11 NOTE — Progress Notes (Unsigned)
Patient ID: ANGELDANIEL PEPITONE                 DOB: 06-17-1976                    MRN: [id number]     HPI: Grant [name] is a 47 y.o. male patient referred to pharmacy clinic by Dr Royann Shivers to initiate GLP1-RA therapy. PMH is significant for CAD, HLD, angina , and obesity. Most recent BMI 46.79.  Patient presents today with wife. Wife was on GLP1a before bariatric surgery so is familiar with mechanism and process of obtaining medication.  Patient works at [employer]. Typically only eats one large meal per day in the evening. Was drinking multiple energy drinks a day but has stopped.   BMI at visit today 46 kg/m2.   Labs: No results found for: "HGBA1C"  Wt Readings from Last 1 Encounters:  02/17/23 (!) 354 lb 9.6 oz (160.8 kg)    BP Readings from Last 1 Encounters:  02/17/23 106/80   Pulse Readings from Last 1 Encounters:  02/17/23 78       Component Value Date/Time   CHOL 160 02/11/2023 0912   CHOL 163 11/19/2022 0812   TRIG 103 02/11/2023 0912   HDL 22 (L) 02/11/2023 0912   HDL 28 (L) 11/19/2022 0812   CHOLHDL 7.3 02/11/2023 0912   VLDL 21 02/11/2023 0912   LDLCALC 117 (H) 02/11/2023 0912   LDLCALC 119 (H) 11/19/2022 4098    Past Medical History:  Diagnosis Date   Kidney stones     Current Outpatient Medications on File Prior to Visit  Medication Sig Dispense Refill   aspirin EC 81 MG tablet Take 1 tablet (81 mg total) by mouth daily. Swallow whole. 30 tablet 12   metoprolol succinate (TOPROL-XL) 25 MG 24 hr tablet Take 1 tablet (25 mg total) by mouth daily. 90 tablet 1   omeprazole (PRILOSEC) 40 MG capsule Take 1 capsule (40 mg total) by mouth daily. 90 capsule 3   rosuvastatin (CRESTOR) 40 MG tablet Take 1 tablet (40 mg total) by mouth daily. 90 tablet 1   ticagrelor (BRILINTA) 90 MG TABS tablet Take 1 tablet (90 mg total) by mouth 2 (two) times daily. 60 tablet 11   No current facility-administered medications on file prior to visit.    No Known  Allergies   Assessment/Plan:  1. Weight loss - Patient BMI 46 kg/m2 placing him in severe obesity category. Patient would benefit from [facility]. Discussed pathophysiology of weight gain and role that diet, exercise, and medications can play in achieving weight loss.  Discussed diet in detail with patient. Encouraged increasing vegetables, lean proteins, healthy fats and whole grains. Recommend discontinuing sugary beverages,  Using JXBJYN and Zepbound demo pens, educatde patient on mechansim of action, storage, site selection and administration. Patient voiced understanding. Will complete prior authorization for both medications and contact patient with result.  Start Wegovy/Zepbound q weekly F/u in 1 month if approved  Laural Golden, PharmD, BCACP, CDCES, CPP [address] [place], [place], [address] Phone: [phone], Fax: [phone]

## 2023-03-11 NOTE — Patient Instructions (Signed)
It was nice meeting you two today  The medications we discussed today are called Wegovy and Zepbound  I will complete the prior authorizations for each and contact you with the result  Concentrate on vegetables, lean proteins, healthy fats and watch your sugar intake  Please message or call with any questions  Laural Golden, PharmD, BCACP, CDCES, CPP [address], [address] [place], [place], [address] Phone: [phone], Fax: [phone]

## 2023-03-11 NOTE — Telephone Encounter (Signed)
PA for [facility] submitted.  Key: Lanny Hurst

## 2023-03-22 ENCOUNTER — Encounter (HOSPITAL_BASED_OUTPATIENT_CLINIC_OR_DEPARTMENT_OTHER): Payer: Self-pay | Admitting: Emergency Medicine

## 2023-03-22 ENCOUNTER — Other Ambulatory Visit: Payer: Self-pay

## 2023-03-22 ENCOUNTER — Emergency Department (HOSPITAL_BASED_OUTPATIENT_CLINIC_OR_DEPARTMENT_OTHER): Payer: BC Managed Care – PPO

## 2023-03-22 ENCOUNTER — Emergency Department (HOSPITAL_BASED_OUTPATIENT_CLINIC_OR_DEPARTMENT_OTHER)
Admission: EM | Admit: 2023-03-22 | Discharge: 2023-03-22 | Disposition: A | Discharge status: Home / Self Care | Payer: BC Managed Care – PPO | Attending: Emergency Medicine | Admitting: Emergency Medicine

## 2023-03-22 DIAGNOSIS — Z79899 Other long term (current) drug therapy: Secondary | ICD-10-CM | POA: Diagnosis not present

## 2023-03-22 DIAGNOSIS — Z7982 Long term (current) use of aspirin: Secondary | ICD-10-CM | POA: Diagnosis not present

## 2023-03-22 DIAGNOSIS — R1032 Left lower quadrant pain: Secondary | ICD-10-CM | POA: Diagnosis not present

## 2023-03-22 DIAGNOSIS — F17220 Nicotine dependence, chewing tobacco, uncomplicated: Secondary | ICD-10-CM | POA: Insufficient documentation

## 2023-03-22 DIAGNOSIS — N200 Calculus of kidney: Secondary | ICD-10-CM | POA: Insufficient documentation

## 2023-03-22 DIAGNOSIS — K449 Diaphragmatic hernia without obstruction or gangrene: Secondary | ICD-10-CM | POA: Diagnosis not present

## 2023-03-22 LAB — CBC
HCT: 45.9 % (ref 39.0–52.0)
Hemoglobin: 15.2 g/dL (ref 13.0–17.0)
MCH: 27.8 pg (ref 26.0–34.0)
MCHC: 33.1 g/dL (ref 30.0–36.0)
MCV: 83.9 fL (ref 80.0–100.0)
Platelets: 212 10*3/uL (ref 150–400)
RBC: 5.47 MIL/uL (ref 4.22–5.81)
RDW: 12.8 % (ref 11.5–15.5)
WBC: 6.9 10*3/uL (ref 4.0–10.5)
nRBC: 0 % (ref 0.0–0.2)

## 2023-03-22 LAB — URINALYSIS, ROUTINE W REFLEX MICROSCOPIC
Bilirubin Urine: NEGATIVE
Glucose, UA: NEGATIVE mg/dL
Hgb urine dipstick: NEGATIVE
Ketones, ur: NEGATIVE mg/dL
Leukocytes,Ua: NEGATIVE
Nitrite: NEGATIVE
Specific Gravity, Urine: 1.029 (ref 1.005–1.030)
pH: 5.5 (ref 5.0–8.0)

## 2023-03-22 LAB — BASIC METABOLIC PANEL
Anion gap: 7 (ref 5–15)
BUN: 11 mg/dL (ref 6–20)
CO2: 27 mmol/L (ref 22–32)
Calcium: 8.8 mg/dL — ABNORMAL LOW (ref 8.9–10.3)
Chloride: 103 mmol/L (ref 98–111)
Creatinine, Ser: 0.85 mg/dL (ref 0.61–1.24)
GFR, Estimated: >60 mL/min (ref >60)
Glucose, Bld: 114 mg/dL — ABNORMAL HIGH (ref 70–99)
Potassium: 4.1 mmol/L (ref 3.5–5.1)
Sodium: 137 mmol/L (ref 135–145)

## 2023-03-22 LAB — HEPATIC FUNCTION PANEL
ALT: 33 U/L (ref 0–44)
AST: 23 U/L (ref 15–41)
Albumin: 4.2 g/dL (ref 3.5–5.0)
Alkaline Phosphatase: 66 U/L (ref 38–126)
Bilirubin, Direct: 0.1 mg/dL (ref 0.0–0.2)
Indirect Bilirubin: 0.4 mg/dL (ref 0.3–0.9)
Total Bilirubin: 0.5 mg/dL (ref 0.3–1.2)
Total Protein: 7 g/dL (ref 6.5–8.1)

## 2023-03-22 LAB — LIPASE, BLOOD: Lipase: 31 U/L (ref 11–51)

## 2023-03-22 MED ORDER — OXYCODONE HCL 5 MG PO TABS: 5.0000 mg | ORAL_TABLET | Freq: Four times a day (QID) | ORAL | 0 refills | Status: DC | PRN

## 2023-03-22 MED ORDER — TAMSULOSIN HCL 0.4 MG PO CAPS: 0.4000 mg | ORAL_CAPSULE | Freq: Every day | ORAL | 0 refills | Status: AC

## 2023-03-22 MED ORDER — KETOROLAC TROMETHAMINE 15 MG/ML IJ SOLN
15.0000 mg | Freq: Once | INTRAMUSCULAR | Status: AC
  Administered 2023-03-22: 15 mg via INTRAVENOUS
  Filled 2023-03-22: qty 1

## 2023-03-22 MED ORDER — SODIUM CHLORIDE 0.9 % IV BOLUS
1000.0000 mL | Freq: Once | INTRAVENOUS | Status: AC
  Administered 2023-03-22: 1000 mL via INTRAVENOUS

## 2023-03-22 MED ORDER — ONDANSETRON HCL 4 MG PO TABS: 4.0000 mg | ORAL_TABLET | ORAL | 0 refills | Status: DC | PRN

## 2023-03-22 NOTE — Discharge Instructions (Signed)
It was a pleasure caring for you today in the emergency department..  Your workup today did show some small kidney stones that should pass spontaneously.  Recommend you stay hydrated the next few days, take pain medications as needed.  Follow-up with urology if symptoms persist  Please return to the emergency department for any worsening or worrisome symptoms.

## 2023-03-22 NOTE — ED Triage Notes (Signed)
Pt arrives to ED with c/o right sided flank pain and left sided groin pain. Pt notes he has been passing kidney stones over the past two days.

## 2023-03-22 NOTE — ED Provider Notes (Signed)
[place] EMERGENCY DEPARTMENT AT [facility] Provider Note  CSN: [id number] Arrival date & time: 03/22/23 4098  Chief Complaint(s) Flank Pain  HPI [name] is a 47 y.o. male with past medical history as below, significant for nephrolithiasis, HLD, obesity, angina who presents to the ED with complaint of flank pain, concern for kidney stone  Patient reports left flank, left lower quadrant abdominal pain over the past 2 days.  Feels he may have passed a couple kidney stones earlier this morning last night.  He had nausea and vomiting morning which is since subsided.  No fevers or chills.  Pain sharp, stabbing, intermittently radiating to his left flank/back.  Has some dysuria when passing kidney stone otherwise no other change in urination.  No gross hematuria.  No change in bowel function.  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 Authorizing Provider  ondansetron (ZOFRAN) 4 MG tablet Take 1 tablet (4 mg total) by mouth every 4 (four) hours as needed for nausea or vomiting. 03/22/23  Yes Tanda Rockers A, DO  oxyCODONE (ROXICODONE) 5 MG immediate release tablet Take 1 tablet (5 mg total) by mouth every 6 (six) hours as needed for severe pain. 03/22/23  Yes Tanda Rockers A, DO  tamsulosin (FLOMAX) 0.4 MG CAPS capsule Take 1 capsule (0.4 mg total) by mouth daily for 14 days. 03/22/23 04/05/23 Yes Sloan Leiter, DO  aspirin EC 81 MG tablet Take 1  tablet (81 mg total) by mouth daily. Swallow whole. 02/12/23   Arnetha Courser, MD  metoprolol succinate (TOPROL-XL) 25 MG 24 hr tablet Take 1 tablet (25 mg total) by mouth daily. 02/12/23   Arnetha Courser, MD  omeprazole (PRILOSEC) 40 MG capsule Take 1 capsule (40 mg total) by mouth daily. 10/22/22   Croitoru, Mihai, MD  rosuvastatin (CRESTOR) 40 MG tablet Take 1 tablet (40 mg total) by mouth daily. 02/12/23   Arnetha Courser, MD  ticagrelor (BRILINTA) 90 MG TABS tablet Take 1 tablet (90 mg total) by mouth 2 (two) times daily. 02/11/23   Arnetha Courser, MD                                                                                                                                    Past Surgical History Past Surgical History:  Procedure Laterality Date   APPENDECTOMY     CORONARY STENT INTERVENTION N/A  02/10/2023   Procedure: CORONARY STENT INTERVENTION;  Surgeon: Marykay Lex, MD;  Location: [facility] INVASIVE CV LAB;  Service: Cardiovascular;  Laterality: N/A;   LEFT HEART CATH AND CORONARY ANGIOGRAPHY N/A 02/10/2023   Procedure: LEFT HEART CATH AND CORONARY ANGIOGRAPHY;  Surgeon: Marykay Lex, MD;  Location: [facility] INVASIVE CV LAB;  Service: Cardiovascular;  Laterality: N/A;   TONSILLECTOMY     Family History Family History  Problem Relation Age of Onset   CAD Father     Social History Social History   Tobacco Use   Smoking status: Never   Smokeless tobacco: Current    Types: Chew  Substance Use Topics   Alcohol use: Yes    Comment: Seldom   Drug use: Not Currently   Allergies Patient has no known allergies.  Review of Systems Review of Systems  Constitutional:  Negative for chills and fever.  HENT:  Negative for facial swelling and trouble swallowing.   Eyes:  Negative for photophobia and visual disturbance.  Respiratory:  Negative for cough and shortness of breath.   Cardiovascular:  Negative for chest pain and palpitations.  Gastrointestinal:  Positive for abdominal pain,  nausea and vomiting.  Endocrine: Negative for polydipsia and polyuria.  Genitourinary:  Positive for dysuria and flank pain. Negative for difficulty urinating and hematuria.  Musculoskeletal:  Negative for gait problem and joint swelling.  Skin:  Negative for pallor and rash.  Neurological:  Negative for syncope and headaches.  Psychiatric/Behavioral:  Negative for agitation and confusion.     Physical Exam Vital Signs  I have reviewed the triage vital signs BP 100/62   Pulse 60   Temp 98 F (36.7 C) (Temporal)   Resp 18   Ht 6\' 1"  (1.854 m)   Wt (!) 157.9 kg   SpO2 100%   BMI 45.91 kg/m  Physical Exam Vitals and nursing note reviewed.  Constitutional:      General: He is not in acute distress.    Appearance: He is well-developed. He is obese.  HENT:     Head: Normocephalic and atraumatic.     Right Ear: External ear normal.     Left Ear: External ear normal.     Mouth/Throat:     Mouth: Mucous membranes are moist.  Eyes:     General: No scleral icterus. Cardiovascular:     Rate and Rhythm: Normal rate and regular rhythm.     Pulses: Normal pulses.     Heart sounds: Normal heart sounds.  Pulmonary:     Effort: Pulmonary effort is normal. No respiratory distress.     Breath sounds: Normal breath sounds.  Abdominal:     General: Abdomen is flat.     Palpations: Abdomen is soft.     Tenderness: There is abdominal tenderness. There is no guarding or rebound.    Musculoskeletal:     Cervical back: No rigidity.     Right lower leg: No edema.     Left lower leg: No edema.  Skin:    General: Skin is warm and dry.     Capillary Refill: Capillary refill takes less than 2 seconds.  Neurological:     Mental Status: He is alert.  Psychiatric:        Mood and Affect: Mood normal.        Behavior: Behavior normal.     ED Results and Treatments Labs (all labs ordered are listed, but only abnormal results are displayed) Labs Reviewed  URINALYSIS, ROUTINE W  REFLEX  MICROSCOPIC - Abnormal; Notable for the following components:      Result Value   Protein, ur TRACE (*)    All other components within normal limits  BASIC METABOLIC PANEL - Abnormal; Notable for the following components:   Glucose, Bld 114 (*)    Calcium 8.8 (*)    All other components within normal limits  CBC  HEPATIC FUNCTION PANEL  LIPASE, BLOOD                                                                                                                          Radiology CT Renal Stone Study  Result Date: 03/22/2023 CLINICAL DATA:  Right-sided flank pain and left-sided groin pain. Reports passing kidney stones over the past 2 days. EXAM: CT ABDOMEN AND PELVIS WITHOUT CONTRAST TECHNIQUE: Multidetector CT imaging of the abdomen and pelvis was performed following the standard protocol without IV contrast. RADIATION DOSE REDUCTION: This exam was performed according to the departmental dose-optimization program which includes automated exposure control, adjustment of the mA and/or kV according to patient size and/or use of iterative reconstruction technique. COMPARISON:  CT abdomen and pelvis dated 04/29/2022 FINDINGS: Lower chest: No focal consolidation or pulmonary nodule in the lung bases. No pleural effusion or pneumothorax demonstrated. Partially imaged heart size is normal. Coronary artery calcifications. Hepatobiliary: No focal hepatic lesions. No intra or extrahepatic biliary ductal dilation. Normal gallbladder. Pancreas: No focal lesions or main ductal dilation. Spleen: Normal in size without focal abnormality. Adrenals/Urinary Tract: No adrenal nodules. No suspicious renal mass by noncontrast technique or hydronephrosis. Punctate nonobstructing left renal stones. No focal bladder wall thickening. Stomach/Bowel: Small hiatal hernia. Normal appearance of the stomach. No evidence of bowel wall thickening, distention, or inflammatory changes. Normal appendix. Vascular/Lymphatic: Aortic  atherosclerosis. No enlarged abdominal or pelvic lymph nodes. Reproductive: Prostate is unremarkable. Other: No free fluid, fluid collection, or free air. Musculoskeletal: No acute or abnormal lytic or blastic osseous lesions. IMPRESSION: 1. Punctate nonobstructing left renal stones. No hydronephrosis. 2. Small hiatal hernia. 3.  Aortic Atherosclerosis (ICD10-I70.0). Electronically Signed   By: Agustin Cree M.D.   On: 03/22/2023 11:18    Pertinent labs & imaging results that were available during my care of the patient were reviewed by me and considered in my medical decision making (see MDM for details).  Medications Ordered in ED Medications  sodium chloride 0.9 % bolus 1,000 mL (0 mLs Intravenous Stopped 03/22/23 1039)  ketorolac (TORADOL) 15 MG/ML injection 15 mg (15 mg Intravenous Given 03/22/23 1009)  Procedures Procedures  (including critical care time)  Medical Decision Making / ED Course    Medical Decision Making:    SANTO HITT is a 47 y.o. male with past medical history as below, significant for nephrolithiasis, HLD, obesity, angina who presents to the ED with complaint of flank pain, concern for kidney stone. The complaint involves an extensive differential diagnosis and also carries with it a high risk of complications and morbidity.  Serious etiology was considered. Ddx includes but is not limited to: Differential diagnosis includes but is not exclusive to acute appendicitis, renal colic, testicular torsion, urinary tract infection, prostatitis,  diverticulitis, small bowel obstruction, colitis, abdominal aortic aneurysm, gastroenteritis, constipation etc.   Complete initial physical exam performed, notably the patient  was no acute distress, HDS, sitting upright, afebrile.    Reviewed and confirmed nursing documentation for past medical history,  family history, social history.  Vital signs reviewed.    Narrative: 47 year old male history of nephrolithiasis, obesity here with left-sided flank and abdominal pain, concern for kidney stone Abdomen is nonperitoneal.  He is well-appearing Check screening labs, give analgesics, check CT renal  Clinical Course as of 03/22/23 1239  Tue Mar 22, 2023  1134 CT renal with punctate nonobstructing kidney stones.  Renal function is stable and metabolic panel, he is not septic [SG]    Clinical Course User Index [SG] Tanda Rockers A, DO      Workup today is stable, he has punctate nonobstructing nephrolithiasis on imaging.  He is afebrile, he is tolerant p.o. without difficulty.  He is feeling much better on recheck.  This is the likely source of his discomfort.  He is not septic, not septic stone, no renal abnormalities, no evidence of obstructive nephrolithiasis.  Plan to discharge patient home with supportive care, follow-up with urology.  The patient improved significantly and was discharged in stable condition. Detailed discussions were had with the patient regarding current findings, and need for close f/u with PCP or on call doctor. The patient has been instructed to return immediately if the symptoms worsen in any way for re-evaluation. Patient verbalized understanding and is in agreement with current care plan. All questions answered prior to discharge.                 Additional history obtained: -Additional history obtained from family -External records from outside source obtained and reviewed including: Chart review including previous notes, labs, imaging, consultation notes including  Prior ED visits, home medications, prior admission   Lab Tests: -I ordered, reviewed, and interpreted labs.   The pertinent results include:   Labs Reviewed  URINALYSIS, ROUTINE W REFLEX MICROSCOPIC - Abnormal; Notable for the following components:      Result Value   Protein, ur  TRACE (*)    All other components within normal limits  BASIC METABOLIC PANEL - Abnormal; Notable for the following components:   Glucose, Bld 114 (*)    Calcium 8.8 (*)    All other components within normal limits  CBC  HEPATIC FUNCTION PANEL  LIPASE, BLOOD    Notable for labs stable  EKG   EKG Interpretation Date/Time:    Ventricular Rate:    PR Interval:    QRS Duration:    QT Interval:    QTC Calculation:   R Axis:      Text Interpretation:           Imaging Studies ordered: I ordered imaging studies including CT renal I independently visualized the following  imaging with scope of interpretation limited to determining acute life threatening conditions related to emergency care; findings noted above, significant for nonobstructing nephrolithiasis I independently visualized and interpreted imaging. I agree with the radiologist interpretation   Medicines ordered and prescription drug management: Meds ordered this encounter  Medications   sodium chloride 0.9 % bolus 1,000 mL   ketorolac (TORADOL) 15 MG/ML injection 15 mg   oxyCODONE (ROXICODONE) 5 MG immediate release tablet    Sig: Take 1 tablet (5 mg total) by mouth every 6 (six) hours as needed for severe pain.    Dispense:  10 tablet    Refill:  0   tamsulosin (FLOMAX) 0.4 MG CAPS capsule    Sig: Take 1 capsule (0.4 mg total) by mouth daily for 14 days.    Dispense:  14 capsule    Refill:  0   ondansetron (ZOFRAN) 4 MG tablet    Sig: Take 1 tablet (4 mg total) by mouth every 4 (four) hours as needed for nausea or vomiting.    Dispense:  8 tablet    Refill:  0    -I have reviewed the patients home medicines and have made adjustments as needed   Consultations Obtained: na   Cardiac Monitoring: Continuous pulse oximetry interpreted by myself, 99% on RA.    Social Determinants of Health:  Diagnosis or treatment significantly limited by social determinants of health: current smoker and  obesity Counseled patient for approximately 3 minutes regarding smoking cessation. Discussed risks of smoking and how they applied and affected their visit here today.   CPT code: 86578: intermediate counseling for smoking cessation     Reevaluation: After the interventions noted above, I reevaluated the patient and found that they have improved  Co morbidities that complicate the patient evaluation  Past Medical History:  Diagnosis Date   Kidney stones       Dispostion: Disposition decision including need for hospitalization was considered, and patient discharged from emergency department.    Final Clinical Impression(s) / ED Diagnoses Final diagnoses:  Nephrolithiasis        Sloan Leiter, DO 03/22/23 1239

## 2023-03-22 NOTE — ED Notes (Signed)
Discharge paperwork given and verbally understood. 

## 2023-03-22 NOTE — ED Notes (Signed)
Pt aware of the need for a urine... Unable to currently provide the sample... 

## 2023-03-24 DIAGNOSIS — R112 Nausea with vomiting, unspecified: Secondary | ICD-10-CM | POA: Diagnosis not present

## 2023-03-24 DIAGNOSIS — N200 Calculus of kidney: Secondary | ICD-10-CM | POA: Diagnosis not present

## 2023-03-24 DIAGNOSIS — Z20822 Contact with and (suspected) exposure to covid-19: Secondary | ICD-10-CM | POA: Diagnosis not present

## 2023-04-11 ENCOUNTER — Encounter: Payer: Self-pay | Admitting: Pharmacist

## 2023-04-11 DIAGNOSIS — M9905 Segmental and somatic dysfunction of pelvic region: Secondary | ICD-10-CM | POA: Diagnosis not present

## 2023-04-11 DIAGNOSIS — M545 Low back pain, unspecified: Secondary | ICD-10-CM | POA: Diagnosis not present

## 2023-04-11 DIAGNOSIS — M9903 Segmental and somatic dysfunction of lumbar region: Secondary | ICD-10-CM | POA: Diagnosis not present

## 2023-04-11 DIAGNOSIS — M9904 Segmental and somatic dysfunction of sacral region: Secondary | ICD-10-CM | POA: Diagnosis not present

## 2023-06-07 ENCOUNTER — Other Ambulatory Visit: Payer: Self-pay

## 2023-06-07 ENCOUNTER — Emergency Department (HOSPITAL_BASED_OUTPATIENT_CLINIC_OR_DEPARTMENT_OTHER): Payer: BC Managed Care – PPO

## 2023-06-07 ENCOUNTER — Emergency Department (HOSPITAL_BASED_OUTPATIENT_CLINIC_OR_DEPARTMENT_OTHER)
Admission: EM | Admit: 2023-06-07 | Discharge: 2023-06-07 | Disposition: A | Discharge status: Home / Self Care | Payer: BC Managed Care – PPO | Attending: Emergency Medicine | Admitting: Emergency Medicine

## 2023-06-07 ENCOUNTER — Encounter (HOSPITAL_BASED_OUTPATIENT_CLINIC_OR_DEPARTMENT_OTHER): Payer: Self-pay

## 2023-06-07 DIAGNOSIS — R002 Palpitations: Secondary | ICD-10-CM

## 2023-06-07 DIAGNOSIS — R42 Dizziness and giddiness: Secondary | ICD-10-CM | POA: Diagnosis not present

## 2023-06-07 DIAGNOSIS — I493 Ventricular premature depolarization: Secondary | ICD-10-CM | POA: Insufficient documentation

## 2023-06-07 DIAGNOSIS — R079 Chest pain, unspecified: Secondary | ICD-10-CM | POA: Diagnosis not present

## 2023-06-07 DIAGNOSIS — Z7982 Long term (current) use of aspirin: Secondary | ICD-10-CM | POA: Diagnosis not present

## 2023-06-07 DIAGNOSIS — Z1152 Encounter for screening for COVID-19: Secondary | ICD-10-CM | POA: Diagnosis not present

## 2023-06-07 LAB — TROPONIN I (HIGH SENSITIVITY)
Troponin I (High Sensitivity): 3 ng/L (ref <18)
Troponin I (High Sensitivity): 3 ng/L (ref <18)

## 2023-06-07 LAB — BASIC METABOLIC PANEL
Anion gap: 6 (ref 5–15)
BUN: 9 mg/dL (ref 6–20)
CO2: 26 mmol/L (ref 22–32)
Calcium: 8.7 mg/dL — ABNORMAL LOW (ref 8.9–10.3)
Chloride: 106 mmol/L (ref 98–111)
Creatinine, Ser: 0.79 mg/dL (ref 0.61–1.24)
GFR, Estimated: >60 mL/min (ref >60)
Glucose, Bld: 126 mg/dL — ABNORMAL HIGH (ref 70–99)
Potassium: 4.3 mmol/L (ref 3.5–5.1)
Sodium: 138 mmol/L (ref 135–145)

## 2023-06-07 LAB — CBC WITH DIFFERENTIAL/PLATELET
Abs Immature Granulocytes: 0.03 10*3/uL (ref 0.00–0.07)
Basophils Absolute: 0 10*3/uL (ref 0.0–0.1)
Basophils Relative: 0 %
Eosinophils Absolute: 0.1 10*3/uL (ref 0.0–0.5)
Eosinophils Relative: 2 %
HCT: 46 % (ref 39.0–52.0)
Hemoglobin: 15.3 g/dL (ref 13.0–17.0)
Immature Granulocytes: 1 %
Lymphocytes Relative: 25 %
Lymphs Abs: 1.6 10*3/uL (ref 0.7–4.0)
MCH: 28.2 pg (ref 26.0–34.0)
MCHC: 33.3 g/dL (ref 30.0–36.0)
MCV: 84.7 fL (ref 80.0–100.0)
Monocytes Absolute: 0.5 10*3/uL (ref 0.1–1.0)
Monocytes Relative: 8 %
Neutro Abs: 4.2 10*3/uL (ref 1.7–7.7)
Neutrophils Relative %: 64 %
Platelets: 198 10*3/uL (ref 150–400)
RBC: 5.43 MIL/uL (ref 4.22–5.81)
RDW: 13 % (ref 11.5–15.5)
WBC: 6.4 10*3/uL (ref 4.0–10.5)
nRBC: 0 % (ref 0.0–0.2)

## 2023-06-07 LAB — RESP PANEL BY RT-PCR (FLU A&B, COVID) ARPGX2
Influenza A by PCR: NEGATIVE
Influenza B by PCR: NEGATIVE
SARS Coronavirus 2 by RT PCR: NEGATIVE

## 2023-06-07 MED ORDER — ONDANSETRON 8 MG PO TBDP: 8.0000 mg | ORAL_TABLET | Freq: Three times a day (TID) | ORAL | 0 refills | Status: DC | PRN

## 2023-06-07 NOTE — ED Triage Notes (Signed)
In for eval of headache and fatigue yesterday. While getting ready for work he vomited x2, experienced dizziness, and was near syncopal. Cardiac stent placed summer 2024 and on blood thinners. Felt like his heart was racing.

## 2023-06-07 NOTE — ED Notes (Signed)
Dc instructions reviewed with patient. Patient voiced understanding. Dc with belongings.  °

## 2023-06-07 NOTE — ED Provider Notes (Signed)
[place] EMERGENCY DEPARTMENT AT [facility] Provider Note   CSN: [id number] Arrival date & time: 06/07/23  4098     History  Chief Complaint  Patient presents with   Palpitations    Grant [name] is a 47 y.o. male.  HPI    47 year old male comes in with chief complaint of palpitations, dizziness and nausea and vomiting.  Patient is accompanied by his wife.  He has history of hyperlipidemia, elevated BMI and CAD status post stent earlier this year.  According the patient, he noted yesterday that he had some headaches in the morning.  The headaches since resolved.  However he remained fatigued.  He woke up this morning and felt nauseous.  When he was getting ready for work, he had emesis x 2.  Thereafter when he was walking back to his room he started getting dizzy and felt like he was going to fall.  To me, patient indicated that he never felt like she was going to faint.  He however has experienced some palpitations.  Palpitations were severe this morning and on occasion he has had throbbing sensation over his chest.  He has not had any chest pain and no episodes similar to the ACS he experienced earlier.  Patient has been compliant with this medication.   According to the wife, patient was clammy appearing today.  Additionally patient felt dizzy and nauseous after intercourse yesterday.  Patient did not have any chest pain or shortness of breath while having intercourse.   Home Medications Prior to Admission medications   Medication Sig Start Date End Date Taking? Authorizing Provider  ondansetron (ZOFRAN-ODT) 8 MG disintegrating tablet Take 1 tablet (8 mg total) by mouth every 8 (eight) hours as needed for nausea. 06/07/23  Yes Derwood Kaplan, MD  aspirin EC 81 MG tablet Take 1 tablet (81 mg total) by mouth daily. Swallow whole. 02/12/23   Arnetha Courser, MD  metoprolol succinate (TOPROL-XL) 25 MG 24 hr tablet Take 1 tablet (25 mg total) by mouth daily. 02/12/23   Arnetha Courser, MD  omeprazole (PRILOSEC) 40 MG capsule Take 1 capsule (40 mg total) by mouth daily. 10/22/22   Croitoru, Mihai, MD  oxyCODONE (ROXICODONE) 5 MG immediate release tablet Take 1 tablet (5 mg total) by mouth every 6 (six) hours as needed for severe pain. 03/22/23   Sloan Leiter, DO  rosuvastatin (CRESTOR) 40 MG tablet Take 1 tablet (40 mg total) by mouth daily. 02/12/23   Arnetha Courser, MD  ticagrelor (BRILINTA) 90 MG TABS tablet Take 1 tablet (90 mg total) by mouth 2 (two) times daily. 02/11/23   Arnetha Courser, MD      Allergies    Patient has no known allergies.    Review of Systems   Review of Systems  All other systems reviewed and are negative.   Physical Exam Updated Vital Signs BP 119/77   Pulse 83   Temp 98.2 F (36.8 C) (Oral)   Resp (!) 22   Ht 6\' 1"  (1.854 m)   Wt (!) 156.5 kg   SpO2 95%   BMI 45.52 kg/m  Physical Exam Vitals and nursing note reviewed.  Constitutional:      Appearance: He is well-developed.  HENT:     Head: Atraumatic.  Eyes:     Extraocular Movements: Extraocular movements intact.     Pupils: Pupils are equal, round, and reactive to light.  Cardiovascular:     Rate and Rhythm: Normal rate.  Pulmonary:  Effort: Pulmonary effort is normal.  Musculoskeletal:     Cervical back: Neck supple.     Right lower leg: No edema.     Left lower leg: No edema.  Skin:    General: Skin is warm.  Neurological:     Mental Status: He is alert and oriented to person, place, and time.     Cranial Nerves: No cranial nerve deficit.     Sensory: No sensory deficit.     Motor: No weakness.     ED Results / Procedures / Treatments   Labs (all labs ordered are listed, but only abnormal results are displayed) Labs Reviewed  BASIC METABOLIC PANEL - Abnormal; Notable for the following components:      Result Value   Glucose, Bld 126 (*)    Calcium 8.7 (*)    All other components within normal limits  RESP PANEL BY RT-PCR (FLU A&B, COVID) [id number]   CBC WITH DIFFERENTIAL/PLATELET  TROPONIN I (HIGH SENSITIVITY)  TROPONIN I (HIGH SENSITIVITY)    EKG EKG Interpretation Date/Time:  Tuesday June 07 2023 08:09:51 EST Ventricular Rate:  77 PR Interval:  154 QRS Duration:  112 QT Interval:  397 QTC Calculation: 450 R Axis:   111  Text Interpretation: Sinus rhythm Borderline intraventricular conduction delay Minimal ST elevation, lateral leads No acute changes No significant change since last tracing Confirmed by Derwood Kaplan [phone]) on 06/07/2023 8:14:37 AM   EKG Interpretation Date/Time:  Tuesday June 07 2023 10:44:28 EST Ventricular Rate:  66 PR Interval:  149 QRS Duration:  117 QT Interval:  394 QTC Calculation: 413 R Axis:   110  Text Interpretation: Sinus rhythm Nonspecific intraventricular conduction delay Low voltage, precordial leads No significant change since last tracing Confirmed by Derwood Kaplan ([id number]) on 06/07/2023 11:05:19 AM        Radiology DG Chest Port 1 View  Result Date: 06/07/2023 CLINICAL DATA:  Chest pain. EXAM: PORTABLE CHEST 1 VIEW COMPARISON:  02/10/2023. FINDINGS: Bilateral lung fields are clear. Bilateral costophrenic angles are clear. Normal cardio-mediastinal silhouette. No acute osseous abnormalities. The soft tissues are within normal limits. IMPRESSION: *No active disease. Electronically Signed   By: Jules Schick M.D.   On: 06/07/2023 09:29    Procedures Procedures    Medications Ordered in ED Medications - No data to display  ED Course/ Medical Decision Making/ A&P                                 Medical Decision Making Amount and/or Complexity of Data Reviewed Labs: ordered. Radiology: ordered.  This patient presents to the ED with chief complaint(s) of nausea, vomiting, malaise and dizziness with pertinent past medical history of coronary artery disease status post PCI, elevated BMI and hyperlipidemia.patient has no previous history of strokes.  Patient also has  been feeling palpitations, no history of arrhythmia.The complaint involves an extensive differential diagnosis and also carries with it a high risk of complications and morbidity.    The differential diagnosis includes : Acute coronary syndrome, orthostatic dizziness, severe electrolyte abnormality, PVCs, PAC, A-fib, PSVT. Stroke/TIA also considered in the differential, but seems to be less likely.  The initial plan is to get basic labs.  We will place patient on cardiac telemonitoring and get delta troponin. Initial EKG is reassuring.  Additional history obtained: Additional history obtained from spouse Records reviewed previous admission documents  Independent labs interpretation:  The following labs were independently  interpreted: Patient's basic labs are normal.  CBC is normal, electrolytes are fine.  Troponin x 2 are normal.  Orthostatic vital signs are normal.  Independent visualization and interpretation of imaging: - I independently visualized the following imaging with scope of interpretation limited to determining acute life threatening conditions related to emergency care: X-ray of the chest, which revealed no evidence of pneumonia.  Treatment and Reassessment: Patient reassessed.  He feels fine.  His workup is normal.  Results of the ED workup discussed with him.  On cardiac telemetry monitoring, it appears that patient is having multiple PVCs.  Wondering if he is symptomatic from it.  Will give cardiology follow-up.  I have reviewed cardiac telemetry monitoring/cardiac strips on this patient given his palpitations.  No concerning dysrhythmia noted.      Final Clinical Impression(s) / ED Diagnoses Final diagnoses:  PVC (premature ventricular contraction)  Dizzinesses  Palpitations    Rx / DC Orders ED Discharge Orders          Ordered    Ambulatory referral to Cardiology       Comments: If you have not heard from the Cardiology office within the next 72 hours  please call [phone].   06/07/23 1220    ondansetron (ZOFRAN-ODT) 8 MG disintegrating tablet  Every 8 hours PRN        06/07/23 1222              Derwood Kaplan, MD 06/07/23 1224

## 2023-06-07 NOTE — Discharge Instructions (Addendum)
We saw you in the ER for palpitations, weakness. All the results in the ER are normal, labs and imaging. Cardiac enzymes are normal.  Electrolytes are normal.  No renal failure.  No signs of infection, COVID-19 test is negative.  Her telemetry monitoring did indicate that you are having extra beats.  It is possible that some of your symptoms could be related to that.  We recommend cardiology follow-up for further workup.  Please return to the ER if you have worsening chest pain, shortness of breath, pain radiating to your jaw, shoulder, or back, sweats or fainting. Otherwise see the Cardiologist or your primary care doctor as requested.

## 2023-06-19 ENCOUNTER — Other Ambulatory Visit: Payer: Self-pay

## 2023-06-19 ENCOUNTER — Inpatient Hospital Stay
Admission: EM | Admit: 2023-06-19 | Discharge: 2023-06-23 | DRG: 309 | Disposition: A | Discharge status: Home / Self Care | Payer: BC Managed Care – PPO | Attending: Student | Admitting: Student

## 2023-06-19 ENCOUNTER — Emergency Department: Payer: BC Managed Care – PPO

## 2023-06-19 DIAGNOSIS — Z7901 Long term (current) use of anticoagulants: Secondary | ICD-10-CM | POA: Diagnosis not present

## 2023-06-19 DIAGNOSIS — I959 Hypotension, unspecified: Secondary | ICD-10-CM | POA: Diagnosis not present

## 2023-06-19 DIAGNOSIS — Z7902 Long term (current) use of antithrombotics/antiplatelets: Secondary | ICD-10-CM | POA: Diagnosis not present

## 2023-06-19 DIAGNOSIS — Z79899 Other long term (current) drug therapy: Secondary | ICD-10-CM

## 2023-06-19 DIAGNOSIS — I2489 Other forms of acute ischemic heart disease: Secondary | ICD-10-CM | POA: Diagnosis not present

## 2023-06-19 DIAGNOSIS — E66813 Obesity, class 3: Secondary | ICD-10-CM | POA: Diagnosis present

## 2023-06-19 DIAGNOSIS — I4891 Unspecified atrial fibrillation: Secondary | ICD-10-CM | POA: Diagnosis not present

## 2023-06-19 DIAGNOSIS — Z87442 Personal history of urinary calculi: Secondary | ICD-10-CM | POA: Diagnosis not present

## 2023-06-19 DIAGNOSIS — I493 Ventricular premature depolarization: Secondary | ICD-10-CM | POA: Diagnosis not present

## 2023-06-19 DIAGNOSIS — Z6841 Body Mass Index (BMI) 40.0 and over, adult: Secondary | ICD-10-CM | POA: Diagnosis not present

## 2023-06-19 DIAGNOSIS — Z955 Presence of coronary angioplasty implant and graft: Secondary | ICD-10-CM

## 2023-06-19 DIAGNOSIS — Z9861 Coronary angioplasty status: Secondary | ICD-10-CM | POA: Diagnosis not present

## 2023-06-19 DIAGNOSIS — I2510 Atherosclerotic heart disease of native coronary artery without angina pectoris: Secondary | ICD-10-CM | POA: Diagnosis not present

## 2023-06-19 DIAGNOSIS — F17220 Nicotine dependence, chewing tobacco, uncomplicated: Secondary | ICD-10-CM | POA: Diagnosis not present

## 2023-06-19 DIAGNOSIS — R079 Chest pain, unspecified: Secondary | ICD-10-CM | POA: Diagnosis not present

## 2023-06-19 DIAGNOSIS — I361 Nonrheumatic tricuspid (valve) insufficiency: Secondary | ICD-10-CM | POA: Diagnosis not present

## 2023-06-19 DIAGNOSIS — Z8249 Family history of ischemic heart disease and other diseases of the circulatory system: Secondary | ICD-10-CM | POA: Diagnosis not present

## 2023-06-19 DIAGNOSIS — E785 Hyperlipidemia, unspecified: Secondary | ICD-10-CM | POA: Diagnosis present

## 2023-06-19 DIAGNOSIS — Z7982 Long term (current) use of aspirin: Secondary | ICD-10-CM

## 2023-06-19 DIAGNOSIS — R0602 Shortness of breath: Secondary | ICD-10-CM

## 2023-06-19 DIAGNOSIS — G4733 Obstructive sleep apnea (adult) (pediatric): Secondary | ICD-10-CM | POA: Diagnosis not present

## 2023-06-19 LAB — BASIC METABOLIC PANEL
Anion gap: 7 (ref 5–15)
BUN: 11 mg/dL (ref 6–20)
CO2: 25 mmol/L (ref 22–32)
Calcium: 8.9 mg/dL (ref 8.9–10.3)
Chloride: 105 mmol/L (ref 98–111)
Creatinine, Ser: 0.96 mg/dL (ref 0.61–1.24)
GFR, Estimated: >60 mL/min (ref >60)
Glucose, Bld: 113 mg/dL — ABNORMAL HIGH (ref 70–99)
Potassium: 4.3 mmol/L (ref 3.5–5.1)
Sodium: 137 mmol/L (ref 135–145)

## 2023-06-19 LAB — CBC
HCT: 46.4 % (ref 39.0–52.0)
Hemoglobin: 15.7 g/dL (ref 13.0–17.0)
MCH: 28.1 pg (ref 26.0–34.0)
MCHC: 33.8 g/dL (ref 30.0–36.0)
MCV: 83.2 fL (ref 80.0–100.0)
Platelets: 236 10*3/uL (ref 150–400)
RBC: 5.58 MIL/uL (ref 4.22–5.81)
RDW: 12.8 % (ref 11.5–15.5)
WBC: 7.9 10*3/uL (ref 4.0–10.5)
nRBC: 0 % (ref 0.0–0.2)

## 2023-06-19 LAB — TROPONIN I (HIGH SENSITIVITY)
Troponin I (High Sensitivity): 4 ng/L (ref <18)
Troponin I (High Sensitivity): 4 ng/L (ref <18)

## 2023-06-19 LAB — TSH: TSH: 1.527 u[IU]/mL (ref 0.350–4.500)

## 2023-06-19 MED ORDER — ACETAMINOPHEN 325 MG PO TABS: 650.0000 mg | ORAL_TABLET | ORAL | Status: DC | PRN

## 2023-06-19 MED ORDER — DILTIAZEM HCL-DEXTROSE 125-5 MG/125ML-% IV SOLN (PREMIX)
5.0000 mg/h | INTRAVENOUS | Status: DC
  Administered 2023-06-19: 5 mg/h via INTRAVENOUS
  Filled 2023-06-19: qty 125

## 2023-06-19 MED ORDER — ENOXAPARIN SODIUM 40 MG/0.4ML IJ SOSY
40.0000 mg | PREFILLED_SYRINGE | INTRAMUSCULAR | Status: DC
  Administered 2023-06-19: 40 mg via SUBCUTANEOUS
  Filled 2023-06-19: qty 0.4

## 2023-06-19 MED ORDER — ONDANSETRON HCL 4 MG/2ML IJ SOLN: 4.0000 mg | Freq: Four times a day (QID) | INTRAMUSCULAR | Status: DC | PRN

## 2023-06-19 MED ORDER — DILTIAZEM HCL 25 MG/5ML IV SOLN
5.0000 mg | Freq: Once | INTRAVENOUS | Status: AC
  Administered 2023-06-19: 5 mg via INTRAVENOUS
  Filled 2023-06-19: qty 5

## 2023-06-19 MED ORDER — TICAGRELOR 90 MG PO TABS
90.0000 mg | ORAL_TABLET | Freq: Two times a day (BID) | ORAL | Status: DC
  Administered 2023-06-19 – 2023-06-20 (×3): 90 mg via ORAL
  Filled 2023-06-19 (×3): qty 1

## 2023-06-19 MED ORDER — ROSUVASTATIN CALCIUM 10 MG PO TABS
40.0000 mg | ORAL_TABLET | Freq: Every day | ORAL | Status: DC
  Administered 2023-06-20 – 2023-06-23 (×4): 40 mg via ORAL
  Filled 2023-06-19 (×4): qty 4

## 2023-06-19 MED ORDER — ASPIRIN 81 MG PO TBEC
81.0000 mg | DELAYED_RELEASE_TABLET | Freq: Every day | ORAL | Status: DC
  Administered 2023-06-20: 81 mg via ORAL
  Filled 2023-06-19: qty 1

## 2023-06-19 MED ORDER — DILTIAZEM HCL 25 MG/5ML IV SOLN
10.0000 mg | Freq: Once | INTRAVENOUS | Status: AC
  Administered 2023-06-19: 10 mg via INTRAVENOUS
  Filled 2023-06-19: qty 5

## 2023-06-19 MED ORDER — DILTIAZEM HCL 60 MG PO TABS
30.0000 mg | ORAL_TABLET | Freq: Once | ORAL | Status: AC
  Administered 2023-06-19: 30 mg via ORAL
  Filled 2023-06-19: qty 1

## 2023-06-19 MED ORDER — PANTOPRAZOLE SODIUM 40 MG PO TBEC
40.0000 mg | DELAYED_RELEASE_TABLET | Freq: Every day | ORAL | Status: DC
  Administered 2023-06-20 – 2023-06-23 (×4): 40 mg via ORAL
  Filled 2023-06-19 (×4): qty 1

## 2023-06-19 NOTE — Assessment & Plan Note (Addendum)
Patient presenting with 3 days of shortness of breath, palpitations and chest discomfort with evidence of new onset atrial fibrillation with RVR.  CHA2DS2-VASc of 1 (vascular disease).  Echocardiogram in July did not show any atrial enlargement.  - Telemetry monitoring - Continue diltiazem infusion for goal heart rate below 100 - Give score of 1 only and already on DAPT, will defer full anti-coagulation pending cardiology input

## 2023-06-19 NOTE — ED Triage Notes (Signed)
Pt reports short of breath for 3 days and today he started with a constant dull chest pain to his left chest. Pt reports has had issues in the past and had a cardiac stent placed.

## 2023-06-19 NOTE — Assessment & Plan Note (Signed)
Patient has a history of morbid obesity with BMI of 45 with complications including early heart disease.

## 2023-06-19 NOTE — Assessment & Plan Note (Signed)
History of CAD s/p DES to mid RCA July 2024.  He remains on DAPT at this time.  Chest pain is likely due to atrial fibrillation with RVR.  Troponin is negative and EKG is reassuring.  - Continue home DAPT - Hold home metoprolol for now - Continue home rosuvastatin

## 2023-06-19 NOTE — ED Provider Notes (Signed)
[facility] Provider Note    Event Date/Time   First MD Initiated Contact with Patient 06/19/23 1543     (approximate)   History   Shortness of Breath and Chest Pain   HPI  Grant [name] is a 47 y.o. male with a history of hyperlipidemia, morbid obesity, kidney stones and CAD who presents with shortness of breath over the last 3 days, worse with exertion, persistent course, associated with intermittent palpitations especially when he lies on his left side.  The patient also with some chest discomfort described as pressure or mild pain.  He denies dizziness or lightheadedness.  He has noted that his heart rate has been elevated.  He denies any leg swelling.  I reviewed the past medical records.  The patient was admitted to the hospital service in July with chest pain and shortness of breath.  He had a cardiac catheterization at that time with balloon angioplasty.  He was started on aspirin and Brilinta.  He was seen at the [facility], ED on 11/12 with palpitations, dizziness, and malaise.  Workup was negative at that time.   Physical Exam   Triage Vital Signs: ED Triage Vitals  Encounter Vitals Group     BP 06/19/23 1209 119/66     Systolic BP Percentile --      Diastolic BP Percentile --      Pulse Rate 06/19/23 1209 75     Resp 06/19/23 1209 18     Temp 06/19/23 1209 98.1 F (36.7 C)     Temp Source 06/19/23 1209 Oral     SpO2 06/19/23 1209 96 %     Weight 06/19/23 1150 (!) 343 lb 14.7 oz (156 kg)     Height 06/19/23 1150 6\' 1"  (1.854 m)     Head Circumference --      Peak Flow --      Pain Score 06/19/23 1150 7     Pain Loc --      Pain Education --      Exclude from Growth Chart --     Most recent vital signs: Vitals:   06/19/23 1700 06/19/23 1730  BP: 95/75 101/71  Pulse: 80 (!) 101  Resp: (!) 21 19  Temp:    SpO2: 98% 99%     General: Alert, well-appearing, no distress.  CV:  Good peripheral perfusion.  Tachycardic, irregular  rhythm. Resp:  Normal effort.  Lungs CTAB. Abd:  No distention.  Other:  No peripheral edema.   ED Results / Procedures / Treatments   Labs (all labs ordered are listed, but only abnormal results are displayed) Labs Reviewed  BASIC METABOLIC PANEL - Abnormal; Notable for the following components:      Result Value   Glucose, Bld 113 (*)    All other components within normal limits  CBC  TROPONIN I (HIGH SENSITIVITY)  TROPONIN I (HIGH SENSITIVITY)     EKG  ED ECG REPORT I, Dionne Bucy, the attending physician, personally viewed and interpreted this ECG.  Date: 06/19/2023 EKG Time: 1203 Rate: 125 Rhythm: Atrial fibrillation with RVR QRS Axis: Left axis Intervals: normal ST/T Wave abnormalities: normal Narrative Interpretation: no evidence of acute ischemia    RADIOLOGY  Chest x-ray: I independently viewed and interpreted the images; there is no focal consolidation or edema   PROCEDURES:  Critical Care performed: Yes, see critical care procedure note(s)  .Critical Care  Performed by: Dionne Bucy, MD Authorized by: Dionne Bucy, MD  Critical care provider statement:    Critical care time (minutes):  30   Critical care time was exclusive of:  Separately billable procedures and treating other patients   Critical care was necessary to treat or prevent imminent or life-threatening deterioration of the following conditions:  Cardiac failure   Critical care was time spent personally by me on the following activities:  Development of treatment plan with patient or surrogate, discussions with consultants, evaluation of patient's response to treatment, examination of patient, ordering and review of laboratory studies, ordering and review of radiographic studies, ordering and performing treatments and interventions, pulse oximetry, re-evaluation of patient's condition, review of old charts and obtaining history from patient or surrogate   Care discussed  with: admitting provider      MEDICATIONS ORDERED IN ED: Medications  diltiazem (CARDIZEM) 125 mg in dextrose 5% 125 mL (1 mg/mL) infusion (5 mg/hr Intravenous New Bag/Given 06/19/23 1843)  diltiazem (CARDIZEM) injection 5 mg (5 mg Intravenous Given 06/19/23 1620)  diltiazem (CARDIZEM) injection 10 mg (10 mg Intravenous Given 06/19/23 1639)  diltiazem (CARDIZEM) tablet 30 mg (30 mg Oral Given 06/19/23 1728)     IMPRESSION / MDM / ASSESSMENT AND PLAN / ED COURSE  I reviewed the triage vital signs and the nursing notes.  47 year old male with PMH as noted above presents with shortness of breath with exertion, chest discomfort, palpitations over the last several days.  On exam he is well-appearing.  He is tachycardic with an irregular rhythm.  Blood pressure is normal.  EKG shows new onset atrial fibrillation.  Differential diagnosis includes, but is not limited to, new onset atrial fibrillation, other dysrhythmia, ACS, new onset CHF, dehydration, electrolyte abnormality, other metabolic disturbance.  We will give Cardizem for rate control, obtain lab workup, chest x-ray, and reassess.  Patient's presentation is most consistent with acute presentation with potential threat to life or bodily function.  The patient is on the cardiac monitor to evaluate for evidence of arrhythmia and/or significant heart rate changes.  ----------------------------------------- 6:49 PM on 06/19/2023 -----------------------------------------  Chest x-ray shows no evidence of fluid overload.  Lab workup is unremarkable.  CBC shows no leukocytosis or anemia.  Troponins are negative.  The patient's heart rate responded to 10 mg of Cardizem so I followed this with a p.o. dose, however he is now once again tachycardic with a rate between the 120s and 140s.  He will need a Cardizem infusion for rate control.  I consulted Dr. Huel Cote from the hospitalist service; based on our discussion he agrees to evaluate the  patient for admission.   FINAL CLINICAL IMPRESSION(S) / ED DIAGNOSES   Final diagnoses:  Atrial fibrillation with RVR (HCC)     Rx / DC Orders   ED Discharge Orders     None        Note:  This document was prepared using Dragon voice recognition software and may include unintentional dictation errors.    Dionne Bucy, MD 06/19/23 1850

## 2023-06-19 NOTE — H&P (Signed)
History and Physical    Patient: Grant [name] ZOX:[id number] DOB: 23-Aug-1975 DOA: 06/19/2023 DOS: the patient was seen and examined on 06/19/2023 PCP: Patient, No Pcp Per  Patient coming from: Home  Chief Complaint:  Chief Complaint  Patient presents with   Shortness of Breath   Chest Pain   HPI: Grant [name] is a 47 y.o. male with medical history significant of CAD s/p PCI with DES to mid RCA (July 2024), morbid obesity with BMI of 45, hyperlipidemia, OSA, nephrolithiasis, who presents to the ED due to chest pain and shortness of breath.  Grant [name] states that yesterday morning, he began to experience gradually worsening shortness of breath and palpitations.  He had a short episode of very mild chest pain but that has gone and has not reoccurred.  He denies any new medications, recent illnesses.  He denies any nausea, vomiting, diarrhea, cough.  He denies any daily alcohol use or excessive caffeine use.  He has been compliant with all his home medications, and has taken them all today already.  Per chart review, patient went to the ED on 11/12 for dizziness with nausea and vomiting.  EKG and telemetry at that time demonstrated sinus rhythm with multiple PVCs.  ED course: On arrival to the ED, patient was normotensive at 119/66 with heart rate of 116.  He was saturating at 100% on room air.  He was afebrile at 98.9.  Initial workup notable for normal CBC, BMP with exception of glucose of 113, and troponin negative x 2.  Chest x-ray with no active disease.  EKG obtained with new onset atrial fibrillation with RVR.  Heart rate was not able to be controlled with bolus of diltiazem.  He was started on diltiazem infusion and [facility] contacted for admission.  Review of Systems: As mentioned in the history of present illness. All other systems reviewed and are negative.  Past Medical History:  Diagnosis Date   Kidney stones    Past Surgical History:  Procedure Laterality Date   APPENDECTOMY      CORONARY STENT INTERVENTION N/A 02/10/2023   Procedure: CORONARY STENT INTERVENTION;  Surgeon: Marykay Lex, MD;  Location: [facility] INVASIVE CV LAB;  Service: Cardiovascular;  Laterality: N/A;   LEFT HEART CATH AND CORONARY ANGIOGRAPHY N/A 02/10/2023   Procedure: LEFT HEART CATH AND CORONARY ANGIOGRAPHY;  Surgeon: Marykay Lex, MD;  Location: [facility] INVASIVE CV LAB;  Service: Cardiovascular;  Laterality: N/A;   TONSILLECTOMY     Social History:  reports that he has never smoked. His smokeless tobacco use includes chew. He reports current alcohol use. He reports that he does not currently use drugs.  No Known Allergies  Family History  Problem Relation Age of Onset   CAD Father     Prior to Admission medications   Medication Sig Start Date End Date Taking? Authorizing Provider  aspirin EC 81 MG tablet Take 1 tablet (81 mg total) by mouth daily. Swallow whole. 02/12/23   Arnetha Courser, MD  metoprolol succinate (TOPROL-XL) 25 MG 24 hr tablet Take 1 tablet (25 mg total) by mouth daily. 02/12/23   Arnetha Courser, MD  omeprazole (PRILOSEC) 40 MG capsule Take 1 capsule (40 mg total) by mouth daily. 10/22/22   Croitoru, Mihai, MD  ondansetron (ZOFRAN-ODT) 8 MG disintegrating tablet Take 1 tablet (8 mg total) by mouth every 8 (eight) hours as needed for nausea. 06/07/23   Derwood Kaplan, MD  oxyCODONE (ROXICODONE) 5 MG immediate release tablet Take 1 tablet (  5 mg total) by mouth every 6 (six) hours as needed for severe pain. 03/22/23   Sloan Leiter, DO  rosuvastatin (CRESTOR) 40 MG tablet Take 1 tablet (40 mg total) by mouth daily. 02/12/23   Arnetha Courser, MD  ticagrelor (BRILINTA) 90 MG TABS tablet Take 1 tablet (90 mg total) by mouth 2 (two) times daily. 02/11/23   Arnetha Courser, MD    Physical Exam: Vitals:   06/19/23 1730 06/19/23 1845 06/19/23 1900 06/19/23 1915  BP: 101/71 110/74 126/66   Pulse: (!) 101 (!) 51 62 86  Resp: 19 14 (!) 21 13  Temp:      TempSrc:      SpO2: 99% 99% 96%  97%  Weight:      Height:       Physical Exam Vitals and nursing note reviewed.  Constitutional:      General: He is not in acute distress.    Appearance: He is obese. He is not diaphoretic.  HENT:     Head: Normocephalic and atraumatic.  Cardiovascular:     Rate and Rhythm: Tachycardia present. Rhythm irregular.     Heart sounds: No murmur heard. Pulmonary:     Effort: Pulmonary effort is normal. No tachypnea or respiratory distress.     Breath sounds: Normal breath sounds.  Musculoskeletal:     Right lower leg: No edema.     Left lower leg: Edema (Trace pitting edema, patient states chronic since multiple surgeries) present.  Skin:    General: Skin is warm and dry.  Neurological:     General: No focal deficit present.     Mental Status: He is alert and oriented to person, place, and time.  Psychiatric:        Mood and Affect: Mood normal.        Behavior: Behavior normal.    Data Reviewed: CBC with WBC of 7.9, hemoglobin of 15.7, platelets of 236 CMP with sodium of 137, potassium 4.3, bicarb 25, glucose 113, BUN 11, creatinine 0.96 with GFR above 60 Troponin 4 and then 4  EKG personally reviewed.  Narrow complex irregular rhythm consistent with atrial fibrillation, although there are intermittent P waves.  Rate of 125.  No ischemic appearing ST or T wave changes.  DG Chest 2 View  Result Date: 06/19/2023 CLINICAL DATA:  cp/sob EXAM: CHEST - 2 VIEW COMPARISON:  June 07, 2023 FINDINGS: The cardiomediastinal silhouette is unchanged in contour. No pleural effusion. No pneumothorax. No acute pleuroparenchymal abnormality. Visualized abdomen is unremarkable. Multilevel degenerative changes of the thoracic spine. IMPRESSION: No acute cardiopulmonary abnormality. Electronically Signed   By: Meda Klinefelter M.D.   On: 06/19/2023 12:51    Results are pending, will review when available.  Assessment and Plan:  * Atrial fibrillation with RVR (HCC) Patient presenting with 3  days of shortness of breath, palpitations and chest discomfort with evidence of new onset atrial fibrillation with RVR.  CHA2DS2-VASc of 1 (vascular disease).  Echocardiogram in July did not show any atrial enlargement.  - Telemetry monitoring - Continue diltiazem infusion for goal heart rate below 100 - Give score of 1 only and already on DAPT, will defer full anti-coagulation pending cardiology input  CAD S/P percutaneous coronary angioplasty History of CAD s/p DES to mid RCA July 2024.  He remains on DAPT at this time.  Chest pain is likely due to atrial fibrillation with RVR.  Troponin is negative and EKG is reassuring.  - Continue home DAPT - Hold home  metoprolol for now - Continue home rosuvastatin  OSA (obstructive sleep apnea) Per cardiology notes, patient has a strong history of snoring with morbid obesity; STOP-BANG of 7.  High clinical suspicion for OSA, however no sleep study to date.  Uncontrolled OSA may certainly have been treated to the onset of atrial fibrillation.  - Will need outpatient sleep study - VBG in the a.m. - Monitor pulse oximetry at night  Obesity, Class III, BMI 40-49.9 (morbid obesity) (HCC) Patient has a history of morbid obesity with BMI of 45 with complications including early heart disease.  Advance Care Planning:   Code Status: Full Code   Consults: None  Family Communication: No family at bedside  Severity of Illness: The appropriate patient status for this patient is OBSERVATION. Observation status is judged to be reasonable and necessary in order to provide the required intensity of service to ensure the patient's safety. The patient's presenting symptoms, physical exam findings, and initial radiographic and laboratory data in the context of their medical condition is felt to place them at decreased risk for further clinical deterioration. Furthermore, it is anticipated that the patient will be medically stable for discharge from the hospital  within 2 midnights of admission.   Author: Verdene Lennert, MD 06/19/2023 7:34 PM  For on call review [email or web address].

## 2023-06-19 NOTE — Assessment & Plan Note (Signed)
Per cardiology notes, patient has a strong history of snoring with morbid obesity; STOP-BANG of 7.  High clinical suspicion for OSA, however no sleep study to date.  Uncontrolled OSA may certainly have been treated to the onset of atrial fibrillation.  - Will need outpatient sleep study - VBG in the a.m. - Monitor pulse oximetry at night

## 2023-06-20 ENCOUNTER — Telehealth (HOSPITAL_COMMUNITY): Payer: Self-pay | Admitting: Pharmacy Technician

## 2023-06-20 ENCOUNTER — Other Ambulatory Visit (HOSPITAL_COMMUNITY): Payer: Self-pay

## 2023-06-20 DIAGNOSIS — Z79899 Other long term (current) drug therapy: Secondary | ICD-10-CM | POA: Diagnosis not present

## 2023-06-20 DIAGNOSIS — F17220 Nicotine dependence, chewing tobacco, uncomplicated: Secondary | ICD-10-CM | POA: Diagnosis present

## 2023-06-20 DIAGNOSIS — E66813 Obesity, class 3: Secondary | ICD-10-CM | POA: Diagnosis present

## 2023-06-20 DIAGNOSIS — Z7902 Long term (current) use of antithrombotics/antiplatelets: Secondary | ICD-10-CM | POA: Diagnosis not present

## 2023-06-20 DIAGNOSIS — I2489 Other forms of acute ischemic heart disease: Secondary | ICD-10-CM | POA: Diagnosis present

## 2023-06-20 DIAGNOSIS — I493 Ventricular premature depolarization: Secondary | ICD-10-CM | POA: Diagnosis present

## 2023-06-20 DIAGNOSIS — Z9861 Coronary angioplasty status: Secondary | ICD-10-CM | POA: Diagnosis not present

## 2023-06-20 DIAGNOSIS — E785 Hyperlipidemia, unspecified: Secondary | ICD-10-CM | POA: Diagnosis present

## 2023-06-20 DIAGNOSIS — I361 Nonrheumatic tricuspid (valve) insufficiency: Secondary | ICD-10-CM | POA: Diagnosis not present

## 2023-06-20 DIAGNOSIS — G4733 Obstructive sleep apnea (adult) (pediatric): Secondary | ICD-10-CM | POA: Diagnosis not present

## 2023-06-20 DIAGNOSIS — I4891 Unspecified atrial fibrillation: Secondary | ICD-10-CM | POA: Diagnosis not present

## 2023-06-20 DIAGNOSIS — Z7901 Long term (current) use of anticoagulants: Secondary | ICD-10-CM | POA: Diagnosis not present

## 2023-06-20 DIAGNOSIS — Z7982 Long term (current) use of aspirin: Secondary | ICD-10-CM | POA: Diagnosis not present

## 2023-06-20 DIAGNOSIS — I2510 Atherosclerotic heart disease of native coronary artery without angina pectoris: Secondary | ICD-10-CM | POA: Diagnosis not present

## 2023-06-20 DIAGNOSIS — I959 Hypotension, unspecified: Secondary | ICD-10-CM | POA: Diagnosis not present

## 2023-06-20 DIAGNOSIS — Z8249 Family history of ischemic heart disease and other diseases of the circulatory system: Secondary | ICD-10-CM | POA: Diagnosis not present

## 2023-06-20 DIAGNOSIS — Z955 Presence of coronary angioplasty implant and graft: Secondary | ICD-10-CM | POA: Diagnosis not present

## 2023-06-20 DIAGNOSIS — Z87442 Personal history of urinary calculi: Secondary | ICD-10-CM | POA: Diagnosis not present

## 2023-06-20 DIAGNOSIS — Z6841 Body Mass Index (BMI) 40.0 and over, adult: Secondary | ICD-10-CM | POA: Diagnosis not present

## 2023-06-20 LAB — PROTIME-INR
INR: 1.1 (ref 0.8–1.2)
Prothrombin Time: 13.9 s (ref 11.4–15.2)

## 2023-06-20 LAB — BASIC METABOLIC PANEL
Anion gap: 7 (ref 5–15)
BUN: 11 mg/dL (ref 6–20)
CO2: 24 mmol/L (ref 22–32)
Calcium: 8 mg/dL — ABNORMAL LOW (ref 8.9–10.3)
Chloride: 104 mmol/L (ref 98–111)
Creatinine, Ser: 0.91 mg/dL (ref 0.61–1.24)
GFR, Estimated: >60 mL/min (ref >60)
Glucose, Bld: 120 mg/dL — ABNORMAL HIGH (ref 70–99)
Potassium: 3.6 mmol/L (ref 3.5–5.1)
Sodium: 135 mmol/L (ref 135–145)

## 2023-06-20 LAB — APTT: aPTT: 25 s (ref 24–36)

## 2023-06-20 LAB — HEPARIN LEVEL (UNFRACTIONATED): Heparin Unfractionated: 0.21 [IU]/mL — ABNORMAL LOW (ref 0.30–0.70)

## 2023-06-20 LAB — BLOOD GAS, VENOUS
Acid-Base Excess: 2.3 mmol/L — ABNORMAL HIGH (ref 0.0–2.0)
Bicarbonate: 26.5 mmol/L (ref 20.0–28.0)
O2 Saturation: 92.2 %
Patient temperature: 37
pCO2, Ven: 39 mm[Hg] — ABNORMAL LOW (ref 44–60)
pH, Ven: 7.44 — ABNORMAL HIGH (ref 7.25–7.43)
pO2, Ven: 60 mm[Hg] — ABNORMAL HIGH (ref 32–45)

## 2023-06-20 LAB — MAGNESIUM: Magnesium: 2.1 mg/dL (ref 1.7–2.4)

## 2023-06-20 MED ORDER — DIGOXIN 250 MCG PO TABS
0.2500 mg | ORAL_TABLET | Freq: Every day | ORAL | Status: DC
  Administered 2023-06-21 – 2023-06-22 (×2): 0.25 mg via ORAL
  Filled 2023-06-20 (×3): qty 1

## 2023-06-20 MED ORDER — HEPARIN BOLUS VIA INFUSION
5500.0000 [IU] | Freq: Once | INTRAVENOUS | Status: AC
  Administered 2023-06-20: 5500 [IU] via INTRAVENOUS
  Filled 2023-06-20: qty 5500

## 2023-06-20 MED ORDER — HEPARIN BOLUS VIA INFUSION
1750.0000 [IU] | Freq: Once | INTRAVENOUS | Status: AC
Start: 2023-06-20 — End: 2023-06-20
  Administered 2023-06-20: 1750 [IU] via INTRAVENOUS
  Filled 2023-06-20: qty 1750

## 2023-06-20 MED ORDER — SODIUM CHLORIDE 0.9 % IV BOLUS
500.0000 mL | Freq: Once | INTRAVENOUS | Status: AC
  Administered 2023-06-20: 500 mL via INTRAVENOUS

## 2023-06-20 MED ORDER — METOPROLOL TARTRATE 25 MG PO TABS
25.0000 mg | ORAL_TABLET | Freq: Four times a day (QID) | ORAL | Status: DC
  Administered 2023-06-20 (×3): 25 mg via ORAL
  Filled 2023-06-20 (×3): qty 1

## 2023-06-20 MED ORDER — DIGOXIN 0.25 MG/ML IJ SOLN
0.2500 mg | INTRAMUSCULAR | Status: AC
  Administered 2023-06-20 (×2): 0.25 mg via INTRAVENOUS
  Filled 2023-06-20 (×2): qty 2

## 2023-06-20 MED ORDER — HEPARIN (PORCINE) 25000 UT/250ML-% IV SOLN
1850.0000 [IU]/h | INTRAVENOUS | Status: DC
  Administered 2023-06-20: 1850 [IU]/h via INTRAVENOUS
  Administered 2023-06-20: 1650 [IU]/h via INTRAVENOUS
  Filled 2023-06-20 (×2): qty 250

## 2023-06-20 MED ORDER — METOPROLOL TARTRATE 25 MG PO TABS
25.0000 mg | ORAL_TABLET | Freq: Two times a day (BID) | ORAL | Status: DC
  Administered 2023-06-20: 25 mg via ORAL
  Filled 2023-06-20: qty 1

## 2023-06-20 NOTE — Telephone Encounter (Signed)
Patient Product/process development scientist completed.    The patient is insured through [facility]. Patient has [employer], may use a copay card, and/or apply for patient assistance if available.    Ran test claim for Eliquis 5 mg and the current 30 day co-pay is $0.00.   This test claim was processed through [facility]- copay amounts may vary at other pharmacies due to pharmacy/plan contracts, or as the patient moves through the different stages of their insurance plan.     Roland Earl, CPHT Pharmacy Technician III Certified Patient [facility] Patient Advocate Team Direct Number: [phone]  Fax: [phone]

## 2023-06-20 NOTE — TOC Progression Note (Signed)
Transition of Care [facility] - Progression Note    Patient Details  Name: Grant [name] MRN: [id number] Date of Birth: 1975/10/10  Transition of Care [facility]) CM/SW Contact  Truddie Hidden, RN Phone Number: 06/20/2023, 3:28 PM  Clinical Narrative:    TOC continues to follow for discharge needs.          Expected Discharge Plan and Services                                               Social Determinants of Health (SDOH) Interventions SDOH Screenings   Food Insecurity: No Food Insecurity (06/19/2023)  Housing: Low Risk  (06/19/2023)  Transportation Needs: No Transportation Needs (06/19/2023)  Utilities: Not At Risk (06/19/2023)  Social Connections: Unknown (12/05/2021)   Received from [facility], [facility]  Tobacco Use: High Risk (06/19/2023)    Readmission Risk Interventions     No data to display

## 2023-06-20 NOTE — Consult Note (Addendum)
PHARMACY - ANTICOAGULATION CONSULT NOTE  Pharmacy Consult for Heparin Indication: atrial fibrillation  No Known Allergies  Patient Measurements: Height: 6\' 1"  (185.4 cm) Weight: (!) 156.5 kg (345 lb) IBW/kg (Calculated) : 79.9 Heparin Dosing Weight: 116.9 kg  Vital Signs: Temp: 98.4 F (36.9 C) (11/25 0810) Temp Source: Oral (11/25 0810) BP: 116/61 (11/25 1009) Pulse Rate: 110 (11/25 1009)  Labs: Recent Labs    06/19/23 1211 06/19/23 1544 06/20/23 0443  HGB 15.7  --   --   HCT 46.4  --   --   PLT 236  --   --   CREATININE 0.96  --  0.91  TROPONINIHS 4 4  --     Estimated Creatinine Clearance: 156.8 mL/min (by C-G formula based on SCr of 0.91 mg/dL).   Medical History: Past Medical History:  Diagnosis Date   Kidney stones    Pertinent Medications Enoxaparin last dose 11/24 @ 1946 Brilinta 90 mg BID New onset afib - not PTA anticoagulation   Baseline: NA, ordered   Assessment: Hazle Quant is a 47 yo male who presented to the ED 11/24 with SOB and chest pain. They were found to be in rapid afib - new onset. CHADSVASC of 1 (PCI). No formal diagnosis of HTN or diabetes per chart. No A1c in chart and not on any anti-hypertensive medications.   Goal of Therapy:  Heparin level 0.3-0.7 units/ml Monitor platelets by anticoagulation protocol: Yes   Plan:  Give 5,500 units bolus x 1 Start heparin infusion at 1650 units/hr Check anti-Xa level in 6 hours and daily while on heparin Continue to monitor H&H and platelets  Effie Shy, PharmD Pharmacy Resident  06/20/2023 10:57 AM

## 2023-06-20 NOTE — Progress Notes (Signed)
Sent secure chat to Dr. Kirke Corin to make him aware that when patient has any activity like standing to void his heart rate gets as high as 190 but mostly 160s and at rest appears to stay 110-130. MD acknowledged and stated that he placed orders for digoxin.

## 2023-06-20 NOTE — Progress Notes (Signed)
[facility] Progress Note  Patient: Grant [name]    WUJ:[id number]  DOA: 06/19/2023     Date of Service: the patient was seen and examined on 06/20/2023  Chief Complaint  Patient presents with   Shortness of Breath   Chest Pain   Brief hospital course: [name] is a 47 y.o. male with medical history significant of CAD s/p PCI with DES to mid RCA (July 2024), morbid obesity with BMI of 45, hyperlipidemia, OSA, nephrolithiasis, who presents to the ED due to chest pain and shortness of breath which started in the morning on 11/23 and gradually getting worse and felt palpitations.  Mild chest pain very short episode which resolved and did not recur.  Not any other complaints. Per chart review, patient went to the ED on 11/12 for dizziness with nausea and vomiting. EKG and telemetry at that time demonstrated sinus rhythm with multiple PVCs   ED Workup: BUN 19/66, HR 116, 100% on RA, afebrile. normal CBC, BMP with exception of glucose of 113, and troponin negative x 2. Chest x-ray with no active disease. EKG obtained with new onset atrial fibrillation with RVR. Heart rate was not able to be controlled with bolus of diltiazem. He was started on diltiazem infusion and [facility] contacted for admission.  Cardiology consulted, further management as below.  Assessment and Plan:  # A-fib with RVR Continue to monitor on telemetry CHA2DS2-VASc score 1 S/p Cardizem IV and started Cardizem IV infusion Started heparin IV infusion as per cardiology Started Lopressor 25 mg p.o. every 6 hourly due to soft blood pressure Started digoxin 0.25 mg IV every 2 hourly x 2 doses followed by 0.25 mg p.o. daily As per cardiology patient may need TEE and cardioversion   # CAD s/p stent Continue aspirin and Brilinta for now As per cardiology if patient will need DOAC then we need to stop aspirin and continue Brilinta Chest pain resolved most likely due to demand ischemia, troponin negative  OSA, follow  with PCP for sleep study as an outpatient Morbid obesity Body mass index is 45.52 kg/m.  Interventions: Calorie restricted diet and daily exercise advised to lose body weight.  Lifestyle modification discussed.  Diet: Heart healthy diet DVT Prophylaxis: Therapeutic Anticoagulation with heparin IV infusion    Advance goals of care discussion: Full code  Family Communication: family was present at bedside, at the time of interview.  The pt provided permission to discuss medical plan with the family. Opportunity was given to ask question and all questions were answered satisfactorily.   Disposition:  Pt is from home, admitted with A-fib with RVR, still has A-fib, on IV Cardizem infusion and IV heparin infusion, which precludes a safe discharge. Discharge to home, when stable, need cardiology clearance. Patient may need TEE and cardioversion  Subjective: No significant events overnight, currently patient is resting comfortably, no chest pain or palpitation, no shortness of breath, no any other active issues.  Physical Exam: General: NAD, lying comfortably Appear in no distress, affect appropriate Eyes: PERRLA ENT: Oral Mucosa Clear, moist  Neck: no JVD,  Cardiovascular: irregular rhythm, no Murmur,  Respiratory: good respiratory effort, Bilateral Air entry equal and Decreased, no Crackles, no wheezes Abdomen: Bowel Sound present, Soft and no tenderness,  Skin: no rashes Extremities: no Pedal edema, no calf tenderness Neurologic: without any new focal findings Gait not checked due to patient safety concerns  Vitals:   06/20/23 0730 06/20/23 0810 06/20/23 1009 06/20/23 1201  BP: 104/68 116/61 116/61 104/69  Pulse:  80 (!) 110 (!) 111  Resp: 17 16  18   Temp:  98.4 F (36.9 C)  98.1 F (36.7 C)  TempSrc:  Oral  Oral  SpO2: 98% 98%  93%  Weight:      Height:        Intake/Output Summary (Last 24 hours) at 06/20/2023 1257 Last data filed at 06/20/2023 0981 Gross per 24 hour   Intake 568.77 ml  Output 800 ml  Net -231.23 ml   Filed Weights   06/19/23 1150 06/19/23 1209  Weight: (!) 156 kg (!) 156.5 kg    Data Reviewed: I have personally reviewed and interpreted daily labs, tele strips, imagings as discussed above. I reviewed all nursing notes, pharmacy notes, vitals, pertinent old records I have discussed plan of care as described above with RN and patient/family.  CBC: Recent Labs  Lab 06/19/23 1211  WBC 7.9  HGB 15.7  HCT 46.4  MCV 83.2  PLT 236   Basic Metabolic Panel: Recent Labs  Lab 06/19/23 1211 06/20/23 0443  NA 137 135  K 4.3 3.6  CL 105 104  CO2 25 24  GLUCOSE 113* 120*  BUN 11 11  CREATININE 0.96 0.91  CALCIUM 8.9 8.0*  MG  --  2.1    Studies: No results found.  Scheduled Meds:  aspirin EC  81 mg Oral Daily   digoxin  0.25 mg Intravenous Q2H   [START ON 06/21/2023] digoxin  0.25 mg Oral Daily   metoprolol tartrate  25 mg Oral QID   pantoprazole  40 mg Oral Daily   rosuvastatin  40 mg Oral Daily   ticagrelor  90 mg Oral BID   Continuous Infusions:  diltiazem (CARDIZEM) infusion Stopped (06/20/23 0004)   heparin 1,650 Units/hr (06/20/23 1213)   PRN Meds: acetaminophen, ondansetron (ZOFRAN) IV  Time spent: 35 minutes  Author: Gillis Santa. MD [facility] 06/20/2023 12:57 PM  To reach On-call, see care teams to locate the attending and reach out to them via [email or web address]. If 7PM-7AM, please contact night-coverage If you still have difficulty reaching the attending provider, please page the [facility] (Director on Call) for [facility] on amion for assistance.

## 2023-06-20 NOTE — Consult Note (Signed)
Cardiology Consultation   Patient ID: JOBEY CONG MRN: [id number]; DOB: 11/20/1975  Admit date: 06/19/2023 Date of Consult: 06/20/2023  PCP:  Patient, No Pcp Per   [facility] Providers Cardiologist:  Grant Fair, MD   {  Patient Profile:   Grant [name] is a 47 y.o. male with a hx of Cad s/p PCI/DES mRCA 01/2023, suspected OSA, HLD, obesity, nephrolithiasis who is being seen 06/20/2023 for the evaluation of chest pain at the request of dr. Lucianne [name].  History of Present Illness:   Grant [name] was admitted 01/2023 with UA. Cardiac cath showed culprit lesion mid RCA 90% stenosis treated with balloon angioplasty and DES placement. He had residual disease 50% p-m left Cx stenosis and 15% ostial LAD stenosis. Echo showed normal LVEF. Troponin was negative.   He was last seen 02/17/23 and was overall stable from a cardiac perspective. He was still uninterested in sleep study.  The patient presented to the ER 06/19/23 with shortness of breath and chest pain. He started experiencing shortness of breath a couple days ago. The next day he had mild chest pain with persistent SOB. He felt his heart pounding, but not being fast. He denies lightheadedness and dizziness. No lower leg edema. He reports compliance with medications. OF note, father recently passed 2 weeks ago.  In the ER BP 119/66 pulse 75, RR 18, afebrile, 96% O2. CXR non-acute. Labs unremarkable. EKG showed Afib RVR with rates in the 120s started on IV dilt. He was admitted for further work-up.   Past Medical History:  Diagnosis Date   Kidney stones     Past Surgical History:  Procedure Laterality Date   APPENDECTOMY     CORONARY STENT INTERVENTION N/A 02/10/2023   Procedure: CORONARY STENT INTERVENTION;  Surgeon: Grant Lex, MD;  Location: [facility] INVASIVE CV LAB;  Service: Cardiovascular;  Laterality: N/A;   LEFT HEART CATH AND CORONARY ANGIOGRAPHY N/A 02/10/2023   Procedure: LEFT HEART CATH AND CORONARY  ANGIOGRAPHY;  Surgeon: Grant Lex, MD;  Location: [facility] INVASIVE CV LAB;  Service: Cardiovascular;  Laterality: N/A;   TONSILLECTOMY       Home Medications:  Prior to Admission medications   Medication Sig Start Date End Date Taking? Authorizing Provider  aspirin EC 81 MG tablet Take 1 tablet (81 mg total) by mouth daily. Swallow whole. 02/12/23  Yes Arnetha Courser, MD  metoprolol succinate (TOPROL-XL) 25 MG 24 hr tablet Take 1 tablet (25 mg total) by mouth daily. 02/12/23  Yes Arnetha Courser, MD  omeprazole (PRILOSEC) 40 MG capsule Take 1 capsule (40 mg total) by mouth daily. 10/22/22  Yes [name], Mihai, MD  ondansetron (ZOFRAN-ODT) 8 MG disintegrating tablet Take 1 tablet (8 mg total) by mouth every 8 (eight) hours as needed for nausea. 06/07/23  Yes Grant Kaplan, MD  rosuvastatin (CRESTOR) 40 MG tablet Take 1 tablet (40 mg total) by mouth daily. 02/12/23  Yes Arnetha Courser, MD  ticagrelor (BRILINTA) 90 MG TABS tablet Take 1 tablet (90 mg total) by mouth 2 (two) times daily. 02/11/23  Yes Arnetha Courser, MD  oxyCODONE (ROXICODONE) 5 MG immediate release tablet Take 1 tablet (5 mg total) by mouth every 6 (six) hours as needed for severe pain. Patient not taking: Reported on 06/19/2023 03/22/23   Grant Leiter, DO    Inpatient Medications: Scheduled Meds:  aspirin EC  81 mg Oral Daily   enoxaparin (LOVENOX) injection  40 mg Subcutaneous Q24H   pantoprazole  40 mg Oral  Daily   rosuvastatin  40 mg Oral Daily   ticagrelor  90 mg Oral BID   Continuous Infusions:  diltiazem (CARDIZEM) infusion Stopped (06/20/23 0004)   PRN Meds: acetaminophen, ondansetron (ZOFRAN) IV  Allergies:   No Known Allergies  Social History:   Social History   Socioeconomic History   Marital status: Married    Spouse name: Not on file   Number of children: Not on file   Years of education: Not on file   Highest education level: Not on file  Occupational History   Not on file  Tobacco Use   Smoking  status: Never   Smokeless tobacco: Current    Types: Chew  Vaping Use   Vaping status: Never Used  Substance and Sexual Activity   Alcohol use: Yes    Comment: Seldom   Drug use: Not Currently   Sexual activity: Not Currently  Other Topics Concern   Not on file  Social History Narrative   Not on file   Social Determinants of Health   Financial Resource Strain: Not on file  Food Insecurity: No Food Insecurity (06/19/2023)   Hunger Vital Sign    Worried About Running Out of Food in the Last Year: Never true    Ran Out of Food in the Last Year: Never true  Transportation Needs: No Transportation Needs (06/19/2023)   PRAPARE - Administrator, Civil Service (Medical): No    Lack of Transportation (Non-Medical): No  Physical Activity: Not on file  Stress: Not on file  Social Connections: Unknown (12/05/2021)   Received from [facility]   Social Network    Social Network: Not on file  Intimate Partner Violence: Not At Risk (06/19/2023)   Humiliation, Afraid, Rape, and Kick questionnaire    Fear of Current or Ex-Partner: No    Emotionally Abused: No    Physically Abused: No    Sexually Abused: No    Family History:    Family History  Problem Relation Age of Onset   CAD Father      ROS:  Please see the history of present illness.   All other ROS reviewed and negative.     Physical Exam/Data:   Vitals:   06/20/23 0700 06/20/23 0717 06/20/23 0730 06/20/23 0810  BP: (!) 91/52  104/68 116/61  Pulse: 95   80  Resp: 17  17 16   Temp:    98.4 F (36.9 C)  TempSrc:    Oral  SpO2: 99% 98% 98% 98%  Weight:      Height:        Intake/Output Summary (Last 24 hours) at 06/20/2023 0831 Last data filed at 06/20/2023 0718 Gross per 24 hour  Intake 568.77 ml  Output 800 ml  Net -231.23 ml      06/19/2023   12:09 PM 06/19/2023   11:50 AM 06/07/2023    8:10 AM  Last 3 Weights  Weight (lbs) 345 lb 343 lb 14.7 oz 345 lb  Weight (kg) 156.491  kg 156 kg 156.491 kg     Body mass index is 45.52 kg/m.  General:  Well nourished, well developed, in no acute distress HEENT: normal Neck: no JVD Vascular: No carotid bruits; Distal pulses 2+ bilaterally Cardiac:  normal S1, S2; Irreg IRreg; no murmur  Lungs:  clear to auscultation bilaterally, no wheezing, rhonchi or rales  Abd: soft, nontender, no hepatomegaly  Ext: no edema Musculoskeletal:  No deformities, BUE and BLE strength normal and  equal Skin: warm and dry  Neuro:  CNs 2-12 intact, no focal abnormalities noted Psych:  Normal affect   EKG:  The EKG was personally reviewed and demonstrates:  Afib 125bpm, no sig ischemic changes Telemetry:  Telemetry was personally reviewed and demonstrates:  Afib HR90-110, up to 120s at times  Relevant CV Studies:  Echo 01/2023 1. Left ventricular ejection fraction, by estimation, is 50 to 55%. The  left ventricle has low normal function. The left ventricle has no regional  wall motion abnormalities. There is moderate left ventricular hypertrophy.  Left ventricular diastolic  parameters were normal.   2. Right ventricular systolic function is normal. The right ventricular  size is normal.   3. The mitral valve is normal in structure. No evidence of mitral valve  regurgitation.   4. The aortic valve is grossly normal. Aortic valve regurgitation is not  visualized.   5. Aortic dilatation noted. There is mild dilatation of the aortic root,  measuring 44 mm.   LHC 01/2023   Prox RCA lesion is 15% stenosed.   CULPRIT LESION mid RCA lesion is 90% stenosed. ->  Heavily fibrous focal concentric.  TIMI-3 flow   After scoring balloon angioplasty was performed using a BALLN SCOREFLEX 3.50X15, A drug-eluting stent was successfully placed using a STENT ONYX FRONTIER 3.5X22.  The stent was then postdilated and tapered fashion from 4.1 to 4.0 mm.  Post intervention, there is a 0% residual stenosis. TIMI-3 flow post   Prox Cx to Mid Cx lesion is 50%  stenosed.   Ost LAD to Prox LAD lesion is 15% stenosed.   The Left Ventricular systolic function is normal. The Left Ventricular Ejection Fraction is 55-65% by visual estimate.   LV end diastolic pressure is normal.     Successful complex ScoreFlex PTCA based angioplasty and DES PCI of CULPRIT LESION (mid RCA fibrous 90%) with Onyx Frontier DES 3.5 mm x 22 mm postdilated tapered fashion from 4.1 to 4.0 mm.      RECOMMENDATIONS Overnight monitoring with TR band removal and gentle hydration. Continue to titrate GDMT for CAD-PCI   In the absence of any other complications or medical issues, we expect the patient to be ready for discharge from an interventional cardiology perspective on 02/11/2023.   Recommend uninterrupted dual antiplatelet therapy with Aspirin 81mg  daily and Ticagrelor 90mg  twice daily for a minimum of 12 months (ACS-Class I recommendation). [name], MD    Laboratory Data:  High Sensitivity Troponin:   Recent Labs  Lab 06/07/23 0822 06/07/23 1016 06/19/23 1211 06/19/23 1544  TROPONINIHS 3 3 4 4      Chemistry Recent Labs  Lab 06/19/23 1211 06/20/23 0443  NA 137 135  K 4.3 3.6  CL 105 104  CO2 25 24  GLUCOSE 113* 120*  BUN 11 11  CREATININE 0.96 0.91  CALCIUM 8.9 8.0*  MG  --  2.1  GFRNONAA >60 >60  ANIONGAP 7 7    No results for input(s): "PROT", "ALBUMIN", "AST", "ALT", "ALKPHOS", "BILITOT" in the last 168 hours. Lipids No results for input(s): "CHOL", "TRIG", "HDL", "LABVLDL", "LDLCALC", "CHOLHDL" in the last 168 hours.  Hematology Recent Labs  Lab 06/19/23 1211  WBC 7.9  RBC 5.58  HGB 15.7  HCT 46.4  MCV 83.2  MCH 28.1  MCHC 33.8  RDW 12.8  PLT 236   Thyroid  Recent Labs  Lab 06/19/23 1211  TSH 1.527    BNPNo results for input(s): "BNP", "PROBNP" in the  last 168 hours.  DDimer No results for input(s): "DDIMER" in the last 168 hours.   Radiology/Studies:  DG Chest 2 View  Result Date: 06/19/2023 CLINICAL DATA:   cp/sob EXAM: CHEST - 2 VIEW COMPARISON:  June 07, 2023 FINDINGS: The cardiomediastinal silhouette is unchanged in contour. No pleural effusion. No pneumothorax. No acute pleuroparenchymal abnormality. Visualized abdomen is unremarkable. Multilevel degenerative changes of the thoracic spine. IMPRESSION: No acute cardiopulmonary abnormality. Electronically Signed   By: Meda Klinefelter M.D.   On: 06/19/2023 12:51     Assessment and Plan:   New onset Afib - presented with SOB and chest pain found to be in rapid afib started on IV dilt, but later stopped due to low BP - he remains in Afib with reasonable rates - PTA Toprol 25mg  daily - he was on DAPT with ASA and Brilinta. Lovenox started in the hospital - CHADSVASC of 1 (PAD) however given comorbidities and higher risk for reoccurrence may require long-term anticoagulation. Will discuss a/c with MD - echo from 01/2023 showed LVEF 50-55%, moderate LVH - Keep Mag>2 and K>4 - TSH wnl - he is not a good candidate for amio given young age - will start Lopressor 25mg BID - he may require TEE/DCCV, will make NPO at midnight  CAD s/p PCID/DEC mRCA 01/2023 - chest pain in the setting of rapid afib - HS troponin negative - he reports compliance with DAPT - repeat echo has been ordered - continue statin, BB, ASA, Brilinta  HLD - LDL 117 - PTA Crestor 40mg  - would consider PCSK9i as OP  OSA - patient reluctant to undergo sleep study testing, this was discussed  For questions or updates, please contact [facility] Please consult [email or web address] for contact info under    Signed, Annalia Metzger David Stall, PA-C  06/20/2023 8:31 AM

## 2023-06-20 NOTE — Consult Note (Signed)
PHARMACY - ANTICOAGULATION CONSULT NOTE  Pharmacy Consult for Heparin Indication: atrial fibrillation  No Known Allergies  Patient Measurements: Height: 6\' 1"  (185.4 cm) Weight: (!) 156.5 kg (345 lb) IBW/kg (Calculated) : 79.9 Heparin Dosing Weight: 116.9 kg  Vital Signs: Temp: 98.2 F (36.8 C) (11/25 1542) Temp Source: Oral (11/25 1542) BP: 116/62 (11/25 1735) Pulse Rate: 90 (11/25 1735)  Labs: Recent Labs    06/19/23 1211 06/19/23 1544 06/20/23 0443 06/20/23 1125 06/20/23 1752  HGB 15.7  --   --   --   --   HCT 46.4  --   --   --   --   PLT 236  --   --   --   --   APTT  --   --   --  25  --   LABPROT  --   --   --  13.9  --   INR  --   --   --  1.1  --   HEPARINUNFRC  --   --   --   --  0.21*  CREATININE 0.96  --  0.91  --   --   TROPONINIHS 4 4  --   --   --     Estimated Creatinine Clearance: 156.8 mL/min (by C-G formula based on SCr of 0.91 mg/dL).   Medical History: Past Medical History:  Diagnosis Date   Kidney stones    Pertinent Medications Enoxaparin last dose 11/24 @ 1946 Brilinta 90 mg BID New onset afib - not PTA anticoagulation   Baseline: NA, ordered   Assessment: Grant [name] is a 47 yo male who presented to the ED 11/24 with SOB and chest pain. They were found to be in rapid afib - new onset. CHADSVASC of 1 (PCI). No formal diagnosis of HTN or diabetes per chart. No A1c in chart and not on any anti-hypertensive medications.  Date/Time HL Rate  Comment 11/25 1752 0.21 1650 un/hr Subtherapeutic   Goal of Therapy:  Heparin level 0.3-0.7 units/ml Monitor platelets by anticoagulation protocol: Yes   Plan:  HL subtherapeutic at 0.21 Give 1,750 unit heparin bolus Increase heparin infusion rate to 1850 units/hr Check heparin level in 6 hours and daily while on heparin  Continue to monitor CBC daily  Merryl Hacker, PharmD Clinical Pharmacist 06/20/2023 6:36 PM

## 2023-06-21 ENCOUNTER — Inpatient Hospital Stay
Admit: 2023-06-21 | Discharge: 2023-06-21 | Disposition: A | Discharge status: Home / Self Care | Payer: BC Managed Care – PPO | Attending: Student | Admitting: Student

## 2023-06-21 DIAGNOSIS — I4891 Unspecified atrial fibrillation: Secondary | ICD-10-CM

## 2023-06-21 LAB — CBC
HCT: 45.5 % (ref 39.0–52.0)
Hemoglobin: 15 g/dL (ref 13.0–17.0)
MCH: 28 pg (ref 26.0–34.0)
MCHC: 33 g/dL (ref 30.0–36.0)
MCV: 84.9 fL (ref 80.0–100.0)
Platelets: 200 10*3/uL (ref 150–400)
RBC: 5.36 MIL/uL (ref 4.22–5.81)
RDW: 12.9 % (ref 11.5–15.5)
WBC: 8 10*3/uL (ref 4.0–10.5)
nRBC: 0 % (ref 0.0–0.2)

## 2023-06-21 LAB — BASIC METABOLIC PANEL
Anion gap: 5 (ref 5–15)
BUN: 10 mg/dL (ref 6–20)
CO2: 26 mmol/L (ref 22–32)
Calcium: 8.5 mg/dL — ABNORMAL LOW (ref 8.9–10.3)
Chloride: 109 mmol/L (ref 98–111)
Creatinine, Ser: 0.8 mg/dL (ref 0.61–1.24)
GFR, Estimated: >60 mL/min (ref >60)
Glucose, Bld: 112 mg/dL — ABNORMAL HIGH (ref 70–99)
Potassium: 3.7 mmol/L (ref 3.5–5.1)
Sodium: 140 mmol/L (ref 135–145)

## 2023-06-21 LAB — HEPARIN LEVEL (UNFRACTIONATED): Heparin Unfractionated: 0.34 [IU]/mL (ref 0.30–0.70)

## 2023-06-21 LAB — MAGNESIUM: Magnesium: 2.2 mg/dL (ref 1.7–2.4)

## 2023-06-21 MED ORDER — SODIUM CHLORIDE 0.9% FLUSH
10.0000 mL | Freq: Two times a day (BID) | INTRAVENOUS | Status: DC
  Administered 2023-06-22 (×2): 10 mL via INTRAVENOUS

## 2023-06-21 MED ORDER — CLOPIDOGREL BISULFATE 75 MG PO TABS
300.0000 mg | ORAL_TABLET | Freq: Once | ORAL | Status: AC
  Administered 2023-06-21: 300 mg via ORAL
  Filled 2023-06-21: qty 4

## 2023-06-21 MED ORDER — CLOPIDOGREL BISULFATE 75 MG PO TABS
75.0000 mg | ORAL_TABLET | Freq: Every day | ORAL | Status: DC
  Administered 2023-06-22 – 2023-06-23 (×2): 75 mg via ORAL
  Filled 2023-06-21 (×2): qty 1

## 2023-06-21 MED ORDER — ALPRAZOLAM 0.5 MG PO TABS
0.5000 mg | ORAL_TABLET | Freq: Three times a day (TID) | ORAL | Status: DC | PRN
  Administered 2023-06-21 (×2): 0.5 mg via ORAL
  Filled 2023-06-21 (×2): qty 1

## 2023-06-21 MED ORDER — APIXABAN 5 MG PO TABS
5.0000 mg | ORAL_TABLET | Freq: Two times a day (BID) | ORAL | Status: DC
  Administered 2023-06-21 – 2023-06-23 (×5): 5 mg via ORAL
  Filled 2023-06-21 (×5): qty 1

## 2023-06-21 MED ORDER — POTASSIUM CHLORIDE CRYS ER 20 MEQ PO TBCR
40.0000 meq | EXTENDED_RELEASE_TABLET | Freq: Once | ORAL | Status: AC
  Administered 2023-06-21: 40 meq via ORAL
  Filled 2023-06-21: qty 2

## 2023-06-21 MED ORDER — METOPROLOL SUCCINATE ER 100 MG PO TB24
100.0000 mg | ORAL_TABLET | Freq: Every day | ORAL | Status: DC
  Administered 2023-06-21 – 2023-06-22 (×2): 100 mg via ORAL
  Filled 2023-06-21 (×3): qty 1

## 2023-06-21 MED ORDER — PERFLUTREN LIPID MICROSPHERE
1.0000 mL | INTRAVENOUS | Status: DC | PRN
  Administered 2023-06-21: 3 mL via INTRAVENOUS

## 2023-06-21 NOTE — Progress Notes (Signed)
Rounding Note    Patient Name: Grant [name] Date of Encounter: 06/21/2023  [facility] Cardiologist: Grant Fair, MD   Subjective   Still in Afib with mostly controlled rates. BP low and he was started on digoxin. He denies chest pain or SOB.   Inpatient Medications    Scheduled Meds:  aspirin EC  81 mg Oral Daily   digoxin  0.25 mg Oral Daily   metoprolol tartrate  25 mg Oral QID   pantoprazole  40 mg Oral Daily   rosuvastatin  40 mg Oral Daily   ticagrelor  90 mg Oral BID   Continuous Infusions:  diltiazem (CARDIZEM) infusion Stopped (06/20/23 0004)   heparin 1,850 Units/hr (06/20/23 2231)   PRN Meds: acetaminophen, ondansetron (ZOFRAN) IV   Vital Signs    Vitals:   06/21/23 0300 06/21/23 0425 06/21/23 0651 06/21/23 0736  BP:  96/66    Pulse:      Resp: 15 15 20    Temp:  98 F (36.7 C)  97.7 F (36.5 C)  TempSrc:  Oral  Oral  SpO2:  99%  98%  Weight:      Height:        Intake/Output Summary (Last 24 hours) at 06/21/2023 0833 Last data filed at 06/21/2023 0400 Gross per 24 hour  Intake 147.87 ml  Output 1500 ml  Net -1352.13 ml      06/19/2023   12:09 PM 06/19/2023   11:50 AM 06/07/2023    8:10 AM  Last 3 Weights  Weight (lbs) 345 lb 343 lb 14.7 oz 345 lb  Weight (kg) 156.491 kg 156 kg 156.491 kg      Telemetry    Afib HR 70s up to 110, PVCs - Personally Reviewed  ECG    No new - Personally Reviewed  Physical Exam   GEN: No acute distress.   Neck: No JVD Cardiac: Irreg Irreg, no murmurs, rubs, or gallops.  Respiratory: Clear to auscultation bilaterally. GI: Soft, nontender, non-distended  MS: No edema; No deformity. Neuro:  Nonfocal  Psych: Normal affect   Labs    High Sensitivity Troponin:   Recent Labs  Lab 06/07/23 0822 06/07/23 1016 06/19/23 1211 06/19/23 1544  TROPONINIHS 3 3 4 4      Chemistry Recent Labs  Lab 06/19/23 1211 06/20/23 0443 06/21/23 0215  NA 137 135 140  K 4.3 3.6 3.7  CL 105  104 109  CO2 25 24 26   GLUCOSE 113* 120* 112*  BUN 11 11 10   CREATININE 0.96 0.91 0.80  CALCIUM 8.9 8.0* 8.5*  MG  --  2.1 2.2  GFRNONAA >60 >60 >60  ANIONGAP 7 7 5     Lipids No results for input(s): "CHOL", "TRIG", "HDL", "LABVLDL", "LDLCALC", "CHOLHDL" in the last 168 hours.  Hematology Recent Labs  Lab 06/19/23 1211 06/21/23 0215  WBC 7.9 8.0  RBC 5.58 5.36  HGB 15.7 15.0  HCT 46.4 45.5  MCV 83.2 84.9  MCH 28.1 28.0  MCHC 33.8 33.0  RDW 12.8 12.9  PLT 236 200   Thyroid  Recent Labs  Lab 06/19/23 1211  TSH 1.527    BNPNo results for input(s): "BNP", "PROBNP" in the last 168 hours.  DDimer No results for input(s): "DDIMER" in the last 168 hours.   Radiology    DG Chest 2 View  Result Date: 06/19/2023 CLINICAL DATA:  cp/sob EXAM: CHEST - 2 VIEW COMPARISON:  June 07, 2023 FINDINGS: The cardiomediastinal silhouette is unchanged in contour. No  pleural effusion. No pneumothorax. No acute pleuroparenchymal abnormality. Visualized abdomen is unremarkable. Multilevel degenerative changes of the thoracic spine. IMPRESSION: No acute cardiopulmonary abnormality. Electronically Signed   By: Meda Klinefelter M.D.   On: 06/19/2023 12:51    Cardiac Studies   Echo 01/2023 1. Left ventricular ejection fraction, by estimation, is 50 to 55%. The  left ventricle has low normal function. The left ventricle has no regional  wall motion abnormalities. There is moderate left ventricular hypertrophy.  Left ventricular diastolic  parameters were normal.   2. Right ventricular systolic function is normal. The right ventricular  size is normal.   3. The mitral valve is normal in structure. No evidence of mitral valve  regurgitation.   4. The aortic valve is grossly normal. Aortic valve regurgitation is not  visualized.   5. Aortic dilatation noted. There is mild dilatation of the aortic root,  measuring 44 mm.    LHC 01/2023   Prox RCA lesion is 15% stenosed.   CULPRIT LESION  mid RCA lesion is 90% stenosed. ->  Heavily fibrous focal concentric.  TIMI-3 flow   After scoring balloon angioplasty was performed using a BALLN SCOREFLEX 3.50X15, A drug-eluting stent was successfully placed using a STENT ONYX FRONTIER 3.5X22.  The stent was then postdilated and tapered fashion from 4.1 to 4.0 mm.  Post intervention, there is a 0% residual stenosis. TIMI-3 flow post   Prox Cx to Mid Cx lesion is 50% stenosed.   Ost LAD to Prox LAD lesion is 15% stenosed.   The Left Ventricular systolic function is normal. The Left Ventricular Ejection Fraction is 55-65% by visual estimate.   LV end diastolic pressure is normal.     Successful complex ScoreFlex PTCA based angioplasty and DES PCI of CULPRIT LESION (mid RCA fibrous 90%) with Onyx Frontier DES 3.5 mm x 22 mm postdilated tapered fashion from 4.1 to 4.0 mm.      RECOMMENDATIONS Overnight monitoring with TR band removal and gentle hydration. Continue to titrate GDMT for CAD-PCI   In the absence of any other complications or medical issues, we expect the patient to be ready for discharge from an interventional cardiology perspective on 02/11/2023.   Recommend uninterrupted dual antiplatelet therapy with Aspirin 81mg  daily and Ticagrelor 90mg  twice daily for a minimum of 12 months (ACS-Class I recommendation). Grant Lemma, MD  Patient Profile     47 y.o. male Cad s/p PCI/DES mRCA 01/2023, suspected OSA, HLD, obesity, nephrolithiasis who is being seen 06/20/2023 for the evaluation of chest pain   Assessment & Plan    New onset Afib - presented with SOB and chest pain found to be in rapid afib started on IV dilt, but this was later stopped due to low BP - he remains in Afib with mostly controlled rates - PTA Toprol 25mg  daily - started on Lopressor 25mg  Q6H - PTA DAPT with ASA and Brilinta.  - CHADSVASC of 1 (PAD) started on IV heparin for possible TEE/DCCV - echo from 01/2023 showed LVEF 50-55%, moderate LVH. Repeat echo  ordered - Keep Mag>2 and K>4 - TSH wnl - difficult to control HR due to low BP. Started on digoxin 0.25mg  daily. Check a dig level 11/29 - Patient would like to wait to do cardioversion after 3-4 weeks of anticoagulation. Transition IV heparin to Eliquis 5mg  BID   CAD s/p PCID/DEC mRCA 01/2023 - chest pain in the setting of rapid afib - HS troponin negative - he reports compliance  with DAPT - repeat echo has been ordered - continue statin, BB, ASA, Brilinta. Plan to switch Brilinta to Plavix given addition of ELiquis   HLD - LDL 117 - PTA Crestor 40mg  - would consider PCSK9i as OP   OSA - patient reluctant to undergo sleep study testing, this was discussed  For questions or updates, please contact  [facility] Please consult [email or web address] for contact info under        Signed, Jaxsin Bottomley David Stall, PA-C  06/21/2023, 8:33 AM

## 2023-06-21 NOTE — Progress Notes (Signed)
[facility] Progress Note  Patient: Grant [name]    ZOX:[id number]  DOA: 06/19/2023     Date of Service: the patient was seen and examined on 06/21/2023  Chief Complaint  Patient presents with   Shortness of Breath   Chest Pain   Brief hospital course: Grant [name] is a 47 y.o. male with medical history significant of CAD s/p PCI with DES to mid RCA (July 2024), morbid obesity with BMI of 45, hyperlipidemia, OSA, nephrolithiasis, who presents to the ED due to chest pain and shortness of breath which started in the morning on 11/23 and gradually getting worse and felt palpitations.  Mild chest pain very short episode which resolved and did not recur.  Not any other complaints. Per chart review, patient went to the ED on 11/12 for dizziness with nausea and vomiting. EKG and telemetry at that time demonstrated sinus rhythm with multiple PVCs   ED Workup: BUN 19/66, HR 116, 100% on RA, afebrile. normal CBC, BMP with exception of glucose of 113, and troponin negative x 2. Chest x-ray with no active disease. EKG obtained with new onset atrial fibrillation with RVR. Heart rate was not able to be controlled with bolus of diltiazem. He was started on diltiazem infusion and [facility] contacted for admission.  Cardiology consulted, further management as below.  Assessment and Plan:  # A-fib with RVR Continue to monitor on telemetry CHA2DS2-VASc score 1 S/p Cardizem IV and s/p Cardizem IV infusion, s/p heparin IV infusion  S/p Lopressor 25 mg p.o. every 6 hourly due to soft blood pressure S/p Digoxin 0.25 mg IV every 2 hourly x 2 doses followed by 0.25 mg p.o. daily As per cardiology patient may need TEE and cardioversion 11/26 started Eliquis 5 mg p.o. twice daily, Plavix 300 loading followed by 75 mg p.o. daily, discontinued Brilinta.  Started Toprol-XL 100 mg p.o. daily and digoxin 0.25 mg p.o. daily   # CAD s/p stent S/p aspirin and Brilinta, transition to Plavix and Eliquis twice daily on  11/26 Chest pain resolved most likely due to demand ischemia, troponin negative  OSA, follow with PCP for sleep study as an outpatient Morbid obesity Body mass index is 45.52 kg/m.  Interventions: Calorie restricted diet and daily exercise advised to lose body weight.  Lifestyle modification discussed.  Diet: Heart healthy diet DVT Prophylaxis: Therapeutic Anticoagulation with heparin IV infusion    Advance goals of care discussion: Full code  Family Communication: family was present at bedside, at the time of interview.  The pt provided permission to discuss medical plan with the family. Opportunity was given to ask question and all questions were answered satisfactorily.   Disposition:  Pt is from home, admitted with A-fib with RVR, still has A-fib RVR, s/p IV Cardizem infusion and IV heparin infusion, which precludes a safe discharge. Discharge to home, when stable, need cardiology clearance. Patient may need TEE and cardioversion  Subjective: No significant events overnight, patient denies any chest pain, no shortness of breath.  Patient still in A-fib with RVR but asymptomatic.  Patient wanted to go home, recommended to stay another night as he is not cleared by cardiology and still has A-fib with RVR.   Physical Exam: General: NAD, lying comfortably Appear in no distress, affect appropriate Eyes: PERRLA ENT: Oral Mucosa Clear, moist  Neck: no JVD,  Cardiovascular: irregular rhythm, no Murmur,  Respiratory: good respiratory effort, Bilateral Air entry equal and Decreased, no Crackles, no wheezes Abdomen: Bowel Sound present, Soft and  no tenderness,  Skin: no rashes Extremities: no Pedal edema, no calf tenderness Neurologic: without any new focal findings Gait not checked due to patient safety concerns  Vitals:   06/21/23 0300 06/21/23 0425 06/21/23 0651 06/21/23 0736  BP:  96/66    Pulse:      Resp: 15 15 20    Temp:  98 F (36.7 C)  97.7 F (36.5 C)  TempSrc:  Oral   Oral  SpO2:  99%  98%  Weight:      Height:        Intake/Output Summary (Last 24 hours) at 06/21/2023 1438 Last data filed at 06/21/2023 0400 Gross per 24 hour  Intake 147.87 ml  Output 1100 ml  Net -952.13 ml   Filed Weights   06/19/23 1150 06/19/23 1209  Weight: (!) 156 kg (!) 156.5 kg    Data Reviewed: I have personally reviewed and interpreted daily labs, tele strips, imagings as discussed above. I reviewed all nursing notes, pharmacy notes, vitals, pertinent old records I have discussed plan of care as described above with RN and patient/family.  CBC: Recent Labs  Lab 06/19/23 1211 06/21/23 0215  WBC 7.9 8.0  HGB 15.7 15.0  HCT 46.4 45.5  MCV 83.2 84.9  PLT 236 200   Basic Metabolic Panel: Recent Labs  Lab 06/19/23 1211 06/20/23 0443 06/21/23 0215  NA 137 135 140  K 4.3 3.6 3.7  CL 105 104 109  CO2 25 24 26   GLUCOSE 113* 120* 112*  BUN 11 11 10   CREATININE 0.96 0.91 0.80  CALCIUM 8.9 8.0* 8.5*  MG  --  2.1 2.2    Studies: No results found.  Scheduled Meds:  apixaban  5 mg Oral BID   clopidogrel  300 mg Oral Once   Followed by   [START ON 06/22/2023] clopidogrel  75 mg Oral Daily   digoxin  0.25 mg Oral Daily   metoprolol succinate  100 mg Oral Daily   pantoprazole  40 mg Oral Daily   rosuvastatin  40 mg Oral Daily   Continuous Infusions:   PRN Meds: acetaminophen, ALPRAZolam, ondansetron (ZOFRAN) IV  Time spent: 35 minutes  Author: Gillis Santa. MD [facility] 06/21/2023 2:38 PM  To reach On-call, see care teams to locate the attending and reach out to them via [email or web address]. If 7PM-7AM, please contact night-coverage If you still have difficulty reaching the attending provider, please page the [facility] (Director on Call) for [facility] on amion for assistance.

## 2023-06-21 NOTE — Consult Note (Signed)
PHARMACY - ANTICOAGULATION CONSULT NOTE  Pharmacy Consult for Heparin Indication: atrial fibrillation  No Known Allergies  Patient Measurements: Height: 6\' 1"  (185.4 cm) Weight: (!) 156.5 kg (345 lb) IBW/kg (Calculated) : 79.9 Heparin Dosing Weight: 116.9 kg  Vital Signs: Temp: 97.9 F (36.6 C) (11/26 0000) Temp Source: Oral (11/26 0000) BP: 102/53 (11/26 0000) Pulse Rate: 90 (11/25 1735)  Labs: Recent Labs    06/19/23 1211 06/19/23 1544 06/20/23 0443 06/20/23 1125 06/20/23 1752 06/21/23 0215  HGB 15.7  --   --   --   --  15.0  HCT 46.4  --   --   --   --  45.5  PLT 236  --   --   --   --  200  APTT  --   --   --  25  --   --   LABPROT  --   --   --  13.9  --   --   INR  --   --   --  1.1  --   --   HEPARINUNFRC  --   --   --   --  0.21* 0.34  CREATININE 0.96  --  0.91  --   --  0.80  TROPONINIHS 4 4  --   --   --   --     Estimated Creatinine Clearance: 178.4 mL/min (by C-G formula based on SCr of 0.8 mg/dL).   Medical History: Past Medical History:  Diagnosis Date   Kidney stones    Pertinent Medications Enoxaparin last dose 11/24 @ 1946 Brilinta 90 mg BID New onset afib - not PTA anticoagulation   Baseline: NA, ordered   Assessment: Grant [name] is a 47 yo male who presented to the ED 11/24 with SOB and chest pain. They were found to be in rapid afib - new onset. CHADSVASC of 1 (PCI). No formal diagnosis of HTN or diabetes per chart. No A1c in chart and not on any anti-hypertensive medications.  Date/Time HL Rate  Comment 11/25 1752 0.21 1650 un/hr Subtherapeutic 11/26 0215 0.34 1850 un/hr Therapeutic x 1   Goal of Therapy:  Heparin level 0.3-0.7 units/ml Monitor platelets by anticoagulation protocol: Yes   Plan:  Continue heparin infusion rate at 1850 units/hr Recheck heparin level in 6 hours and daily while on heparin  Continue to monitor CBC daily  Otelia Sergeant, PharmD, [facility] 06/21/2023 3:14 AM

## 2023-06-21 NOTE — Plan of Care (Signed)

## 2023-06-21 NOTE — Progress Notes (Signed)
Patient ambulated around nursing station. Hr increased to 180, asymptomatic

## 2023-06-21 NOTE — Progress Notes (Signed)
Pt requesting something for anxiety.

## 2023-06-21 NOTE — Progress Notes (Signed)
Patient requesting something for anxiety

## 2023-06-22 ENCOUNTER — Inpatient Hospital Stay
Admit: 2023-06-22 | Discharge: 2023-06-22 | Disposition: A | Discharge status: Home / Self Care | Payer: BC Managed Care – PPO | Attending: Cardiovascular Disease | Admitting: Cardiovascular Disease

## 2023-06-22 ENCOUNTER — Other Ambulatory Visit: Payer: Self-pay

## 2023-06-22 ENCOUNTER — Inpatient Hospital Stay: Payer: BC Managed Care – PPO | Admitting: Certified Registered Nurse Anesthetist

## 2023-06-22 ENCOUNTER — Encounter
Admission: EM | Disposition: A | Discharge status: Home / Self Care | Payer: Self-pay | Source: Home / Self Care | Attending: Student

## 2023-06-22 DIAGNOSIS — I361 Nonrheumatic tricuspid (valve) insufficiency: Secondary | ICD-10-CM | POA: Diagnosis not present

## 2023-06-22 DIAGNOSIS — G4733 Obstructive sleep apnea (adult) (pediatric): Secondary | ICD-10-CM | POA: Diagnosis not present

## 2023-06-22 DIAGNOSIS — Z9861 Coronary angioplasty status: Secondary | ICD-10-CM

## 2023-06-22 DIAGNOSIS — R0602 Shortness of breath: Secondary | ICD-10-CM

## 2023-06-22 DIAGNOSIS — I4891 Unspecified atrial fibrillation: Secondary | ICD-10-CM

## 2023-06-22 DIAGNOSIS — I2510 Atherosclerotic heart disease of native coronary artery without angina pectoris: Secondary | ICD-10-CM

## 2023-06-22 HISTORY — PX: CARDIOVERSION: SHX1299

## 2023-06-22 HISTORY — PX: TEE WITHOUT CARDIOVERSION: SHX5443

## 2023-06-22 LAB — BASIC METABOLIC PANEL
Anion gap: 6 (ref 5–15)
BUN: 13 mg/dL (ref 6–20)
CO2: 26 mmol/L (ref 22–32)
Calcium: 8.8 mg/dL — ABNORMAL LOW (ref 8.9–10.3)
Chloride: 106 mmol/L (ref 98–111)
Creatinine, Ser: 0.89 mg/dL (ref 0.61–1.24)
GFR, Estimated: >60 mL/min (ref >60)
Glucose, Bld: 96 mg/dL (ref 70–99)
Potassium: 4.3 mmol/L (ref 3.5–5.1)
Sodium: 138 mmol/L (ref 135–145)

## 2023-06-22 LAB — ECHOCARDIOGRAM COMPLETE
Height: 73 in
S' Lateral: 3.3 cm
Weight: 5520 [oz_av]

## 2023-06-22 LAB — MAGNESIUM: Magnesium: 2.2 mg/dL (ref 1.7–2.4)

## 2023-06-22 LAB — CBC
HCT: 46.6 % (ref 39.0–52.0)
Hemoglobin: 15.5 g/dL (ref 13.0–17.0)
MCH: 28 pg (ref 26.0–34.0)
MCHC: 33.3 g/dL (ref 30.0–36.0)
MCV: 84.1 fL (ref 80.0–100.0)
Platelets: 218 10*3/uL (ref 150–400)
RBC: 5.54 MIL/uL (ref 4.22–5.81)
RDW: 12.9 % (ref 11.5–15.5)
WBC: 9.2 10*3/uL (ref 4.0–10.5)
nRBC: 0 % (ref 0.0–0.2)

## 2023-06-22 SURGERY — TRANSESOPHAGEAL ECHOCARDIOGRAM (TEE)
Anesthesia: General

## 2023-06-22 SURGERY — CARDIOVERSION
Anesthesia: General

## 2023-06-22 MED ORDER — BUTAMBEN-TETRACAINE-BENZOCAINE 2-2-14 % EX AERO
INHALATION_SPRAY | CUTANEOUS | Status: AC
  Filled 2023-06-22: qty 5

## 2023-06-22 MED ORDER — PROPOFOL 10 MG/ML IV BOLUS
INTRAVENOUS | Status: DC | PRN
  Administered 2023-06-22: 50 mg via INTRAVENOUS
  Administered 2023-06-22: 40 mg via INTRAVENOUS
  Administered 2023-06-22: 10 mg via INTRAVENOUS
  Administered 2023-06-22 (×4): 20 mg via INTRAVENOUS

## 2023-06-22 MED ORDER — ONDANSETRON HCL 4 MG/2ML IJ SOLN: 4.0000 mg | Freq: Once | INTRAMUSCULAR | Status: DC | PRN

## 2023-06-22 MED ORDER — SODIUM CHLORIDE 0.9 % IV SOLN: INTRAVENOUS | Status: DC | PRN

## 2023-06-22 MED ORDER — FENTANYL CITRATE (PF) 100 MCG/2ML IJ SOLN: 25.0000 ug | INTRAMUSCULAR | Status: DC | PRN

## 2023-06-22 MED ORDER — PHENYLEPHRINE HCL (PRESSORS) 10 MG/ML IV SOLN
INTRAVENOUS | Status: DC | PRN
  Administered 2023-06-22: 160 ug via INTRAVENOUS
  Administered 2023-06-22 (×4): 80 ug via INTRAVENOUS

## 2023-06-22 MED ORDER — OXYCODONE HCL 5 MG PO TABS: 5.0000 mg | ORAL_TABLET | Freq: Once | ORAL | Status: DC | PRN

## 2023-06-22 MED ORDER — LIDOCAINE VISCOUS HCL 2 % MT SOLN
OROMUCOSAL | Status: AC
  Filled 2023-06-22: qty 15

## 2023-06-22 MED ORDER — OXYCODONE HCL 5 MG/5ML PO SOLN: 5.0000 mg | Freq: Once | ORAL | Status: DC | PRN

## 2023-06-22 MED ORDER — EZETIMIBE 10 MG PO TABS
10.0000 mg | ORAL_TABLET | Freq: Every day | ORAL | Status: DC
  Administered 2023-06-22 – 2023-06-23 (×2): 10 mg via ORAL
  Filled 2023-06-22 (×2): qty 1

## 2023-06-22 NOTE — Anesthesia Preprocedure Evaluation (Signed)
Anesthesia Evaluation  Patient identified by MRN, date of birth, ID band Patient awake    Reviewed: Allergy & Precautions, NPO status , Patient's Chart, lab work & pertinent test results  Airway Mallampati: III  TM Distance: >3 FB Neck ROM: Full    Dental  (+) Teeth Intact   Pulmonary neg pulmonary ROS, sleep apnea    Pulmonary exam normal  + decreased breath sounds      Cardiovascular Exercise Tolerance: Good + CAD  negative cardio ROS Normal cardiovascular exam+ dysrhythmias Atrial Fibrillation  Rate:Tachycardia     Neuro/Psych negative neurological ROS  negative psych ROS   GI/Hepatic negative GI ROS, Neg liver ROS,,,  Endo/Other  negative endocrine ROS  Class 4 obesity  Renal/GU negative Renal ROS  negative genitourinary   Musculoskeletal negative musculoskeletal ROS (+)    Abdominal  (+) + obese  Peds negative pediatric ROS (+)  Hematology negative hematology ROS (+)   Anesthesia Other Findings Past Medical History: No date: Kidney stones  Past Surgical History: No date: APPENDECTOMY 02/10/2023: CORONARY STENT INTERVENTION; N/A     Comment:  Procedure: CORONARY STENT INTERVENTION;  Surgeon:               Marykay Lex, MD;  Location: [facility] INVASIVE CV LAB;                Service: Cardiovascular;  Laterality: N/A; 02/10/2023: LEFT HEART CATH AND CORONARY ANGIOGRAPHY; N/A     Comment:  Procedure: LEFT HEART CATH AND CORONARY ANGIOGRAPHY;                Surgeon: Marykay Lex, MD;  Location: [facility] INVASIVE               CV LAB;  Service: Cardiovascular;  Laterality: N/A; No date: TONSILLECTOMY  BMI    Body Mass Index: 45.52 kg/m      Reproductive/Obstetrics negative OB ROS                             Anesthesia Physical Anesthesia Plan  ASA: 3  Anesthesia Plan: General   Post-op Pain Management:    Induction: Intravenous  PONV Risk Score and Plan: Propofol  infusion and TIVA  Airway Management Planned: Natural Airway and Nasal Cannula  Additional Equipment:   Intra-op Plan:   Post-operative Plan:   Informed Consent: I have reviewed the patients History and Physical, chart, labs and discussed the procedure including the risks, benefits and alternatives for the proposed anesthesia with the patient or authorized representative who has indicated his/her understanding and acceptance.     Dental Advisory Given  Plan Discussed with: CRNA and Surgeon  Anesthesia Plan Comments:        Anesthesia Quick Evaluation

## 2023-06-22 NOTE — Anesthesia Procedure Notes (Signed)
Date/Time: 06/22/2023 12:35 PM  Performed by: Ginger Carne, CRNAPre-anesthesia Checklist: Patient identified, Emergency Drugs available, Suction available, Patient being monitored and Timeout performed Patient Re-evaluated:Patient Re-evaluated prior to induction Oxygen Delivery Method: Nasal cannula Preoxygenation: Pre-oxygenation with 100% oxygen Induction Type: IV induction

## 2023-06-22 NOTE — Progress Notes (Signed)
Attending Note Patient seen and examined, agree with detailed note above,   Patient presentation and plan discussed on rounds.    EKG lab work, chest x-ray, echocardiogram reviewed independently by myself  Successful TEE and cardioversion this afternoon, normal sinus rhythm restored Hypotension after propofol, slow improvement this afternoon tach up to low 100s systolic On Eliquis 5 twice daily, Plavix load yesterday, Plavix 75 today with digoxin, metoprolol succinate 100 daily -- Suspected sleep apnea based on procedure today and per wife, lots of snoring   On examination : alert oriented, no JVD, lungs clear to auscultation bilaterally, heart sounds regular normal S1-S2 no murmurs appreciated, abdomen soft nontender no significant lower extremity edema.  Musculoskeletal exam with good range of motion, neurologic exam grossly nonfocal     Latest Ref Rng & Units 06/22/2023    4:45 AM 06/21/2023    2:15 AM 06/19/2023   12:11 PM  CBC  WBC 4.0 - 10.5 K/uL 9.2  8.0  7.9   Hemoglobin 13.0 - 17.0 g/dL 16.1  09.6  04.5   Hematocrit 39.0 - 52.0 % 46.6  45.5  46.4   Platelets 150 - 400 K/uL 218  200  236       Latest Ref Rng & Units 06/22/2023    4:45 AM 06/21/2023    2:15 AM 06/20/2023    4:43 AM  BMP  Glucose 70 - 99 mg/dL 96  409  811   BUN 6 - 20 mg/dL 13  10  11    Creatinine 0.61 - 1.24 mg/dL 9.14  7.82  9.56   Sodium 135 - 145 mmol/L 138  140  135   Potassium 3.5 - 5.1 mmol/L 4.3  3.7  3.6   Chloride 98 - 111 mmol/L 106  109  104   CO2 22 - 32 mmol/L 26  26  24    Calcium 8.9 - 10.3 mg/dL 8.8  8.5  8.0    A/P: New onset atrial fibrillation Difficult rate control in the setting of hypotension Tolerating metoprolol succinate 100 daily Underwent TEE cardioversion today, converting to normal sinus rhythm Heparin infusion transition to Eliquis 5 twice daily We can hold digoxin given normal sinus rhythm, normal ejection fraction -Will need outpatient workup for sleep  apnea -Morbid obesity likely contributing to high risk of recurrent arrhythmia  Coronary disease, recent PCI to mid RCA July 2024 -Off Brilinta, has been loaded with Plavix, will continue Plavix 75 daily, aspirin on hold in the setting of Eliquis -Continue beta-blocker as above with aspirin and statin  Hyperlipidemia Crestor 40, Zetia 10 added Medication compliance recommended  Obstructive sleep apnea High suspicion for sleep apnea, outpatient study recommended Placing him at higher risk of recurrent arrhythmia   Signed: Dossie Arbour  M.D., Ph.D. [facility]

## 2023-06-22 NOTE — Plan of Care (Signed)

## 2023-06-22 NOTE — Transfer of Care (Signed)
Immediate Anesthesia Transfer of Care Note  Patient: Grant [name]  Procedure(s) Performed: TRANSESOPHAGEAL ECHOCARDIOGRAM (TEE) CARDIOVERSION  Patient Location: cardiac specials   Anesthesia Type:General  Level of Consciousness: awake, oriented, and drowsy  Airway & Oxygen Therapy: Patient Spontanous Breathing and Patient connected to nasal cannula oxygen  Post-op Assessment: Report given to RN and Post -op Vital signs reviewed and stable  Post vital signs: Reviewed and stable  Last Vitals:  Vitals Value Taken Time  BP 88/77 06/22/23 1314  Temp    Pulse 77 06/22/23 1314  Resp 19 06/22/23 1314  SpO2 99 % 06/22/23 1314    Last Pain:  Vitals:   06/22/23 1210  TempSrc: Oral  PainSc: 0-No pain      Patients Stated Pain Goal: 0 (06/21/23 2140)  Complications: No notable events documented.

## 2023-06-22 NOTE — Progress Notes (Signed)
[facility] Progress Note  Patient: Grant [name]    HKV:[id number]  DOA: 06/19/2023     Date of Service: the patient was seen and examined on 06/22/2023  Chief Complaint  Patient presents with   Shortness of Breath   Chest Pain   Brief hospital course: Grant [name] is a 47 y.o. male with medical history significant of CAD s/p PCI with DES to mid RCA (July 2024), morbid obesity with BMI of 45, hyperlipidemia, OSA, nephrolithiasis, who presents to the ED due to chest pain and shortness of breath which started in the morning on 11/23 and gradually getting worse and felt palpitations.  Mild chest pain very short episode which resolved and did not recur.  Not any other complaints. Per chart review, patient went to the ED on 11/12 for dizziness with nausea and vomiting. EKG and telemetry at that time demonstrated sinus rhythm with multiple PVCs   ED Workup: BUN 19/66, HR 116, 100% on RA, afebrile. normal CBC, BMP with exception of glucose of 113, and troponin negative x 2. Chest x-ray with no active disease. EKG obtained with new onset atrial fibrillation with RVR. Heart rate was not able to be controlled with bolus of diltiazem. He was started on diltiazem infusion and [facility] contacted for admission.  Cardiology consulted, further management as below.  Assessment and Plan:  # A-fib with RVR Continue to monitor on telemetry CHA2DS2-VASc score 1 S/p Cardizem IV and s/p Cardizem IV infusion, s/p heparin IV infusion  S/p Lopressor 25 mg p.o. every 6 hourly due to soft blood pressure S/p Digoxin 0.25 mg IV every 2 hourly x 2 doses followed by 0.25 mg p.o. daily 11/26 started Eliquis 5 mg p.o. twice daily, Plavix 300 loading followed by 75 mg p.o. daily, discontinued Brilinta.  Started Toprol-XL 100 mg p.o. daily and digoxin 0.25 mg p.o. daily Neurology consulted, patient is scheduled for TEE and cardioversion today Follow cardiology for disposition plan  # CAD s/p stent S/p aspirin and  Brilinta, transition to Plavix and Eliquis twice daily on 11/26 Chest pain resolved most likely due to demand ischemia, troponin negative  OSA, follow with PCP for sleep study as an outpatient Morbid obesity Body mass index is 45.52 kg/m.  Interventions: Calorie restricted diet and daily exercise advised to lose body weight.  Lifestyle modification discussed.  Diet: Heart healthy diet DVT Prophylaxis: Therapeutic Anticoagulation with heparin IV infusion    Advance goals of care discussion: Full code  Family Communication: family was present at bedside, at the time of interview.  The pt provided permission to discuss medical plan with the family. Opportunity was given to ask question and all questions were answered satisfactorily.   Disposition:  Pt is from home, admitted with A-fib with RVR, still has A-fib RVR, s/p IV Cardizem infusion and IV heparin infusion, scheduled for TEE and cardioversion today.  Which precludes a safe discharge. Discharge to home, when stable, when cleared by cardiology.  Most likely today evening versus tomorrow a.m.  Subjective: No significant events overnight, denies any chest pain or pressure, no nasal complaints. Patient was awaiting for TEE and cardioversion today Patient would like to go home if he is cleared by cardiology in the evening.   Physical Exam: General: NAD, lying comfortably Appear in no distress, affect appropriate Eyes: PERRLA ENT: Oral Mucosa Clear, moist  Neck: no JVD,  Cardiovascular: irregular rhythm, no Murmur,  Respiratory: good respiratory effort, Bilateral Air entry equal and Decreased, no Crackles, no wheezes Abdomen:  Bowel Sound present, Soft and no tenderness,  Skin: no rashes Extremities: no Pedal edema, no calf tenderness Neurologic: without any new focal findings Gait not checked due to patient safety concerns  Vitals:   06/22/23 1315 06/22/23 1330 06/22/23 1345 06/22/23 1400  BP: (!) 77/36 (!) 83/67 (!) 91/50  91/63  Pulse: 79 79 77 79  Resp: 18 18 15 16   Temp:      TempSrc:      SpO2: 99% 95% 95% 94%  Weight:      Height:        Intake/Output Summary (Last 24 hours) at 06/22/2023 1410 Last data filed at 06/22/2023 1308 Gross per 24 hour  Intake 250 ml  Output 100 ml  Net 150 ml   Filed Weights   06/19/23 1150 06/19/23 1209  Weight: (!) 156 kg (!) 156.5 kg    Data Reviewed: I have personally reviewed and interpreted daily labs, tele strips, imagings as discussed above. I reviewed all nursing notes, pharmacy notes, vitals, pertinent old records I have discussed plan of care as described above with RN and patient/family.  CBC: Recent Labs  Lab 06/19/23 1211 06/21/23 0215 06/22/23 0445  WBC 7.9 8.0 9.2  HGB 15.7 15.0 15.5  HCT 46.4 45.5 46.6  MCV 83.2 84.9 84.1  PLT 236 200 218   Basic Metabolic Panel: Recent Labs  Lab 06/19/23 1211 06/20/23 0443 06/21/23 0215 06/22/23 0445  NA 137 135 140 138  K 4.3 3.6 3.7 4.3  CL 105 104 109 106  CO2 25 24 26 26   GLUCOSE 113* 120* 112* 96  BUN 11 11 10 13   CREATININE 0.96 0.91 0.80 0.89  CALCIUM 8.9 8.0* 8.5* 8.8*  MG  --  2.1 2.2 2.2    Studies: ECHOCARDIOGRAM COMPLETE  Result Date: 06/22/2023    ECHOCARDIOGRAM REPORT   Patient Name:   Grant [name] Date of Exam: 06/21/2023 Medical Rec #:  [id number]      Height:       73.0 in Accession #:    [id number]     Weight:       345.0 lb Date of Birth:  29-Aug-1975      BSA:          2.713 m Patient Age:    47 years       BP:           102/53 mmHg Patient Gender: M              HR:           102 bpm. Exam Location:  [facility] Procedure: 2D Echo, Cardiac Doppler, Color Doppler and Intracardiac            Opacification Agent Indications:     I48.91 Atrial Fibrillation  History:         Patient has prior history of Echocardiogram examinations, most                  recent 02/11/2023.  Sonographer:     Daphine Deutscher RDCS Referring Phys:  [id number] Gillis Santa Diagnosing Phys: Julien Nordmann MD IMPRESSIONS  1. Left ventricular ejection fraction, by estimation, is 55 to 60%. The left ventricle has normal function. The left ventricle has no regional wall motion abnormalities. There is mild left ventricular hypertrophy. Left ventricular diastolic parameters are indeterminate.  2. Right ventricular systolic function is normal. The right ventricular size is normal.  3. The mitral valve is normal in structure. Mild mitral  valve regurgitation. No evidence of mitral stenosis.  4. The aortic valve is normal in structure. Aortic valve regurgitation is not visualized. No aortic stenosis is present.  5. There is mild dilatation of the aortic root, measuring 43 mm.  6. The inferior vena cava is normal in size with greater than 50% respiratory variability, suggesting right atrial pressure of 3 mmHg. FINDINGS  Left Ventricle: Left ventricular ejection fraction, by estimation, is 55 to 60%. The left ventricle has normal function. The left ventricle has no regional wall motion abnormalities. Definity contrast agent was given IV to delineate the left ventricular  endocardial borders. The left ventricular internal cavity size was normal in size. There is mild left ventricular hypertrophy. Left ventricular diastolic parameters are indeterminate. Right Ventricle: The right ventricular size is normal. No increase in right ventricular wall thickness. Right ventricular systolic function is normal. Left Atrium: Left atrial size was normal in size. Right Atrium: Right atrial size was normal in size. Pericardium: There is no evidence of pericardial effusion. Mitral Valve: The mitral valve is normal in structure. Mild mitral valve regurgitation. No evidence of mitral valve stenosis. Tricuspid Valve: The tricuspid valve is normal in structure. Tricuspid valve regurgitation is mild . No evidence of tricuspid stenosis. Aortic Valve: The aortic valve is normal in structure. Aortic valve regurgitation is not visualized. No  aortic stenosis is present. Pulmonic Valve: The pulmonic valve was normal in structure. Pulmonic valve regurgitation is not visualized. No evidence of pulmonic stenosis. Aorta: The aortic root is normal in size and structure. There is mild dilatation of the aortic root, measuring 43 mm. Venous: The inferior vena cava is normal in size with greater than 50% respiratory variability, suggesting right atrial pressure of 3 mmHg. IAS/Shunts: No atrial level shunt detected by color flow Doppler.  LEFT VENTRICLE PLAX 2D LVIDd:         5.70 cm LVIDs:         3.30 cm LV PW:         1.40 cm LV IVS:        1.20 cm LVOT diam:     2.50 cm LV SV:         85 LV SV Index:   31 LVOT Area:     4.91 cm  RIGHT VENTRICLE RV Basal diam:  4.90 cm RV S prime:     12.57 cm/s TAPSE (M-mode): 2.0 cm LEFT ATRIUM             Index        RIGHT ATRIUM           Index LA diam:        5.20 cm 1.92 cm/m   RA Area:     15.30 cm LA Vol (A2C):   68.9 ml 25.40 ml/m  RA Volume:   38.60 ml  14.23 ml/m LA Vol (A4C):   43.3 ml 15.96 ml/m LA Biplane Vol: 59.4 ml 21.90 ml/m  AORTIC VALVE LVOT Vmax:   91.62 cm/s LVOT Vmean:  65.880 cm/s LVOT VTI:    0.172 m  AORTA Ao Root diam: 4.30 cm Ao Asc diam:  3.60 cm MV E velocity: 89.92 cm/s  TRICUSPID VALVE                            TR Peak grad:   17.5 mmHg  TR Vmax:        209.00 cm/s                             SHUNTS                            Systemic VTI:  0.17 m                            Systemic Diam: 2.50 cm Julien Nordmann MD Electronically signed by Julien Nordmann MD Signature Date/Time: 06/22/2023/7:45:16 AM    Final     Scheduled Meds:  [MAR Hold] apixaban  5 mg Oral BID   [MAR Hold] clopidogrel  75 mg Oral Daily   [MAR Hold] digoxin  0.25 mg Oral Daily   ezetimibe  10 mg Oral Daily   [MAR Hold] metoprolol succinate  100 mg Oral Daily   [MAR Hold] pantoprazole  40 mg Oral Daily   [MAR Hold] rosuvastatin  40 mg Oral Daily   sodium chloride flush  10 mL Intravenous  Q12H   Continuous Infusions:   PRN Meds: [MAR Hold] acetaminophen, [MAR Hold] ALPRAZolam, fentaNYL (SUBLIMAZE) injection, [MAR Hold] ondansetron (ZOFRAN) IV, ondansetron (ZOFRAN) IV, oxyCODONE **OR** oxyCODONE  Time spent: 35 minutes  Author: Gillis Santa. MD [facility] 06/22/2023 2:10 PM  To reach On-call, see care teams to locate the attending and reach out to them via [email or web address]. If 7PM-7AM, please contact night-coverage If you still have difficulty reaching the attending provider, please page the [facility] (Director on Call) for [facility] on amion for assistance.

## 2023-06-22 NOTE — Anesthesia Postprocedure Evaluation (Signed)
Anesthesia Post Note  Patient: Grant [name]  Procedure(s) Performed: TRANSESOPHAGEAL ECHOCARDIOGRAM (TEE) CARDIOVERSION  Patient location during evaluation: Phase II Anesthesia Type: General Level of consciousness: awake and awake and alert Pain management: pain level controlled Vital Signs Assessment: post-procedure vital signs reviewed and stable Respiratory status: spontaneous breathing Cardiovascular status: stable Anesthetic complications: no   No notable events documented.   Last Vitals:  Vitals:   06/22/23 1314 06/22/23 1315  BP: (!) 88/77 (!) 77/36  Pulse: 77 79  Resp: 19 18  Temp:    SpO2: 99% 99%    Last Pain:  Vitals:   06/22/23 1315  TempSrc:   PainSc: 0-No pain                 VAN STAVEREN,Satin Boal

## 2023-06-22 NOTE — Progress Notes (Addendum)
   Patient Name: Grant [name] Date of Encounter: 06/22/2023 [facility] Cardiologist: Thurmon Fair, MD   Interval Summary  .    Patient seen on AM rounds. Denies any chest pain or shortness of breath. Remains in atrial fibrillation on telemetry. Scheduled for TEE/DCCV this afternoon.   Vital Signs .    Vitals:   06/22/23 0748 06/22/23 0933 06/22/23 0936 06/22/23 1136  BP: (!) 109/54 (!) 117/97 (!) 117/97 105/64  Pulse: 99  (!) 113 (!) 49  Resp: 16     Temp: 97.6 F (36.4 C)   98 F (36.7 C)  TempSrc: Oral   Oral  SpO2: 96% 98%  97%  Weight:      Height:        Intake/Output Summary (Last 24 hours) at 06/22/2023 1201 Last data filed at 06/22/2023 0555 Gross per 24 hour  Intake --  Output 100 ml  Net -100 ml      06/19/2023   12:09 PM 06/19/2023   11:50 AM 06/07/2023    8:10 AM  Last 3 Weights  Weight (lbs) 345 lb 343 lb 14.7 oz 345 lb  Weight (kg) 156.491 kg 156 kg 156.491 kg      Telemetry/ECG    Rate controlled atrial fibrillation rates 100-110bpm - Personally Reviewed  Physical Exam .   GEN: No acute distress.   Neck: No JVD Cardiac: IR IR, no murmurs, rubs, or gallops.  Respiratory: Clear to auscultation bilaterally. GI: Soft, nontender, non-distended  MS: No edema  Assessment & Plan .     New onset atrial fibrillation -Presented with shortness of breath and chest pain found to be in atrial fibrillation RVR started on IV to hold but later had to be stopped due to low blood pressures -Continued on telemetry monitoring -Currently remains in rate controlled atrial fibrillation rate of 100 210 bpm -Continue metoprolol XL 100 mg daily -CHA2DS2-VASc score of 1, originally was on heparin infusion that was transitioned to apixaban 5 mg twice daily -Keep magnesium greater than 2 potassium greater than 4 -TSH within normal limits -Continued on dig 0.25 mg daily, dig level ordered for 11/29 -schedule for a TEE/DCCV this afternoon  CAD status  post PCI/DES mRCA 01/2023 -Chest pain in the setting of rapid atrial fibrillation -Chest pain is now resolved -High-sensitivity troponin negative -Reports compliance with DAPT -Continue statin, beta-blocker, aspirin, clopidogrel -Echocardiogram completed revealed LVEF 55 to 60%, mild left ventricular hypertrophy, mild mitral regurgitation, mild dilatation of the aortic root measuring 43 mm  Hyperlipidemia -LDL 117 -Continued on rosuvastatin 40 mg daily -Started on Zetia 10 mg daily -Consider PCSK9 inhibitor as outpatient -Repeat lipid and LFT in 8 to 10 weeks  OSA -Patient continues to be reluctant to undergo sleep study testing  For questions or updates, please contact [facility] Please consult [email or web address] for contact info under        Signed, Casee Knepp, NP

## 2023-06-22 NOTE — Progress Notes (Signed)
*  PRELIMINARY RESULTS* Echocardiogram Echocardiogram Transesophageal has been performed.  Cristela Blue 06/22/2023, 1:06 PM

## 2023-06-22 NOTE — CV Procedure (Signed)
Cardioversion procedure note For atrial fibrillation with RVR.  Procedure Details:  Consent: Risks of procedure as well as the alternatives and risks of each were explained to the (patient/caregiver).  Consent for procedure obtained.  Time Out: Verified patient identification, verified procedure, site/side was marked, verified correct patient position, special equipment/implants available, medications/allergies/relevent history reviewed, required imaging and test results available.  Performed  Patient placed on cardiac monitor, pulse oximetry, supplemental oxygen as necessary.   Sedation given: propofol IV, Dr. Darleene Cleaver Pacer pads placed anterior and posterior chest.   Cardioverted 1 time(s).   Cardioverted at  200 J. Synchronized biphasic Converted to NSR   Evaluation: Findings: Post procedure EKG shows: NSR Complications: None Patient did tolerate procedure well.  Time Spent Directly with the Patient:  45 minutes   Dossie Arbour, M.D., Ph.D.

## 2023-06-22 NOTE — Progress Notes (Signed)
Transesophageal Echocardiogram :  Indication: Atrial fibrillation with RVR Requesting/ordering  physician: Dr. Lucianne Muss  Procedure: Benzocaine spray x2 and 2 mls x 2 of viscous lidocaine were given orally to provide local anesthesia to the oropharynx. The patient was positioned supine on the left side, bite block provided. The patient was moderately sedated with the doses of versed and fentanyl as detailed below.  Using digital technique an omniplane probe was advanced into the distal esophagus without incident.   Moderate sedation: 1. Sedation used: Per anesthesia team, propofol given  See report in EPIC  for complete details: In brief, transgastric imaging revealed normal LV function with no RWMAs and no mural apical thrombus.  .  Estimated ejection fraction was 55%.  Right sided cardiac chambers were normal with no evidence of pulmonary hypertension.  Imaging of the septum showed no ASD or VSD Bubble study was negative for shunt 2D and color flow confirmed no PFO  The LA was well visualized in orthogonal views.  There was no spontaneous contrast and no thrombus in the LA and LA appendage   The descending thoracic aorta had no  mural aortic debris with no evidence of aneurysmal dilation or disection  Cardioversion to follow  Julien Nordmann 06/22/2023 3:33 PM

## 2023-06-23 ENCOUNTER — Telehealth: Payer: Self-pay | Admitting: Internal Medicine

## 2023-06-23 DIAGNOSIS — I2510 Atherosclerotic heart disease of native coronary artery without angina pectoris: Secondary | ICD-10-CM | POA: Diagnosis not present

## 2023-06-23 DIAGNOSIS — Z9861 Coronary angioplasty status: Secondary | ICD-10-CM | POA: Diagnosis not present

## 2023-06-23 DIAGNOSIS — I4891 Unspecified atrial fibrillation: Secondary | ICD-10-CM | POA: Diagnosis not present

## 2023-06-23 LAB — CBC
HCT: 45.3 % (ref 39.0–52.0)
Hemoglobin: 14.9 g/dL (ref 13.0–17.0)
MCH: 28 pg (ref 26.0–34.0)
MCHC: 32.9 g/dL (ref 30.0–36.0)
MCV: 85.2 fL (ref 80.0–100.0)
Platelets: 183 10*3/uL (ref 150–400)
RBC: 5.32 MIL/uL (ref 4.22–5.81)
RDW: 12.7 % (ref 11.5–15.5)
WBC: 8.5 10*3/uL (ref 4.0–10.5)
nRBC: 0 % (ref 0.0–0.2)

## 2023-06-23 LAB — BASIC METABOLIC PANEL
Anion gap: 9 (ref 5–15)
BUN: 13 mg/dL (ref 6–20)
CO2: 26 mmol/L (ref 22–32)
Calcium: 8.7 mg/dL — ABNORMAL LOW (ref 8.9–10.3)
Chloride: 102 mmol/L (ref 98–111)
Creatinine, Ser: 0.87 mg/dL (ref 0.61–1.24)
GFR, Estimated: >60 mL/min (ref >60)
Glucose, Bld: 100 mg/dL — ABNORMAL HIGH (ref 70–99)
Potassium: 4.1 mmol/L (ref 3.5–5.1)
Sodium: 137 mmol/L (ref 135–145)

## 2023-06-23 LAB — MAGNESIUM: Magnesium: 2.4 mg/dL (ref 1.7–2.4)

## 2023-06-23 LAB — ECHO TEE

## 2023-06-23 MED ORDER — APIXABAN 5 MG PO TABS: 5.0000 mg | ORAL_TABLET | Freq: Two times a day (BID) | ORAL | Status: DC

## 2023-06-23 MED ORDER — METOPROLOL SUCCINATE ER 25 MG PO TB24
25.0000 mg | ORAL_TABLET | Freq: Every day | ORAL | Status: DC
  Administered 2023-06-23: 25 mg via ORAL
  Filled 2023-06-23: qty 1

## 2023-06-23 MED ORDER — EZETIMIBE 10 MG PO TABS: 10.0000 mg | ORAL_TABLET | Freq: Every day | ORAL | 11 refills | Status: AC

## 2023-06-23 MED ORDER — PANTOPRAZOLE SODIUM 40 MG PO TBEC: 40.0000 mg | DELAYED_RELEASE_TABLET | Freq: Every day | ORAL | 11 refills | Status: AC

## 2023-06-23 MED ORDER — CLOPIDOGREL BISULFATE 75 MG PO TABS: 75.0000 mg | ORAL_TABLET | Freq: Every day | ORAL | 11 refills | Status: AC

## 2023-06-23 NOTE — Discharge Summary (Signed)
[facility] Discharge Summary   Patient: Grant [name] WUJ:[id number]  PCP: Patient, No Pcp Per  Date of admission: 06/19/2023   Date of discharge:  06/23/2023     Discharge Diagnoses:  Principal Problem:   Atrial fibrillation with RVR (HCC) Active Problems:   CAD S/P percutaneous coronary angioplasty   OSA (obstructive sleep apnea)   Obesity, Class III, BMI 40-49.9 (morbid obesity) (HCC)   SOB (shortness of breath)   Admitted From: Home Disposition:  Home   Recommendations for Outpatient Follow-up:  PCP: in 1 wk F/u Cardio in 1 wk Follow up LABS/TEST:     Follow-up Information     PCP Follow up in 1 week(s).          Croitoru, Mihai, MD Follow up in 1 week(s).   Specialty: Cardiology Contact information: [address] [place] [address] [phone]                Diet recommendation: Cardiac diet  Activity: The patient is advised to gradually reintroduce usual activities, as tolerated  Discharge Condition: stable  Code Status: Full code   History of present illness: As per the H and P dictated on admission Hospital Course:  Grant [name] is a 47 y.o. male with medical history significant of CAD s/p PCI with DES to mid RCA (July 2024), morbid obesity with BMI of 45, hyperlipidemia, OSA, nephrolithiasis, who presents to the ED due to chest pain and shortness of breath which started in the morning on 11/23 and gradually getting worse and felt palpitations.  Mild chest pain very short episode which resolved and did not recur.  Not any other complaints. Per chart review, patient went to the ED on 11/12 for dizziness with nausea and vomiting. EKG and telemetry at that time demonstrated sinus rhythm with multiple PVCs    ED Workup: BUN 19/66, HR 116, 100% on RA, afebrile. normal CBC, BMP with exception of glucose of 113, and troponin negative x 2. Chest x-ray with no active disease. EKG obtained with new onset atrial fibrillation with  RVR. Heart rate was not able to be controlled with bolus of diltiazem. He was started on diltiazem infusion and [facility] contacted for admission.  Cardiology consulted, further management as below.   Assessment and Plan:   # A-fib with RVR, s/p ECV no in SR CHA2DS2-VASc score 1 S/p Cardizem IV and s/p Cardizem IV infusion, s/p heparin IV infusion  S/p Lopressor 25 mg p.o. every 6 hourly due to soft blood pressure S/p Digoxin 0.25 mg IV every 2 hourly x 2 doses followed by 0.25 mg p.o. daily. On 11/26 started Eliquis 5 mg p.o. twice daily, Plavix 300 loading followed by 75 mg p.o. daily, discontinued Brilinta.  Started Toprol-XL 100 mg p.o. daily and digoxin 0.25 mg p.o. daily.  cardiology consulted, s/p TEE and cardioversion done on 11/27, now pt is in SR and feels no symptoms.  Digoxin discontinued, decreased Toprol-XL 25 p.o. daily home dose.  Patient was cleared by cardiology to discharge and follow-up outpatient with  # CAD s/p stent S/p aspirin and Brilinta, transition to Plavix and Eliquis twice daily on 11/26 Chest pain resolved most likely due to demand ischemia, troponin negative   OSA, follow with PCP for sleep study as an outpatient Morbid obesity Body mass index is 45.52 kg/m.  Interventions: Calorie restricted diet and daily exercise advised to lose body weight.  Lifestyle modification discussed.    Patient was ambulatory without any assistance. On  the day of the discharge the patient's vitals were stable, and no other acute medical condition were reported by patient. the patient was felt safe to be discharge at Home.  Consultants: Cardiology Procedures: s/p TEE and ECV  Discharge Exam: General: Appear in no distress, no Rash; Oral Mucosa Clear, moist. Cardiovascular: S1 and S2 Present, no Murmur, Respiratory: normal respiratory effort, Bilateral Air entry present and no Crackles, no wheezes Abdomen: Bowel Sound present, Soft and no tenderness, no hernia Extremities: no  Pedal edema, no calf tenderness Neurology: alert and oriented to time, place, and person affect appropriate.  Filed Weights   06/19/23 1150 06/19/23 1209  Weight: (!) 156 kg (!) 156.5 kg   Vitals:   06/23/23 0511 06/23/23 0719  BP: (!) 109/41 96/66  Pulse: 73 79  Resp: 18 18  Temp: 97.6 F (36.4 C) 98 F (36.7 C)  SpO2: 98% 96%    DISCHARGE MEDICATION: Allergies as of 06/23/2023   No Known Allergies      Medication List     STOP taking these medications    aspirin EC 81 MG tablet   omeprazole 40 MG capsule Commonly known as: PRILOSEC Replaced by: pantoprazole 40 MG tablet   oxyCODONE 5 MG immediate release tablet Commonly known as: Roxicodone   ticagrelor 90 MG Tabs tablet Commonly known as: BRILINTA       TAKE these medications    apixaban 5 MG Tabs tablet Commonly known as: Eliquis Take 1 tablet (5 mg total) by mouth 2 (two) times daily.   clopidogrel 75 MG tablet Commonly known as: PLAVIX Take 1 tablet (75 mg total) by mouth daily. Start taking on: June 24, 2023   ezetimibe 10 MG tablet Commonly known as: ZETIA Take 1 tablet (10 mg total) by mouth daily. Start taking on: June 24, 2023   metoprolol succinate 25 MG 24 hr tablet Commonly known as: TOPROL-XL Take 1 tablet (25 mg total) by mouth daily.   ondansetron 8 MG disintegrating tablet Commonly known as: ZOFRAN-ODT Take 1 tablet (8 mg total) by mouth every 8 (eight) hours as needed for nausea.   pantoprazole 40 MG tablet Commonly known as: PROTONIX Take 1 tablet (40 mg total) by mouth daily. Start taking on: June 24, 2023 Replaces: omeprazole 40 MG capsule   rosuvastatin 40 MG tablet Commonly known as: CRESTOR Take 1 tablet (40 mg total) by mouth daily.       No Known Allergies Discharge Instructions     Call MD for:   Complete by: As directed    Chest Pain or Palpitations   Call MD for:  difficulty breathing, headache or visual disturbances   Complete by: As  directed    Call MD for:  extreme fatigue   Complete by: As directed    Call MD for:  persistant dizziness or light-headedness   Complete by: As directed    Call MD for:  persistant nausea and vomiting   Complete by: As directed    Call MD for:  severe uncontrolled pain   Complete by: As directed    Call MD for:  temperature >100.4   Complete by: As directed    Diet - low sodium heart healthy   Complete by: As directed    Discharge instructions   Complete by: As directed    F/u with PCP in 1 wk F/u with cardio in 1 wk   Increase activity slowly   Complete by: As directed  The results of significant diagnostics from this hospitalization (including imaging, microbiology, ancillary and laboratory) are listed below for reference.    Significant Diagnostic Studies: ECHO TEE  Result Date: 06/23/2023    TRANSESOPHOGEAL ECHO REPORT   Patient Name:   Grant [name] Date of Exam: 06/22/2023 Medical Rec #:  [id number]      Height:       73.0 in Accession #:    [id number]     Weight:       345.0 lb Date of Birth:  03/18/1976      BSA:          2.713 m Patient Age:    47 years       BP:           102/69 mmHg Patient Gender: M              HR:           85 bpm. Exam Location:  [facility] Procedure: Transesophageal Echo, Cardiac Doppler, Color Doppler and Saline            Contrast Bubble Study Indications:     Atrial Fibrillation I48.91  History:         Patient has prior history of Echocardiogram examinations, most                  recent 06/22/2023. Risk Factors:Diabetes and Hypertension.  Sonographer:     Cristela Blue Referring Phys:  [id number] Antonieta Iba Diagnosing Phys: Julien Nordmann MD PROCEDURE: After discussion of the risks and benefits of a TEE, an informed consent was obtained from the patient. TEE procedure time was 30 minutes. The transesophogeal probe was passed without difficulty through the esophogus of the patient. Local oropharyngeal anesthetic was provided with Cetacaine and viscous  lidocaine. Sedation performed by different physician. Image quality was excellent. The patient's vital signs; including heart rate, blood pressure, and oxygen saturation; remained stable throughout the procedure. The patient developed no complications during the procedure. A successful direct current cardioversion was performed at 200 joules with 1 attempt.  IMPRESSIONS  1. Left ventricular ejection fraction, by estimation, is 45 to 50%. The left ventricle has mildly decreased function. The left ventricle demonstrates global hypokinesis.  2. Right ventricular systolic function is mildly reduced. The right ventricular size is normal.  3. The mitral valve is normal in structure. No evidence of mitral valve regurgitation. No evidence of mitral stenosis.  4. The aortic valve is tricuspid. Aortic valve regurgitation is not visualized. No aortic stenosis is present.  5. The inferior vena cava is normal in size with greater than 50% respiratory variability, suggesting right atrial pressure of 3 mmHg.  6. rhythm is atrial fibrillation with RVR  7. Left atrial size was moderately dilated. No left atrial/left atrial appendage thrombus was detected.  8. Agitated saline contrast bubble study was positive with shunting observed within 3-6 cardiac cycles suggestive of interatrial shunt. There is a small patent foramen ovale with predominantly right to left shunting across the atrial septum. Conclusion(s)/Recommendation(s): Normal biventricular function without evidence of hemodynamically significant valvular heart disease. FINDINGS  Left Ventricle: Left ventricular ejection fraction, by estimation, is 45 to 50%. The left ventricle has mildly decreased function. The left ventricle demonstrates global hypokinesis. The left ventricular internal cavity size was normal in size. There is  no left ventricular hypertrophy. Right Ventricle: The right ventricular size is normal. No increase in right ventricular wall thickness. Right  ventricular systolic function is mildly reduced.  Left Atrium: Left atrial size was moderately dilated. No left atrial/left atrial appendage thrombus was detected. Right Atrium: Right atrial size was normal in size. Pericardium: There is no evidence of pericardial effusion. Mitral Valve: The mitral valve is normal in structure. No evidence of mitral valve regurgitation. No evidence of mitral valve stenosis. Tricuspid Valve: The tricuspid valve is normal in structure. Tricuspid valve regurgitation is mild . No evidence of tricuspid stenosis. Aortic Valve: The aortic valve is tricuspid. Aortic valve regurgitation is not visualized. No aortic stenosis is present. Pulmonic Valve: The pulmonic valve was normal in structure. Pulmonic valve regurgitation is not visualized. No evidence of pulmonic stenosis. Aorta: The aortic root is normal in size and structure. Venous: The inferior vena cava is normal in size with greater than 50% respiratory variability, suggesting right atrial pressure of 3 mmHg. IAS/Shunts: No atrial level shunt detected by color flow Doppler. Agitated saline contrast was given intravenously to evaluate for intracardiac shunting. Agitated saline contrast bubble study was positive with shunting observed within 3-6 cardiac cycles suggestive of interatrial shunt. A small patent foramen ovale is detected with predominantly right to left shunting across the atrial septum. Julien Nordmann MD Electronically signed by Julien Nordmann MD Signature Date/Time: 06/23/2023/10:31:00 AM    Final    ECHOCARDIOGRAM COMPLETE  Result Date: 06/22/2023    ECHOCARDIOGRAM REPORT   Patient Name:   Grant [name] Date of Exam: 06/21/2023 Medical Rec #:  [id number]      Height:       73.0 in Accession #:    [id number]     Weight:       345.0 lb Date of Birth:  June 13, 1976      BSA:          2.713 m Patient Age:    47 years       BP:           102/53 mmHg Patient Gender: M              HR:           102 bpm. Exam Location:  [facility]  Procedure: 2D Echo, Cardiac Doppler, Color Doppler and Intracardiac            Opacification Agent Indications:     I48.91 Atrial Fibrillation  History:         Patient has prior history of Echocardiogram examinations, most                  recent 02/11/2023.  Sonographer:     Daphine Deutscher RDCS Referring Phys:  [id number] Gillis Santa Diagnosing Phys: Julien Nordmann MD IMPRESSIONS  1. Left ventricular ejection fraction, by estimation, is 55 to 60%. The left ventricle has normal function. The left ventricle has no regional wall motion abnormalities. There is mild left ventricular hypertrophy. Left ventricular diastolic parameters are indeterminate.  2. Right ventricular systolic function is normal. The right ventricular size is normal.  3. The mitral valve is normal in structure. Mild mitral valve regurgitation. No evidence of mitral stenosis.  4. The aortic valve is normal in structure. Aortic valve regurgitation is not visualized. No aortic stenosis is present.  5. There is mild dilatation of the aortic root, measuring 43 mm.  6. The inferior vena cava is normal in size with greater than 50% respiratory variability, suggesting right atrial pressure of 3 mmHg. FINDINGS  Left Ventricle: Left ventricular ejection fraction, by estimation, is 55 to 60%. The left ventricle has normal function. The  left ventricle has no regional wall motion abnormalities. Definity contrast agent was given IV to delineate the left ventricular  endocardial borders. The left ventricular internal cavity size was normal in size. There is mild left ventricular hypertrophy. Left ventricular diastolic parameters are indeterminate. Right Ventricle: The right ventricular size is normal. No increase in right ventricular wall thickness. Right ventricular systolic function is normal. Left Atrium: Left atrial size was normal in size. Right Atrium: Right atrial size was normal in size. Pericardium: There is no evidence of pericardial effusion.  Mitral Valve: The mitral valve is normal in structure. Mild mitral valve regurgitation. No evidence of mitral valve stenosis. Tricuspid Valve: The tricuspid valve is normal in structure. Tricuspid valve regurgitation is mild . No evidence of tricuspid stenosis. Aortic Valve: The aortic valve is normal in structure. Aortic valve regurgitation is not visualized. No aortic stenosis is present. Pulmonic Valve: The pulmonic valve was normal in structure. Pulmonic valve regurgitation is not visualized. No evidence of pulmonic stenosis. Aorta: The aortic root is normal in size and structure. There is mild dilatation of the aortic root, measuring 43 mm. Venous: The inferior vena cava is normal in size with greater than 50% respiratory variability, suggesting right atrial pressure of 3 mmHg. IAS/Shunts: No atrial level shunt detected by color flow Doppler.  LEFT VENTRICLE PLAX 2D LVIDd:         5.70 cm LVIDs:         3.30 cm LV PW:         1.40 cm LV IVS:        1.20 cm LVOT diam:     2.50 cm LV SV:         85 LV SV Index:   31 LVOT Area:     4.91 cm  RIGHT VENTRICLE RV Basal diam:  4.90 cm RV S prime:     12.57 cm/s TAPSE (M-mode): 2.0 cm LEFT ATRIUM             Index        RIGHT ATRIUM           Index LA diam:        5.20 cm 1.92 cm/m   RA Area:     15.30 cm LA Vol (A2C):   68.9 ml 25.40 ml/m  RA Volume:   38.60 ml  14.23 ml/m LA Vol (A4C):   43.3 ml 15.96 ml/m LA Biplane Vol: 59.4 ml 21.90 ml/m  AORTIC VALVE LVOT Vmax:   91.62 cm/s LVOT Vmean:  65.880 cm/s LVOT VTI:    0.172 m  AORTA Ao Root diam: 4.30 cm Ao Asc diam:  3.60 cm MV E velocity: 89.92 cm/s  TRICUSPID VALVE                            TR Peak grad:   17.5 mmHg                            TR Vmax:        209.00 cm/s                             SHUNTS                            Systemic VTI:  0.17 m  Systemic Diam: 2.50 cm Julien Nordmann MD Electronically signed by Julien Nordmann MD Signature Date/Time: 06/22/2023/7:45:16 AM     Final    DG Chest 2 View  Result Date: 06/19/2023 CLINICAL DATA:  cp/sob EXAM: CHEST - 2 VIEW COMPARISON:  June 07, 2023 FINDINGS: The cardiomediastinal silhouette is unchanged in contour. No pleural effusion. No pneumothorax. No acute pleuroparenchymal abnormality. Visualized abdomen is unremarkable. Multilevel degenerative changes of the thoracic spine. IMPRESSION: No acute cardiopulmonary abnormality. Electronically Signed   By: Meda Klinefelter M.D.   On: 06/19/2023 12:51   DG Chest Port 1 View  Result Date: 06/07/2023 CLINICAL DATA:  Chest pain. EXAM: PORTABLE CHEST 1 VIEW COMPARISON:  02/10/2023. FINDINGS: Bilateral lung fields are clear. Bilateral costophrenic angles are clear. Normal cardio-mediastinal silhouette. No acute osseous abnormalities. The soft tissues are within normal limits. IMPRESSION: *No active disease. Electronically Signed   By: Jules Schick M.D.   On: 06/07/2023 09:29    Microbiology: No results found for this or any previous visit (from the past 240 hour(s)).   Labs: CBC: Recent Labs  Lab 06/19/23 1211 06/21/23 0215 06/22/23 0445 06/23/23 0455  WBC 7.9 8.0 9.2 8.5  HGB 15.7 15.0 15.5 14.9  HCT 46.4 45.5 46.6 45.3  MCV 83.2 84.9 84.1 85.2  PLT 236 200 218 183   Basic Metabolic Panel: Recent Labs  Lab 06/19/23 1211 06/20/23 0443 06/21/23 0215 06/22/23 0445 06/23/23 0455  NA 137 135 140 138 137  K 4.3 3.6 3.7 4.3 4.1  CL 105 104 109 106 102  CO2 25 24 26 26 26   GLUCOSE 113* 120* 112* 96 100*  BUN 11 11 10 13 13   CREATININE 0.96 0.91 0.80 0.89 0.87  CALCIUM 8.9 8.0* 8.5* 8.8* 8.7*  MG  --  2.1 2.2 2.2 2.4   Liver Function Tests: No results for input(s): "AST", "ALT", "ALKPHOS", "BILITOT", "PROT", "ALBUMIN" in the last 168 hours. No results for input(s): "LIPASE", "AMYLASE" in the last 168 hours. No results for input(s): "AMMONIA" in the last 168 hours. Cardiac Enzymes: No results for input(s): "CKTOTAL", "CKMB", "CKMBINDEX",  "TROPONINI" in the last 168 hours. BNP (last 3 results) Recent Labs    02/10/23 0728  BNP 5.2   CBG: No results for input(s): "GLUCAP" in the last 168 hours.  Time spent: 35 minutes  Signed:  Gillis Santa  [facility] 06/23/2023 10:59 AM

## 2023-06-23 NOTE — Telephone Encounter (Signed)
Message received from Dr. Okey Dupre advising the patient will be discharged today from [facility] and needed Eliquis samples.   Samples & free 30-day trial coupon pulled for the patient and given to Dr. Okey Dupre. He will deliver these to the patient at the hospital prior to discharge.   Eliquis 5 mg Lot: [id number] Exp: 09/23/24 # 1 box given

## 2023-06-23 NOTE — Progress Notes (Signed)
Approximately 1240-- Pt discharged to home. At time of discharge, pt A&O x 4 and VSS. All PIVs removed. AVS discharge instructions provided to pt by this RN with all questions answered at this time. Telemetry removed and notified.

## 2023-06-23 NOTE — Progress Notes (Signed)
   Patient Name: Grant [name] Date of Encounter: 06/23/2023 [facility] Cardiologist: Thurmon Fair, MD   Interval Summary  .    Patient underwent successful TEE-guided cardioversion yesterday and has maintained sinus rhythm.  He feels notably better with less racing of the heart.  He denies chest pain, shortness of breath, and lightheadedness.  Vital Signs .    Vitals:   06/22/23 1934 06/22/23 2323 06/23/23 0511 06/23/23 0719  BP: (!) 110/55 (!) 105/56 (!) 109/41 96/66  Pulse: 88 84 73 79  Resp: 18 18 18 18   Temp: 97.8 F (36.6 C) 97.8 F (36.6 C) 97.6 F (36.4 C) 98 F (36.7 C)  TempSrc: Oral Oral Oral   SpO2: 98% 97% 98% 96%  Weight:      Height:        Intake/Output Summary (Last 24 hours) at 06/23/2023 1044 Last data filed at 06/23/2023 0000 Gross per 24 hour  Intake 610 ml  Output 0 ml  Net 610 ml      06/19/2023   12:09 PM 06/19/2023   11:50 AM 06/07/2023    8:10 AM  Last 3 Weights  Weight (lbs) 345 lb 343 lb 14.7 oz 345 lb  Weight (kg) 156.491 kg 156 kg 156.491 kg      Telemetry/ECG    Normal sinus rhythm - Personally Reviewed  Physical Exam .   GEN: No acute distress.   Neck: Unable to assess JVP due to body habitus. Cardiac: RRR, no murmurs, rubs, or gallops.  Respiratory: Clear to auscultation bilaterally. GI: Soft, nontender, non-distended  MS: Trace pretibial edema bilaterally.  Assessment & Plan .     New onset atrial fibrillation: After difficult to control ventricular rates, patient underwent successful TEE-guided cardioversion yesterday and is maintaining sinus rhythm.  Blood pressure remains soft. -Decrease metoprolol succinate to 25 mg daily in the setting of soft blood pressure. -Continue apixaban 5 mg twice daily for at least 1 month in the setting of a CHA2DS2-VASc score of at least 1. -Consider outpatient EP consultation to discuss antiarrhythmic therapy versus ablation, though body habitus may limit treatment  options.  Coronary artery disease: No angina reported.  Patient status post PCI to RCA in 01/2023 in the setting of unstable angina.  Given atrial fibrillation and need for anticoagulation with apixaban, he has been transition from ticagrelor to clopidogrel this admission. -Continue apixaban and clopidogrel.  If apixaban is discontinued prior to completing 12 months of dual agent therapy, aspirin should be resumed and 12 months of DAPT completed. -Continue rosuvastatin and ezetimibe for secondary prevention.  Disposition: Patient is stable for discharge home.  He has been provided with samples for apixaban as well as a 30-day discount card.  We will arrange for follow-up with Dr. Royann Shivers or an APP in 1 to 2 weeks.  For questions or updates, please contact [facility] Please consult [email or web address] for contact info under [facility].     Signed, Yvonne Kendall, MD

## 2023-06-25 ENCOUNTER — Encounter: Payer: Self-pay | Admitting: Cardiovascular Disease

## 2023-07-01 ENCOUNTER — Telehealth: Payer: Self-pay

## 2023-07-01 ENCOUNTER — Ambulatory Visit: Payer: BC Managed Care – PPO | Attending: Nurse Practitioner | Admitting: Nurse Practitioner

## 2023-07-01 ENCOUNTER — Encounter: Payer: Self-pay | Admitting: Nurse Practitioner

## 2023-07-01 VITALS — BP 132/82 | HR 62 | Ht 72.0 in | Wt 350.8 lb

## 2023-07-01 DIAGNOSIS — I2510 Atherosclerotic heart disease of native coronary artery without angina pectoris: Secondary | ICD-10-CM

## 2023-07-01 DIAGNOSIS — G4733 Obstructive sleep apnea (adult) (pediatric): Secondary | ICD-10-CM

## 2023-07-01 DIAGNOSIS — I4819 Other persistent atrial fibrillation: Secondary | ICD-10-CM | POA: Diagnosis not present

## 2023-07-01 DIAGNOSIS — E785 Hyperlipidemia, unspecified: Secondary | ICD-10-CM

## 2023-07-01 MED ORDER — APIXABAN 5 MG PO TABS: 5.0000 mg | ORAL_TABLET | Freq: Two times a day (BID) | ORAL | 11 refills | Status: DC

## 2023-07-01 NOTE — Patient Instructions (Signed)
Medication Instructions:  Your physician recommends that you continue on your current medications as directed. Please refer to the Current Medication list given to you today.  *If you need a refill on your cardiac medications before your next appointment, please call your pharmacy*   Lab Work: Your physician recommends that you return for lab work at your convenience.   Fasting lipid panel & LFTs  Testing/Procedures: NONE ordered at this time of appointment   Follow-Up: At [facility], you and your health needs are our priority.  As part of our continuing mission to provide you with exceptional heart care, we have created designated Provider Care Teams.  These Care Teams include your primary Cardiologist (physician) and Advanced Practice Providers (APPs -  Physician Assistants and Nurse Practitioners) who all work together to provide you with the care you need, when you need it.  We recommend signing up for the patient portal called "MyChart".  Sign up information is provided on this After Visit Summary.  MyChart is used to connect with patients for Virtual Visits (Telemedicine).  Patients are able to view lab/test results, encounter notes, upcoming appointments, etc.  Non-urgent messages can be sent to your provider as well.   To learn more about what you can do with MyChart, go to [email or web address].    Your next appointment:   3-4 month(s)  Provider:   Thurmon Fair, MD  or Bernadene Person, NP        Other Instructions Mayford Knife discussed at office visit

## 2023-07-01 NOTE — Progress Notes (Signed)
Office Visit    Patient Name: Grant [name] Date of Encounter: 07/01/2023  Primary Care Provider:  Patient, No Pcp Per Primary Cardiologist:  Grant Fair, MD  Chief Complaint    47 year old male with a history of CAD s/p PCI with DES-RCA in 01/2023, paroxysmal atrial fibrillation s/p TEE guided DCCV in 05/2023, mitral valve regurgitation, aortic root dilation, hyperlipidemia, suspected OSA, and obesity who presents for hospital follow-up related to atrial fibrillation.  Past Medical History    Past Medical History:  Diagnosis Date   Kidney stones    Past Surgical History:  Procedure Laterality Date   APPENDECTOMY     CARDIOVERSION N/A 06/22/2023   Procedure: CARDIOVERSION;  Surgeon: Grant Iba, MD;  Location: [facility] ORS;  Service: Cardiovascular;  Laterality: N/A;   CORONARY STENT INTERVENTION N/A 02/10/2023   Procedure: CORONARY STENT INTERVENTION;  Surgeon: Grant Lex, MD;  Location: [facility] INVASIVE CV [facility];  Service: Cardiovascular;  Laterality: N/A;   LEFT HEART CATH AND CORONARY ANGIOGRAPHY N/A 02/10/2023   Procedure: LEFT HEART CATH AND CORONARY ANGIOGRAPHY;  Surgeon: Grant Lex, MD;  Location: [facility] INVASIVE CV [facility];  Service: Cardiovascular;  Laterality: N/A;   TEE WITHOUT CARDIOVERSION N/A 06/22/2023   Procedure: TRANSESOPHAGEAL ECHOCARDIOGRAM (TEE);  Surgeon: Grant Iba, MD;  Location: [facility] ORS;  Service: Cardiovascular;  Laterality: N/A;   TONSILLECTOMY      Allergies  No Known Allergies   Labs/Other Studies Reviewed    The following studies were reviewed today:  Cardiac Studies & Procedures   CARDIAC CATHETERIZATION  CARDIAC CATHETERIZATION 02/10/2023  Narrative   Prox RCA lesion is 15% stenosed.   CULPRIT LESION mid RCA lesion is 90% stenosed. ->  Heavily fibrous focal concentric.  TIMI-3 flow   After scoring balloon angioplasty was performed using a BALLN SCOREFLEX 3.50X15, A drug-eluting stent was successfully placed using a STENT  ONYX FRONTIER 3.5X22.  The stent was then postdilated and tapered fashion from 4.1 to 4.0 mm.  Post intervention, there is a 0% residual stenosis. TIMI-3 flow post   Prox Cx to Mid Cx lesion is 50% stenosed.   Ost LAD to Prox LAD lesion is 15% stenosed.   The Left Ventricular systolic function is normal. The Left Ventricular Ejection Fraction is 55-65% by visual estimate.   LV end diastolic pressure is normal.   Successful complex ScoreFlex PTCA based angioplasty and DES PCI of CULPRIT LESION (mid RCA fibrous 90%) with Onyx Frontier DES 3.5 mm x 22 mm postdilated tapered fashion from 4.1 to 4.0 mm.   RECOMMENDATIONS Overnight monitoring with TR band removal and gentle hydration. Continue to titrate GDMT for CAD-PCI   In the absence of any other complications or medical issues, we expect the patient to be ready for discharge from an interventional cardiology perspective on 02/11/2023.   Recommend uninterrupted dual antiplatelet therapy with Aspirin 81mg  daily and Ticagrelor 90mg  twice daily for a minimum of 12 months (ACS-Class I recommendation). Grant Lemma, MD  Findings Coronary Findings Diagnostic  Dominance: Right  Left Main Vessel was injected. Vessel is normal in caliber. Vessel is angiographically normal.  Left Anterior Descending Vessel was injected. Vessel is large. The vessel exhibits minimal luminal irregularities. Ost LAD to Prox LAD lesion is 15% stenosed.  Left Circumflex Vessel was injected. Vessel is large. Vessel is angiographically normal. The vessel is ectatic. Prox Cx to Mid Cx lesion is 50% stenosed.  Right Coronary Artery Vessel was injected. Vessel is large. There is mild  diffuse disease throughout the vessel. There is severe focal disease in the vessel. The vessel is mildly calcified. Prox RCA lesion is 15% stenosed. Mid RCA lesion is 90% stenosed. Vessel is the culprit lesion. The lesion is type B1, located at the bend and concentric. The lesion is  mildly calcified. Dense fibrous  Acute Marginal Branch Vessel is small in size.  Right Ventricular Branch Vessel is small in size.  Right Posterior Descending Artery Vessel is small in size.  First Right Posterolateral Branch Vessel is small in size.  Second Right Posterolateral Branch Vessel is small in size.  Third Right Posterolateral Branch Vessel is small in size.  Intervention  Mid RCA lesion Angioplasty Lesion length:  18 mm. CATH GUIDE ADROIT 6FR AL.75 guide catheter was inserted. WIRE ASAHI PROWATER 180CM guidewire used to cross lesion. Scoring balloon angioplasty was performed using a BALLN SCOREFLEX 3.50X15. Maximum pressure: 12 atm. Inflation time: 20 sec. After several attempts with standard balloons not staying in place, chose to use ScoreFlex balloon whichwas able to fully expand the fibrous lesion. Stent Lesion length:  18 mm. CATH GUIDE ADROIT 6FR AL.75 guide catheter was inserted. Lesion crossed with guidewire using a WIRE ASAHI PROWATER 180CM. Pre-stent angioplasty was performed using a BALLN EUPHORA RX 3.0X15. Maximum pressure:  12 atm. Inflation time:  20 sec. After 2 standard balloons, the score flex was used. BALLN EUPHORA RX 2.5X12 - 10 Atm x 20 Sec A drug-eluting stent was successfully placed using a STENT ONYX FRONTIER 3.5X22. Maximum pressure: 16 atm. Inflation time: 30 sec. Stent strut is well apposed. Post-stent angioplasty was performed using a BALLN [place] TREK NEO RX 4.0X15. Maximum pressure:  16 atm. Inflation time:  20 sec. Postdilated from 4.1 to 4.0 mm -&gt; 3 separate inflations A slightly longer send initially anticipated was placed because initially the balloon was watermelon seeding with inflation, therefore I need to cover the angioplastied area. Post-Intervention Lesion Assessment The intervention was successful. Pre-interventional TIMI flow is 3. Post-intervention TIMI flow is 3. Treated lesion length:  22 mm. No complications occurred at this  lesion. There is a 0% residual stenosis post intervention.     ECHOCARDIOGRAM  ECHOCARDIOGRAM COMPLETE 06/21/2023  Narrative ECHOCARDIOGRAM REPORT    Patient Name:   Grant [name] Date of Exam: 06/21/2023 Medical Rec #:  [id number]      Height:       73.0 in Accession #:    [id number]     Weight:       345.0 lb Date of Birth:  March 27, 1976      BSA:          2.713 m Patient Age:    47 years       BP:           102/53 mmHg Patient Gender: M              HR:           102 bpm. Exam Location:  [facility]  Procedure: 2D Echo, Cardiac Doppler, Color Doppler and Intracardiac Opacification Agent  Indications:     I48.91 Atrial Fibrillation  History:         Patient has prior history of Echocardiogram examinations, most recent 02/11/2023.  Sonographer:     Daphine Deutscher RDCS Referring Phys:  [id number] Gillis Santa Diagnosing Phys: Julien Nordmann MD  IMPRESSIONS   1. Left ventricular ejection fraction, by estimation, is 55 to 60%. The left ventricle has normal function. The left ventricle has no  regional wall motion abnormalities. There is mild left ventricular hypertrophy. Left ventricular diastolic parameters are indeterminate. 2. Right ventricular systolic function is normal. The right ventricular size is normal. 3. The mitral valve is normal in structure. Mild mitral valve regurgitation. No evidence of mitral stenosis. 4. The aortic valve is normal in structure. Aortic valve regurgitation is not visualized. No aortic stenosis is present. 5. There is mild dilatation of the aortic root, measuring 43 mm. 6. The inferior vena cava is normal in size with greater than 50% respiratory variability, suggesting right atrial pressure of 3 mmHg.  FINDINGS Left Ventricle: Left ventricular ejection fraction, by estimation, is 55 to 60%. The left ventricle has normal function. The left ventricle has no regional wall motion abnormalities. Definity contrast agent was given IV to delineate the  left ventricular endocardial borders. The left ventricular internal cavity size was normal in size. There is mild left ventricular hypertrophy. Left ventricular diastolic parameters are indeterminate.  Right Ventricle: The right ventricular size is normal. No increase in right ventricular wall thickness. Right ventricular systolic function is normal.  Left Atrium: Left atrial size was normal in size.  Right Atrium: Right atrial size was normal in size.  Pericardium: There is no evidence of pericardial effusion.  Mitral Valve: The mitral valve is normal in structure. Mild mitral valve regurgitation. No evidence of mitral valve stenosis.  Tricuspid Valve: The tricuspid valve is normal in structure. Tricuspid valve regurgitation is mild . No evidence of tricuspid stenosis.  Aortic Valve: The aortic valve is normal in structure. Aortic valve regurgitation is not visualized. No aortic stenosis is present.  Pulmonic Valve: The pulmonic valve was normal in structure. Pulmonic valve regurgitation is not visualized. No evidence of pulmonic stenosis.  Aorta: The aortic root is normal in size and structure. There is mild dilatation of the aortic root, measuring 43 mm.  Venous: The inferior vena cava is normal in size with greater than 50% respiratory variability, suggesting right atrial pressure of 3 mmHg.  IAS/Shunts: No atrial level shunt detected by color flow Doppler.   LEFT VENTRICLE PLAX 2D LVIDd:         5.70 cm LVIDs:         3.30 cm LV PW:         1.40 cm LV IVS:        1.20 cm LVOT diam:     2.50 cm LV SV:         85 LV SV Index:   31 LVOT Area:     4.91 cm   RIGHT VENTRICLE RV Basal diam:  4.90 cm RV S prime:     12.57 cm/s TAPSE (M-mode): 2.0 cm  LEFT ATRIUM             Index        RIGHT ATRIUM           Index LA diam:        5.20 cm 1.92 cm/m   RA Area:     15.30 cm LA Vol (A2C):   68.9 ml 25.40 ml/m  RA Volume:   38.60 ml  14.23 ml/m LA Vol (A4C):   43.3 ml  15.96 ml/m LA Biplane Vol: 59.4 ml 21.90 ml/m AORTIC VALVE LVOT Vmax:   91.62 cm/s LVOT Vmean:  65.880 cm/s LVOT VTI:    0.172 m  AORTA Ao Root diam: 4.30 cm Ao Asc diam:  3.60 cm  MV E velocity: 89.92 cm/s  TRICUSPID VALVE TR Peak  grad:   17.5 mmHg TR Vmax:        209.00 cm/s  SHUNTS Systemic VTI:  0.17 m Systemic Diam: 2.50 cm  Julien Nordmann MD Electronically signed by Julien Nordmann MD Signature Date/Time: 06/22/2023/7:45:16 AM    Final   TEE  ECHO TEE 06/22/2023  Narrative TRANSESOPHOGEAL ECHO REPORT    Patient Name:   CARLTON MACER Date of Exam: 06/22/2023 Medical Rec #:  [id number]      Height:       73.0 in Accession #:    [id number]     Weight:       345.0 lb Date of Birth:  06-26-1976      BSA:          2.713 m Patient Age:    47 years       BP:           102/69 mmHg Patient Gender: M              HR:           85 bpm. Exam Location:  [facility]  Procedure: Transesophageal Echo, Cardiac Doppler, Color Doppler and Saline Contrast Bubble Study  Indications:     Atrial Fibrillation I48.91  History:         Patient has prior history of Echocardiogram examinations, most recent 06/22/2023. Risk Factors:Diabetes and Hypertension.  Sonographer:     Cristela Blue Referring Phys:  [id number] Grant [name] Diagnosing Phys: Julien Nordmann MD  PROCEDURE: After discussion of the risks and benefits of a TEE, an informed consent was obtained from the patient. TEE procedure time was 30 minutes. The transesophogeal probe was passed without difficulty through the esophogus of the patient. Local oropharyngeal anesthetic was provided with Cetacaine and viscous lidocaine. Sedation performed by different physician. Image quality was excellent. The patient's vital signs; including heart rate, blood pressure, and oxygen saturation; remained stable throughout the procedure. The patient developed no complications during the procedure. A successful direct current cardioversion was  performed at 200 joules with 1 attempt.  IMPRESSIONS   1. Left ventricular ejection fraction, by estimation, is 45 to 50%. The left ventricle has mildly decreased function. The left ventricle demonstrates global hypokinesis. 2. Right ventricular systolic function is mildly reduced. The right ventricular size is normal. 3. The mitral valve is normal in structure. No evidence of mitral valve regurgitation. No evidence of mitral stenosis. 4. The aortic valve is tricuspid. Aortic valve regurgitation is not visualized. No aortic stenosis is present. 5. The inferior vena cava is normal in size with greater than 50% respiratory variability, suggesting right atrial pressure of 3 mmHg. 6. rhythm is atrial fibrillation with RVR 7. Left atrial size was moderately dilated. No left atrial/left atrial appendage thrombus was detected. 8. Agitated saline contrast bubble study was positive with shunting observed within 3-6 cardiac cycles suggestive of interatrial shunt. There is a small patent foramen ovale with predominantly right to left shunting across the atrial septum.  Conclusion(s)/Recommendation(s): Normal biventricular function without evidence of hemodynamically significant valvular heart disease.  FINDINGS Left Ventricle: Left ventricular ejection fraction, by estimation, is 45 to 50%. The left ventricle has mildly decreased function. The left ventricle demonstrates global hypokinesis. The left ventricular internal cavity size was normal in size. There is no left ventricular hypertrophy.  Right Ventricle: The right ventricular size is normal. No increase in right ventricular wall thickness. Right ventricular systolic function is mildly reduced.  Left Atrium: Left atrial size was moderately dilated. No  left atrial/left atrial appendage thrombus was detected.  Right Atrium: Right atrial size was normal in size.  Pericardium: There is no evidence of pericardial effusion.  Mitral Valve: The mitral  valve is normal in structure. No evidence of mitral valve regurgitation. No evidence of mitral valve stenosis.  Tricuspid Valve: The tricuspid valve is normal in structure. Tricuspid valve regurgitation is mild . No evidence of tricuspid stenosis.  Aortic Valve: The aortic valve is tricuspid. Aortic valve regurgitation is not visualized. No aortic stenosis is present.  Pulmonic Valve: The pulmonic valve was normal in structure. Pulmonic valve regurgitation is not visualized. No evidence of pulmonic stenosis.  Aorta: The aortic root is normal in size and structure.  Venous: The inferior vena cava is normal in size with greater than 50% respiratory variability, suggesting right atrial pressure of 3 mmHg.  IAS/Shunts: No atrial level shunt detected by color flow Doppler. Agitated saline contrast was given intravenously to evaluate for intracardiac shunting. Agitated saline contrast bubble study was positive with shunting observed within 3-6 cardiac cycles suggestive of interatrial shunt. A small patent foramen ovale is detected with predominantly right to left shunting across the atrial septum.  Julien Nordmann MD Electronically signed by Julien Nordmann MD Signature Date/Time: 06/23/2023/10:31:00 AM    Final    CT SCANS  CT CORONARY MORPH W/CTA COR W/SCORE 11/12/2022  Addendum 11/16/2022 12:27 PM ADDENDUM REPORT: 11/16/2022 12:25  EXAM: OVER-READ INTERPRETATION  CT CHEST  The following report is an over-read performed by radiologist Dr. Curly Shores [facility] on 11/16/2022. This over-read does not include interpretation of cardiac or coronary anatomy or pathology. The coronary CTA interpretation by the cardiologist is attached.  COMPARISON:  None.  FINDINGS: Cardiovascular:  Findings discussed in the body of the report.  Mediastinum/Nodes: No suspicious adenopathy identified. Imaged mediastinal structures are unremarkable.  Lungs/Pleura: Imaged lungs are  clear. No pleural effusion or pneumothorax.  Upper Abdomen: Small hiatal hernia.  Musculoskeletal: No chest wall abnormality. Thoracic degenerative changes. No acute or significant osseous findings.  IMPRESSION: Small hiatal hernia. Otherwise no significant extracardiac incidental findings identified.   Electronically Signed By: Layla Maw M.D. On: 11/16/2022 12:25  Narrative CLINICAL DATA:  39M with chest pain  EXAM: Cardiac/Coronary CTA  TECHNIQUE: The patient was scanned on a [employer].  FINDINGS: A 100 kV prospective scan was triggered in the descending thoracic aorta at 111 HU's. Axial non-contrast 3 mm slices were carried out through the heart. The data set was analyzed on a dedicated work station and scored using the Agatson method. Gantry rotation speed was 250 msecs and collimation was .6 mm. No beta blockade and 0.8 mg of sl NTG was given. The 3D data set was reconstructed in 5% intervals of the 35-75% of the R-R cycle. Phases were analyzed on a dedicated work station using MPR, MIP and VRT modes. The patient received 100 cc of contrast.  Coronary Arteries:  Normal coronary origin.  Right dominance.  RCA is a large dominant artery that gives rise to PDA and PLA. Calcified plaque in proximal RCA causes 25-49% stenosis. Calcified plaque in mid RCA causes 50-69%. Calcified plaque in distal RCA causes 50-69% stenosis. RCA difficult to evaluate due to artifact, appears moderate stenosis in mid/distal RCA but cannot exclude more significant obstruction.  Left main is a large artery that gives rise to LAD and LCX arteries.  LAD is a large vessel. Calcified plaque in proximal LAD causes 25-49% stenosis. Mixed plaque in mid LAD causes  25-49% stenosis.  LCX is a non-dominant artery that gives rise to one large OM1 branch. Mixed plaque in proximal LCX causes 25-49% stenosis.  Other findings:  Left Ventricle: Normal size  Left Atrium: Mild  enlargement  Pulmonary Veins: Normal configuration  Right Ventricle: Moderately dilated  Right Atrium: Mild enlargement  Cardiac valves: No calcifications  Thoracic aorta: Normal size  Pulmonary Arteries: Dilated main pulmonary artery measuring 31mm  Systemic Veins: Normal drainage  Pericardium: Normal thickness  IMPRESSION: 1. Coronary calcium score of 1078. This was 99th percentile for age and sex matched control.  2.  Normal coronary origin with right dominance.  3. Poor quality study. There is significant noise likely secondary to obesity, step artifacts from cardiac motion, and severe coronary calcifications. Recommend alternative imaging modality  4. RCA is difficult to evaluate, appears moderate stenosis in mid/distal RCA but cannot exclude more significant obstruction. Unable to send for CTFFR, was rejected due to artifact. Recommend alternative evaluation as above  5. Mild (25-49%) stenosis in proximal/mid LAD, proximal LCX, and proximal RCA  6.  Dilated main pulmonary artery measuring 31mm  CAD-RADS N Non-diagnostic study. Obstructive CAD can't be excluded. Alternative evaluation is recommended.  Electronically Signed: By: Epifanio Lesches M.D. On: 11/14/2022 15:54         Recent Labs: 02/10/2023: B Natriuretic Peptide 5.2 03/22/2023: ALT 33 06/19/2023: TSH 1.527 06/23/2023: BUN 13; Creatinine, Ser 0.87; Hemoglobin 14.9; Magnesium 2.4; Platelets 183; Potassium 4.1; Sodium 137  Recent Lipid Panel    Component Value Date/Time   CHOL 160 02/11/2023 0912   CHOL 163 11/19/2022 0812   TRIG 103 02/11/2023 0912   HDL 22 (L) 02/11/2023 0912   HDL 28 (L) 11/19/2022 0812   CHOLHDL 7.3 02/11/2023 0912   VLDL 21 02/11/2023 0912   LDLCALC 117 (H) 02/11/2023 0912   LDLCALC 119 (H) 11/19/2022 4098    History of Present Illness    47 year old male with the above past medical history including CAD s/p PCI with DES-RCA in 01/2023, paroxysmal atrial  fibrillation s/p TEE guided DCCV in 05/2023, mitral valve regurgitation, aortic root dilation, hyperlipidemia, suspected OSA, and obesity.  Coronary CT angiogram in 10/2022 revealed coronary calcium score of 1078 (11 percentile), overall poor quality, interpreted showing moderate stenosis of the mid to distal RCA, likely underestimated.  He was hospitalized in July 2024 in the setting of sudden onset severe chest discomfort.  He underwent cardiac catheterization which revealed 90% mid RCA stenosis, s/p DES, 50% proximal and mid left circumflex stenosis, 50% ostial LAD stenosis.  Echocardiogram showed normal LV function, RWMA.  He was last seen in the office on 02/17/2023  and was stable from a cardiac standpoint.  He presented to the ED on 06/19/2023 with new onset atrial fibrillation with RVR.  Echocardiogram showed EF 55 to 60%, normal LV function, no RWMA, mild LVH, indeterminate diastolic parameters, normal LV systolic function, mild mitral valve regurgitation, mild dilation of the aortic root measuring 43 mm.  Ventricular rate proved difficult to control, and he ultimately underwent successful TEE guided cardioversion on 06/22/2023.  He was started on Eliquis.  Metoprolol was decreased to 25 mg daily in the setting of borderline low blood pressure.  Outpatient EP consultation was recommended.  He was transitioned from Brilinta to Plavix given need for anticoagulation and was discharged home in stable condition on 06/23/2023.  He presents today for follow-up.  Since his hospitalization has done well from a cardiac standpoint.  He denies any palpitations, dizziness, denies  symptoms concerning for angina.  He notes that his primary symptom that prompted his most recent emergency room visit was fatigue.  This has resolved with restoration of sinus rhythm. Overall, he reports feeling well.  Home Medications    Current Outpatient Medications  Medication Sig Dispense Refill   clopidogrel (PLAVIX) 75 MG tablet  Take 1 tablet (75 mg total) by mouth daily. 30 tablet 11   ezetimibe (ZETIA) 10 MG tablet Take 1 tablet (10 mg total) by mouth daily. 30 tablet 11   metoprolol succinate (TOPROL-XL) 25 MG 24 hr tablet Take 1 tablet (25 mg total) by mouth daily. 90 tablet 1   ondansetron (ZOFRAN-ODT) 8 MG disintegrating tablet Take 1 tablet (8 mg total) by mouth every 8 (eight) hours as needed for nausea. 20 tablet 0   pantoprazole (PROTONIX) 40 MG tablet Take 1 tablet (40 mg total) by mouth daily. 30 tablet 11   rosuvastatin (CRESTOR) 40 MG tablet Take 1 tablet (40 mg total) by mouth daily. 90 tablet 1   apixaban (ELIQUIS) 5 MG TABS tablet Take 1 tablet (5 mg total) by mouth 2 (two) times daily. 60 tablet 11   No current facility-administered medications for this visit.     Review of Systems    He denies chest pain, palpitations, dyspnea, pnd, orthopnea, n, v, dizziness, syncope, edema, weight gain, or early satiety. All other systems reviewed and are otherwise negative except as noted above.   Physical Exam    VS:  BP 132/82 (BP Location: Left Arm, Patient Position: Sitting, Cuff Size: Large)   Pulse 62   Ht 6' (1.829 m)   Wt (!) 350 lb 12.8 oz (159.1 kg)   SpO2 95%   BMI 47.58 kg/m  STOP-Bang Score:  7      GEN: Well nourished, well developed, in no acute distress. HEENT: normal. Neck: Supple, no JVD, carotid bruits, or masses. Cardiac: RRR, no murmurs, rubs, or gallops. No clubbing, cyanosis, edema.  Radials/DP/PT 2+ and equal bilaterally.  Respiratory:  Respirations regular and unlabored, clear to auscultation bilaterally. GI: Soft, nontender, nondistended, BS + x 4. MS: no deformity or atrophy. Skin: warm and dry, no rash. Neuro:  Strength and sensation are intact. Psych: Normal affect.  Accessory Clinical Findings    ECG personally reviewed by me today - EKG Interpretation Date/Time:  Friday July 01 2023 08:57:34 EST Ventricular Rate:  62 PR Interval:  152 QRS Duration:  114 QT  Interval:  418 QTC Calculation: 424 R Axis:   34  Text Interpretation: Normal sinus rhythm Normal ECG When compared with ECG of 22-Jun-2023 13:05, PREVIOUS ECG IS PRESENT Confirmed by Bernadene Person ([id number]) on 07/01/2023 9:35:51 AM  - no acute changes.   Lab Results  Component Value Date   WBC 8.5 06/23/2023   HGB 14.9 06/23/2023   HCT 45.3 06/23/2023   MCV 85.2 06/23/2023   PLT 183 06/23/2023   Lab Results  Component Value Date   CREATININE 0.87 06/23/2023   BUN 13 06/23/2023   NA 137 06/23/2023   K 4.1 06/23/2023   CL 102 06/23/2023   CO2 26 06/23/2023   Lab Results  Component Value Date   ALT 33 03/22/2023   AST 23 03/22/2023   ALKPHOS 66 03/22/2023   BILITOT 0.5 03/22/2023   Lab Results  Component Value Date   CHOL 160 02/11/2023   HDL 22 (L) 02/11/2023   LDLCALC 117 (H) 02/11/2023   TRIG 103 02/11/2023   CHOLHDL 7.3 02/11/2023  No results found for: "HGBA1C"  Assessment & Plan    1. Persistent atrial fibrillation: S/p TEE guided DCCV in 05/2023. Maintaining NSR.  He denies any recent palpitations.  He is high risk for recurrent atrial fibrillation in the setting of obesity, CAD, OSA. Pending sleep study as below.  He is tolerating low-dose metoprolol.  Should he have recurrent atrial fibrillation, he would likely benefit from EP referral. Discussed home monitoring with [employer] mobile device.  Reviewed ED precautions.  Continue metoprolol, Eliquis.  2. CAD: S/p PCI with DES-RCA in 01/2023. Stable with no anginal symptoms. No indication for ischemic evaluation.  Continue Plavix, metoprolol, Crestor, Zetia.  No ASA in the setting of chronic DOAC therapy.  3. Hyperlipidemia: LDL was 117 in 01/2023.  He is due for repeat fasting lipids, LFTs, will update.  Continue Crestor, Zetia.  4. Aortic root dilation: Measured 43 mm on most recent echocardiogram.  Consider repeat CT chest aorta in 05/2024 for routine monitoring.  5. OSA:  He has still not had a sleep study, will  reach out to our sleep coordinator to follow-up on this.   6. Obesity: Continue to encourage ongoing lifestyle modifications with diet and exercise.  7. Disposition: Follow-up in 3 months.        Joylene Grapes, NP 07/01/2023, 1:24 PM

## 2023-07-01 NOTE — Telephone Encounter (Signed)
**Note De-Identified Grant [name] Obfuscation** I attempted to do the pts split night sleep study PA through the [employer] provider portal several times and received this message each time: A member was not found based on the search criteria. Review the Member Details to search again.  I called [employer] and s/w Anniece who advised me that the pt is not in their database and recommended that I contact the pt as this maybe because this is the end of 2024 and his ins may not have updated yet or that he does not have a plan with them any longer.  I got no answer so I left a detailed message on the pts VM (Ok per [facility]) asking him to call me back concerning his insurance so I can do a prior authorization through his plan for his sleep study. I did leave my name and the office phone number in my message.

## 2023-07-05 ENCOUNTER — Telehealth: Payer: Self-pay

## 2023-07-05 MED ORDER — APIXABAN 5 MG PO TABS: 5.0000 mg | ORAL_TABLET | Freq: Two times a day (BID) | ORAL | 11 refills | Status: AC

## 2023-07-05 NOTE — Telephone Encounter (Signed)
Wife walked in office stated [facility] pharmacy never received Eliquis.Advised I will send in refill.

## 2023-09-02 ENCOUNTER — Emergency Department (HOSPITAL_BASED_OUTPATIENT_CLINIC_OR_DEPARTMENT_OTHER)
Admission: EM | Admit: 2023-09-02 | Discharge: 2023-09-02 | Disposition: A | Discharge status: Home / Self Care | Payer: BC Managed Care – PPO | Attending: Emergency Medicine | Admitting: Emergency Medicine

## 2023-09-02 ENCOUNTER — Encounter (HOSPITAL_BASED_OUTPATIENT_CLINIC_OR_DEPARTMENT_OTHER): Payer: Self-pay | Admitting: Emergency Medicine

## 2023-09-02 ENCOUNTER — Emergency Department (HOSPITAL_BASED_OUTPATIENT_CLINIC_OR_DEPARTMENT_OTHER): Payer: BC Managed Care – PPO

## 2023-09-02 ENCOUNTER — Other Ambulatory Visit: Payer: Self-pay

## 2023-09-02 DIAGNOSIS — R002 Palpitations: Secondary | ICD-10-CM | POA: Insufficient documentation

## 2023-09-02 DIAGNOSIS — R072 Precordial pain: Secondary | ICD-10-CM | POA: Diagnosis not present

## 2023-09-02 DIAGNOSIS — Z20822 Contact with and (suspected) exposure to covid-19: Secondary | ICD-10-CM | POA: Insufficient documentation

## 2023-09-02 DIAGNOSIS — Z7901 Long term (current) use of anticoagulants: Secondary | ICD-10-CM | POA: Insufficient documentation

## 2023-09-02 DIAGNOSIS — Z7902 Long term (current) use of antithrombotics/antiplatelets: Secondary | ICD-10-CM | POA: Insufficient documentation

## 2023-09-02 DIAGNOSIS — Z79899 Other long term (current) drug therapy: Secondary | ICD-10-CM | POA: Insufficient documentation

## 2023-09-02 DIAGNOSIS — R079 Chest pain, unspecified: Secondary | ICD-10-CM | POA: Diagnosis not present

## 2023-09-02 LAB — CBC
HCT: 43.4 % (ref 39.0–52.0)
Hemoglobin: 14.5 g/dL (ref 13.0–17.0)
MCH: 28.1 pg (ref 26.0–34.0)
MCHC: 33.4 g/dL (ref 30.0–36.0)
MCV: 84.1 fL (ref 80.0–100.0)
Platelets: 186 10*3/uL (ref 150–400)
RBC: 5.16 MIL/uL (ref 4.22–5.81)
RDW: 12.8 % (ref 11.5–15.5)
WBC: 6.1 10*3/uL (ref 4.0–10.5)
nRBC: 0 % (ref 0.0–0.2)

## 2023-09-02 LAB — RESP PANEL BY RT-PCR (RSV, FLU A&B, COVID)  RVPGX2
Influenza A by PCR: NEGATIVE
Influenza B by PCR: NEGATIVE
Resp Syncytial Virus by PCR: NEGATIVE
SARS Coronavirus 2 by RT PCR: NEGATIVE

## 2023-09-02 LAB — COMPREHENSIVE METABOLIC PANEL
ALT: 16 U/L (ref 0–44)
AST: 34 U/L (ref 15–41)
Albumin: 3.5 g/dL (ref 3.5–5.0)
Alkaline Phosphatase: 58 U/L (ref 38–126)
Anion gap: 4 — ABNORMAL LOW (ref 5–15)
BUN: 8 mg/dL (ref 6–20)
CO2: 26 mmol/L (ref 22–32)
Calcium: 8.7 mg/dL — ABNORMAL LOW (ref 8.9–10.3)
Chloride: 106 mmol/L (ref 98–111)
Creatinine, Ser: 0.75 mg/dL (ref 0.61–1.24)
GFR, Estimated: >60 mL/min (ref >60)
Glucose, Bld: 127 mg/dL — ABNORMAL HIGH (ref 70–99)
Potassium: 4.9 mmol/L (ref 3.5–5.1)
Sodium: 136 mmol/L (ref 135–145)
Total Bilirubin: 1 mg/dL (ref 0.0–1.2)
Total Protein: 6.4 g/dL — ABNORMAL LOW (ref 6.5–8.1)

## 2023-09-02 LAB — TROPONIN I (HIGH SENSITIVITY)
Troponin I (High Sensitivity): 2 ng/L (ref <18)
Troponin I (High Sensitivity): 2 ng/L (ref <18)

## 2023-09-02 MED ORDER — FAMOTIDINE 20 MG PO TABS
20.0000 mg | ORAL_TABLET | Freq: Once | ORAL | Status: AC
  Administered 2023-09-02: 20 mg via ORAL
  Filled 2023-09-02: qty 1

## 2023-09-02 MED ORDER — ACETAMINOPHEN 500 MG PO TABS
1000.0000 mg | ORAL_TABLET | Freq: Once | ORAL | Status: AC
  Administered 2023-09-02: 1000 mg via ORAL
  Filled 2023-09-02: qty 2

## 2023-09-02 MED ORDER — IBUPROFEN 400 MG PO TABS
400.0000 mg | ORAL_TABLET | Freq: Once | ORAL | Status: AC
  Administered 2023-09-02: 400 mg via ORAL
  Filled 2023-09-02: qty 1

## 2023-09-02 MED ORDER — ALUM & MAG HYDROXIDE-SIMETH 200-200-20 MG/5ML PO SUSP
30.0000 mL | Freq: Once | ORAL | Status: AC
  Administered 2023-09-02: 30 mL via ORAL
  Filled 2023-09-02: qty 30

## 2023-09-02 NOTE — ED Notes (Signed)
 Patient given discharge instructions. Questions were answered. Patient verbalized understanding of discharge instructions and care at home.

## 2023-09-02 NOTE — ED Triage Notes (Signed)
 Pt endorses CP, palpitations, shob, HA and n/v starting at 0630 today

## 2023-09-02 NOTE — ED Provider Notes (Signed)
 [place] EMERGENCY DEPARTMENT AT [facility] Provider Note   CSN: [id number] Arrival date & time: 09/02/23  0754     History  Chief Complaint  Patient presents with   Palpitations    Grant [name] is a 48 y.o. male.  Pt c/o mid chest pain since this AM at rest. Dull, non radiating, not pleuritic, mid chest. No associated nv, diaphoresis or  sob. Notes intermittent sense of palpitations, as if skipped beat/fluttery sensation. No persistent fast heart beating. Hx afib. Compliant w home meds including eliquis . Hx gerd, spouse indicates changed to protonix  and not sure reflux meds are strong enough. Hx cad/stent, unsure if pain same. No other recent chest pain or any exertional chest pain. No unusual doe. No leg pain or swelling. No hx dvt/pe.   The history is provided by the patient, the spouse and medical records.  Palpitations Associated symptoms: chest pain   Associated symptoms: no back pain, no cough, no shortness of breath and no vomiting        Home Medications Prior to Admission medications   Medication Sig Start Date End Date Taking? Authorizing Provider  apixaban  (ELIQUIS ) 5 MG TABS tablet Take 1 tablet (5 mg total) by mouth 2 (two) times daily. 07/05/23 07/04/24  Daneen Damien BROCKS, NP  clopidogrel  (PLAVIX ) 75 MG tablet Take 1 tablet (75 mg total) by mouth daily. 06/24/23 06/23/24  Von Bellis, MD  ezetimibe  (ZETIA ) 10 MG tablet Take 1 tablet (10 mg total) by mouth daily. 06/24/23 06/23/24  Von Bellis, MD  metoprolol  succinate (TOPROL -XL) 25 MG 24 hr tablet Take 1 tablet (25 mg total) by mouth daily. 02/12/23   Amin, Sumayya, MD  ondansetron  (ZOFRAN -ODT) 8 MG disintegrating tablet Take 1 tablet (8 mg total) by mouth every 8 (eight) hours as needed for nausea. 06/07/23   Charlyn Sora, MD  pantoprazole  (PROTONIX ) 40 MG tablet Take 1 tablet (40 mg total) by mouth daily. 06/24/23 06/23/24  Von Bellis, MD  rosuvastatin  (CRESTOR ) 40 MG tablet Take 1 tablet (40  mg total) by mouth daily. 02/12/23   Caleen Qualia, MD      Allergies    Patient has no known allergies.    Review of Systems   Review of Systems  Constitutional:  Negative for fever.  HENT:  Negative for sore throat.   Respiratory:  Negative for cough and shortness of breath.   Cardiovascular:  Positive for chest pain and palpitations. Negative for leg swelling.  Gastrointestinal:  Negative for abdominal pain and vomiting.  Genitourinary:  Negative for dysuria and flank pain.  Musculoskeletal:  Positive for myalgias. Negative for back pain and neck pain.  Skin:  Negative for rash.  Neurological:  Negative for headaches.    Physical Exam Updated Vital Signs BP 90/64   Pulse 75   Temp 98.1 F (36.7 C) (Oral)   Resp 16   Wt (!) 156.5 kg   SpO2 98%   BMI 46.79 kg/m  Physical Exam Vitals and nursing note reviewed.  Constitutional:      Appearance: Normal appearance. He is well-developed.  HENT:     Head: Atraumatic.     Nose: Nose normal.     Mouth/Throat:     Mouth: Mucous membranes are moist.     Pharynx: Oropharynx is clear.  Eyes:     General: No scleral icterus.    Conjunctiva/sclera: Conjunctivae normal.     Pupils: Pupils are equal, round, and reactive to light.  Neck:  Trachea: No tracheal deviation.     Comments: No stiffness or rigidity.  Cardiovascular:     Rate and Rhythm: Normal rate and regular rhythm.     Pulses: Normal pulses.     Heart sounds: Normal heart sounds. No murmur heard.    No friction rub. No gallop.  Pulmonary:     Effort: Pulmonary effort is normal. No accessory muscle usage or respiratory distress.     Breath sounds: Normal breath sounds.  Chest:     Chest wall: No tenderness.  Abdominal:     General: Bowel sounds are normal. There is no distension.     Palpations: Abdomen is soft.     Tenderness: There is no abdominal tenderness.  Genitourinary:    Comments: No cva tenderness. Musculoskeletal:        General: No swelling or  tenderness.     Cervical back: Normal range of motion and neck supple. No rigidity.     Right lower leg: No edema.     Left lower leg: No edema.  Skin:    General: Skin is warm and dry.     Findings: No rash.  Neurological:     Mental Status: He is alert.     Comments: Alert, speech clear. Motor/sens grossly intact. Steady gait.   Psychiatric:        Mood and Affect: Mood normal.     ED Results / Procedures / Treatments   Labs (all labs ordered are listed, but only abnormal results are displayed) Results for orders placed or performed during the hospital encounter of 09/02/23  Resp panel by RT-PCR (RSV, Flu A&B, Covid) Anterior Nasal Swab   Collection Time: 09/02/23  8:12 AM   Specimen: Anterior Nasal Swab  Result Value Ref Range   SARS Coronavirus 2 by RT PCR NEGATIVE NEGATIVE   Influenza A by PCR NEGATIVE NEGATIVE   Influenza B by PCR NEGATIVE NEGATIVE   Resp Syncytial Virus by PCR NEGATIVE NEGATIVE  Comprehensive metabolic panel   Collection Time: 09/02/23  8:12 AM  Result Value Ref Range   Sodium 136 135 - 145 mmol/L   Potassium 4.9 3.5 - 5.1 mmol/L   Chloride 106 98 - 111 mmol/L   CO2 26 22 - 32 mmol/L   Glucose, Bld 127 (H) 70 - 99 mg/dL   BUN 8 6 - 20 mg/dL   Creatinine, Ser 9.24 0.61 - 1.24 mg/dL   Calcium  8.7 (L) 8.9 - 10.3 mg/dL   Total Protein 6.4 (L) 6.5 - 8.1 g/dL   Albumin 3.5 3.5 - 5.0 g/dL   AST 34 15 - 41 U/L   ALT 16 0 - 44 U/L   Alkaline Phosphatase 58 38 - 126 U/L   Total Bilirubin 1.0 0.0 - 1.2 mg/dL   GFR, Estimated >39 >39 mL/min   Anion gap 4 (L) 5 - 15  CBC   Collection Time: 09/02/23  8:12 AM  Result Value Ref Range   WBC 6.1 4.0 - 10.5 K/uL   RBC 5.16 4.22 - 5.81 MIL/uL   Hemoglobin 14.5 13.0 - 17.0 g/dL   HCT 56.5 60.9 - 47.9 %   MCV 84.1 80.0 - 100.0 fL   MCH 28.1 26.0 - 34.0 pg   MCHC 33.4 30.0 - 36.0 g/dL   RDW 87.1 88.4 - 84.4 %   Platelets 186 150 - 400 K/uL   nRBC 0.0 0.0 - 0.2 %  Troponin I (High Sensitivity)   Collection  Time: 09/02/23  8:12  AM  Result Value Ref Range   Troponin I (High Sensitivity) 2 <18 ng/L  Troponin I (High Sensitivity)   Collection Time: 09/02/23 10:32 AM  Result Value Ref Range   Troponin I (High Sensitivity) 2 <18 ng/L    EKG None  Radiology DG Chest Port 1 View Result Date: 09/02/2023 CLINICAL DATA:  Chest pain and palpitations. EXAM: PORTABLE CHEST 1 VIEW COMPARISON:  Chest radiograph 06/19/2023 FINDINGS: Monitoring leads overlie the patient. Cardiac contours upper limits of normal. No large area pulmonary consolidation. No pleural effusion or pneumothorax. IMPRESSION: No active disease. Electronically Signed   By: Bard Moats M.D.   On: 09/02/2023 08:40    Procedures Procedures    Medications Ordered in ED Medications  acetaminophen  (TYLENOL ) tablet 1,000 mg (1,000 mg Oral Given 09/02/23 0826)  alum & mag hydroxide-simeth (MAALOX/MYLANTA) 200-200-20 MG/5ML suspension 30 mL (30 mLs Oral Given 09/02/23 0826)  famotidine  (PEPCID ) tablet 20 mg (20 mg Oral Given 09/02/23 0826)  ibuprofen  (ADVIL ) tablet 400 mg (400 mg Oral Given 09/02/23 1057)    ED Course/ Medical Decision Making/ A&P                                 Medical Decision Making Problems Addressed: Palpitations: acute illness or injury with systemic symptoms that poses a threat to life or bodily functions Precordial chest pain: acute illness or injury with systemic symptoms that poses a threat to life or bodily functions  Amount and/or Complexity of Data Reviewed Independent Historian: spouse    Details: hx External Data Reviewed: notes. Labs: ordered. Decision-making details documented in ED Course. Radiology: ordered and independent interpretation performed. Decision-making details documented in ED Course. ECG/medicine tests: ordered and independent interpretation performed. Decision-making details documented in ED Course.  Risk OTC drugs. Prescription drug management. Decision regarding  hospitalization.   Continuous pulse ox and cardiac monitoring. Labs ordered/sent. Imaging ordered.   Differential diagnosis includes acs, msk cp, gi cp, etc. Dispo decision including potential need for admission considered - will get labs and imaging and reassess.   Reviewed nursing notes and prior charts for additional history. External reports reviewed. Additional history from: spouse.   Acetaminophen  po, maalox po, pepcid  po.   Cardiac monitor: sinus rhythm, rate 78.  Labs reviewed/interpreted by me - trop normal.   Xrays reviewed/interpreted by me - no pna.   Additional labs reviewed/interpreted by me - delta trop normal and not increasing.   Recheck pt, no chest pain, no sob or increased wob.   Pt currently appears stable for ED d/c.  Rec close cardiology/pcp f/u.  Return precautions provided.          Final Clinical Impression(s) / ED Diagnoses Final diagnoses:  Palpitations  Precordial chest pain    Rx / DC Orders ED Discharge Orders     None         Bernard Drivers, MD 09/02/23 1154

## 2023-09-02 NOTE — Discharge Instructions (Addendum)
 It was our pleasure to provide your ER care today - we hope that you feel better.  If GI/reflux symptoms, try taking pepcid  and maalox as need for symptom relief.   For recent chest pain, follow up closely with cardiologist in the next 1-2 weeks - we made referral, and they should be contacting you with an appointment in the next couple days.   Return to ER right away if worse, new symptoms, fevers, recurrent/persistent chest pain, increased trouble breathing, persistent fast heart beating, fainting, or other concern.

## 2023-09-12 ENCOUNTER — Other Ambulatory Visit: Payer: Self-pay

## 2023-09-12 MED ORDER — METOPROLOL SUCCINATE ER 25 MG PO TB24: 25.0000 mg | ORAL_TABLET | Freq: Every day | ORAL | 3 refills | Status: AC

## 2023-09-20 ENCOUNTER — Emergency Department (HOSPITAL_BASED_OUTPATIENT_CLINIC_OR_DEPARTMENT_OTHER)
Admission: EM | Admit: 2023-09-20 | Discharge: 2023-09-20 | Disposition: A | Discharge status: Home / Self Care | Payer: Self-pay | Attending: Emergency Medicine | Admitting: Emergency Medicine

## 2023-09-20 ENCOUNTER — Encounter (HOSPITAL_BASED_OUTPATIENT_CLINIC_OR_DEPARTMENT_OTHER): Payer: Self-pay

## 2023-09-20 ENCOUNTER — Other Ambulatory Visit (HOSPITAL_BASED_OUTPATIENT_CLINIC_OR_DEPARTMENT_OTHER): Payer: Self-pay

## 2023-09-20 ENCOUNTER — Emergency Department (HOSPITAL_BASED_OUTPATIENT_CLINIC_OR_DEPARTMENT_OTHER): Payer: Self-pay

## 2023-09-20 ENCOUNTER — Other Ambulatory Visit: Payer: Self-pay

## 2023-09-20 DIAGNOSIS — R1033 Periumbilical pain: Secondary | ICD-10-CM

## 2023-09-20 DIAGNOSIS — R519 Headache, unspecified: Secondary | ICD-10-CM | POA: Insufficient documentation

## 2023-09-20 DIAGNOSIS — I4891 Unspecified atrial fibrillation: Secondary | ICD-10-CM | POA: Insufficient documentation

## 2023-09-20 DIAGNOSIS — R112 Nausea with vomiting, unspecified: Secondary | ICD-10-CM

## 2023-09-20 DIAGNOSIS — Z79899 Other long term (current) drug therapy: Secondary | ICD-10-CM | POA: Insufficient documentation

## 2023-09-20 DIAGNOSIS — Z7901 Long term (current) use of anticoagulants: Secondary | ICD-10-CM | POA: Insufficient documentation

## 2023-09-20 HISTORY — DX: Unspecified atrial fibrillation: I48.91

## 2023-09-20 LAB — LIPASE, BLOOD: Lipase: 17 U/L (ref 11–51)

## 2023-09-20 LAB — CBC
HCT: 46.6 % (ref 39.0–52.0)
Hemoglobin: 14.7 g/dL (ref 13.0–17.0)
MCH: 27.5 pg (ref 26.0–34.0)
MCHC: 31.5 g/dL (ref 30.0–36.0)
MCV: 87.1 fL (ref 80.0–100.0)
Platelets: 195 10*3/uL (ref 150–400)
RBC: 5.35 MIL/uL (ref 4.22–5.81)
RDW: 12.9 % (ref 11.5–15.5)
WBC: 5.4 10*3/uL (ref 4.0–10.5)
nRBC: 0 % (ref 0.0–0.2)

## 2023-09-20 LAB — URINALYSIS, ROUTINE W REFLEX MICROSCOPIC
Bilirubin Urine: NEGATIVE
Glucose, UA: NEGATIVE mg/dL
Hgb urine dipstick: NEGATIVE
Ketones, ur: NEGATIVE mg/dL
Leukocytes,Ua: NEGATIVE
Nitrite: NEGATIVE
Specific Gravity, Urine: 1.027 (ref 1.005–1.030)
pH: 7 (ref 5.0–8.0)

## 2023-09-20 LAB — RESP PANEL BY RT-PCR (RSV, FLU A&B, COVID)  RVPGX2
Influenza A by PCR: NEGATIVE
Influenza B by PCR: NEGATIVE
Resp Syncytial Virus by PCR: NEGATIVE
SARS Coronavirus 2 by RT PCR: NEGATIVE

## 2023-09-20 LAB — COMPREHENSIVE METABOLIC PANEL
ALT: 19 U/L (ref 0–44)
AST: 14 U/L — ABNORMAL LOW (ref 15–41)
Albumin: 4.1 g/dL (ref 3.5–5.0)
Alkaline Phosphatase: 55 U/L (ref 38–126)
Anion gap: 5 (ref 5–15)
BUN: 7 mg/dL (ref 6–20)
CO2: 29 mmol/L (ref 22–32)
Calcium: 8.7 mg/dL — ABNORMAL LOW (ref 8.9–10.3)
Chloride: 106 mmol/L (ref 98–111)
Creatinine, Ser: 0.88 mg/dL (ref 0.61–1.24)
GFR, Estimated: >60 mL/min (ref >60)
Glucose, Bld: 101 mg/dL — ABNORMAL HIGH (ref 70–99)
Potassium: 4.7 mmol/L (ref 3.5–5.1)
Sodium: 140 mmol/L (ref 135–145)
Total Bilirubin: 0.7 mg/dL (ref 0.0–1.2)
Total Protein: 6.7 g/dL (ref 6.5–8.1)

## 2023-09-20 MED ORDER — DICYCLOMINE HCL 20 MG PO TABS: 20.0000 mg | ORAL_TABLET | Freq: Two times a day (BID) | ORAL | 0 refills | Status: DC | PRN

## 2023-09-20 MED ORDER — ONDANSETRON 4 MG PO TBDP: 4.0000 mg | ORAL_TABLET | Freq: Three times a day (TID) | ORAL | 0 refills | Status: DC | PRN

## 2023-09-20 MED ORDER — ONDANSETRON HCL 4 MG/2ML IJ SOLN
4.0000 mg | Freq: Once | INTRAMUSCULAR | Status: AC
  Administered 2023-09-20: 4 mg via INTRAVENOUS
  Filled 2023-09-20: qty 2

## 2023-09-20 MED ORDER — SODIUM CHLORIDE 0.9 % IV BOLUS
1000.0000 mL | Freq: Once | INTRAVENOUS | Status: AC
  Administered 2023-09-20: 1000 mL via INTRAVENOUS

## 2023-09-20 MED ORDER — IOHEXOL 300 MG/ML  SOLN
100.0000 mL | Freq: Once | INTRAMUSCULAR | Status: AC | PRN
  Administered 2023-09-20: 100 mL via INTRAVENOUS

## 2023-09-20 MED ORDER — ONDANSETRON 4 MG PO TBDP
4.0000 mg | ORAL_TABLET | Freq: Three times a day (TID) | ORAL | 0 refills | Status: DC | PRN
  Filled 2023-09-20: qty 20, 7d supply, fill #0

## 2023-09-20 MED ORDER — MORPHINE SULFATE (PF) 4 MG/ML IV SOLN
4.0000 mg | Freq: Once | INTRAVENOUS | Status: AC
  Administered 2023-09-20: 4 mg via INTRAVENOUS
  Filled 2023-09-20: qty 1

## 2023-09-20 MED ORDER — DICYCLOMINE HCL 20 MG PO TABS
20.0000 mg | ORAL_TABLET | Freq: Two times a day (BID) | ORAL | 0 refills | Status: AC | PRN
  Filled 2023-09-20: qty 20, 10d supply, fill #0

## 2023-09-20 NOTE — Discharge Instructions (Addendum)
 As discussed, your laboratory studies as well as CT imaging grossly appeared normal.  You do some small kidney stones that are in your kidney not causing obstruction.  You also have 1 spot on your liver that is concerning for a benign hemangioma.  Suspect that your symptoms are likely secondary to viral process.  Will send you home with medicine for nausea, medicine for discomfort.  Recommend over-the-counter Imodium for persistent/excessive diarrhea.  Please not hesitate to return to emergency department if the worrisome signs and symptoms we discussed become apparent.

## 2023-09-20 NOTE — ED Provider Notes (Signed)
 [place] EMERGENCY DEPARTMENT AT [facility] Provider Note   CSN: [id number] Arrival date & time: 09/20/23  7829     History  Chief Complaint  Patient presents with   Emesis    Grant [name] is a 48 y.o. male.   Emesis   48 year old male presents emergency department with complaints of abdominal pain, nausea, vomiting, headache.  Patient reports symptoms beginning yesterday morning.  Patient reports crippling abdominal pain around his bellybutton that seems to come in waves.  States that he has had multiple episodes of vomiting since symptom onset.  States the pain currently is only mild.  Denies radiation of pain.  Denies any fevers, chills, hematemesis, urinary symptoms.  Does report 1 episode of looser bowel movements earlier today.  States he has a known exposure to someone with a GI illness in the outpatient setting but is concerned due to associated abdominal pain.  Patient does report headache frontal in nature without radiation.  States that he has been having this headache as he has been vomiting more.  Reports gradual onset with worsening since onset.  States that he feels like it could be related to him being dehydrated.  States that he was having some blurry vision yesterday when he was looking at the TV that is since resolved and was somewhat transient in nature.  Denies any weakness/sensory deficits in upper extremities, slurred speech, facial droop, gait abnormality.  Past medical history significant for atrial fibrillation on Eliquis, kidney stone, obesity, OSA  Home Medications Prior to Admission medications   Medication Sig Start Date End Date Taking? Authorizing Provider  apixaban (ELIQUIS) 5 MG TABS tablet Take 1 tablet (5 mg total) by mouth 2 (two) times daily. 07/05/23 07/04/24 Yes Monge, Petra Kuba, NP  clopidogrel (PLAVIX) 75 MG tablet Take 1 tablet (75 mg total) by mouth daily. 06/24/23 06/23/24 Yes Gillis Santa, MD  ezetimibe (ZETIA) 10 MG tablet Take  1 tablet (10 mg total) by mouth daily. 06/24/23 06/23/24 Yes Gillis Santa, MD  metoprolol succinate (TOPROL-XL) 25 MG 24 hr tablet Take 1 tablet (25 mg total) by mouth daily. 09/12/23  Yes Arnetha Courser, MD  pantoprazole (PROTONIX) 40 MG tablet Take 1 tablet (40 mg total) by mouth daily. 06/24/23 06/23/24 Yes Gillis Santa, MD  rosuvastatin (CRESTOR) 40 MG tablet Take 1 tablet (40 mg total) by mouth daily. 02/12/23  Yes Arnetha Courser, MD  dicyclomine (BENTYL) 20 MG tablet Take 1 tablet (20 mg total) by mouth 2 (two) times daily as needed. 09/20/23   Sherian Maroon A, PA  ondansetron (ZOFRAN-ODT) 4 MG disintegrating tablet Take 1 tablet (4 mg total) by mouth every 8 (eight) hours as needed. 09/20/23   Peter Garter, PA      Allergies    Patient has no known allergies.    Review of Systems   Review of Systems  Gastrointestinal:  Positive for vomiting.    Physical Exam Updated Vital Signs BP 106/67   Pulse 76   Temp 98.2 F (36.8 C) (Oral)   Resp 15   Ht 6' (1.829 m)   Wt (!) 156.5 kg   SpO2 99%   BMI 46.79 kg/m  Physical Exam Vitals and nursing note reviewed.  Constitutional:      General: He is not in acute distress.    Appearance: He is well-developed.  HENT:     Head: Normocephalic and atraumatic.  Eyes:     Conjunctiva/sclera: Conjunctivae normal.  Cardiovascular:     Rate and  Rhythm: Normal rate and regular rhythm.     Heart sounds: No murmur heard. Pulmonary:     Effort: Pulmonary effort is normal. No respiratory distress.     Breath sounds: Normal breath sounds.  Abdominal:     Palpations: Abdomen is soft.     Tenderness: There is abdominal tenderness.     Comments: Tenderness paramedical as well as suprapubic region.  Musculoskeletal:        General: No swelling.     Cervical back: Neck supple.  Skin:    General: Skin is warm and dry.     Capillary Refill: Capillary refill takes less than 2 seconds.  Neurological:     Mental Status: He is alert.   Psychiatric:        Mood and Affect: Mood normal.     ED Results / Procedures / Treatments   Labs (all labs ordered are listed, but only abnormal results are displayed) Labs Reviewed  COMPREHENSIVE METABOLIC PANEL - Abnormal; Notable for the following components:      Result Value   Glucose, Bld 101 (*)    Calcium 8.7 (*)    AST 14 (*)    All other components within normal limits  URINALYSIS, ROUTINE W REFLEX MICROSCOPIC - Abnormal; Notable for the following components:   Protein, ur TRACE (*)    All other components within normal limits  RESP PANEL BY RT-PCR (RSV, FLU A&B, COVID)  RVPGX2  CBC  LIPASE, BLOOD    EKG None  Radiology CT ABDOMEN PELVIS W CONTRAST Result Date: 09/20/2023 CLINICAL DATA:  Abdominal pain, acute, nonlocalized. EXAM: CT ABDOMEN AND PELVIS WITH CONTRAST TECHNIQUE: Multidetector CT imaging of the abdomen and pelvis was performed using the standard protocol following bolus administration of intravenous contrast. RADIATION DOSE REDUCTION: This exam was performed according to the departmental dose-optimization program which includes automated exposure control, adjustment of the mA and/or kV according to patient size and/or use of iterative reconstruction technique. CONTRAST:  OMNIPAQUE IOHEXOL 300 MG/ML  SOLN COMPARISON:  CT scan renal stone protocol from 03/22/23. FINDINGS: Lower chest: The lung bases are clear. No pleural effusion. The heart is normal in size. No pericardial effusion. There are coronary artery atherosclerotic calcifications, in keeping with coronary artery disease. Hepatobiliary: The liver is normal in size. Non-cirrhotic configuration. There is an ill-defined subcapsular hypoattenuating lesion in the right hepatic lobe, segment 7 measured approximately 5.5 x 7.5 cm on today's exam. The lesion is incompletely characterized on the current examination and appears grossly unchanged since the prior study dating back to 04/29/2022. This is favored  to represent a hemangioma however, has never been characterized with multiphasic exam. Further evaluation with nonemergent MRI abdomen as per liver mass protocol is recommended. No intrahepatic or extrahepatic bile duct dilation. No calcified gallstones. Normal gallbladder wall thickness. No pericholecystic inflammatory changes. Pancreas: Unremarkable. No pancreatic ductal dilatation or surrounding inflammatory changes. Spleen: Within normal limits. No focal lesion. Adrenals/Urinary Tract: Adrenal glands are unremarkable. No suspicious renal mass. There are 3, 1-1.5 mm nonobstructing calculi in the left kidney. No other nephroureterolithiasis on either side. No hydroureteronephrosis. Urinary bladder is under distended, precluding optimal assessment. However, no large mass or stones identified. No perivesical fat stranding. Stomach/Bowel: There is a small sliding hiatal hernia. No disproportionate dilation of the small or large bowel loops. No evidence of abnormal bowel wall thickening or inflammatory changes. The appendix is unremarkable. Vascular/Lymphatic: No ascites or pneumoperitoneum. No abdominal or pelvic lymphadenopathy, by size criteria. No aneurysmal dilation  of the major abdominal arteries. There are mild peripheral atherosclerotic vascular calcifications of the aorta and its major branches. Reproductive: Normal size prostate. Symmetric seminal vesicles. Other: There is a tiny fat containing umbilical hernia. The soft tissues and abdominal wall are otherwise unremarkable. Musculoskeletal: No suspicious osseous lesions. There are mild multilevel degenerative changes in the visualized spine. IMPRESSION: 1. No acute inflammatory process identified within the abdomen or pelvis. No bowel obstruction. 2. There are 3, 1-1.5 mm nonobstructing calculi in the left kidney. No other nephroureterolithiasis on either side. No hydroureteronephrosis. 3. There is an ill-defined subcapsular hypoattenuating lesion in the  right hepatic lobe, segment 7 measured approximately 5.5 x 7.5 cm on today's exam. The lesion is incompletely characterized on the current examination and appears grossly unchanged since the prior study dating back to 04/29/2022. This is favored to represent a hemangioma however, has never been characterized with multiphasic exam. Further evaluation with nonemergent MRI abdomen as per liver mass protocol is recommended. 4. Multiple other nonacute observations, as described above. Aortic Atherosclerosis (ICD10-I70.0). Electronically Signed   By: Jules Schick M.D.   On: 09/20/2023 11:52    Procedures Procedures    Medications Ordered in ED Medications  sodium chloride 0.9 % bolus 1,000 mL (0 mLs Intravenous Stopped 09/20/23 1033)  ondansetron (ZOFRAN) injection 4 mg (4 mg Intravenous Given 09/20/23 0923)  morphine (PF) 4 MG/ML injection 4 mg (4 mg Intravenous Given 09/20/23 0923)  iohexol (OMNIPAQUE) 300 MG/ML solution 100 mL (100 mLs Intravenous Contrast Given 09/20/23 1044)    ED Course/ Medical Decision Making/ A&P                                 Medical Decision Making Amount and/or Complexity of Data Reviewed Labs: ordered. Radiology: ordered.  Risk Prescription drug management.   This patient presents to the ED for concern of abdominal pain, vomiting, diarrhea, this involves an extensive number of treatment options, and is a complaint that carries with it a high risk of complications and morbidity.  The differential diagnosis includes diverticulitis, appendicitis, SBO/LBO, volvulus, pyelonephritis, nephrolithiasis, cystitis, CBD pathology, cholecystitis, gastritis, PUD, viral gastroenteritis other   Co morbidities that complicate the patient evaluation  See HPI   Additional history obtained:  Additional history obtained from EMR External records from outside source obtained and reviewed including hospital records   Lab Tests:  I Ordered, and personally interpreted labs.   The pertinent results include: No leukocytosis.  No evidence of anemia.  Platelets within range.  Mild hypocalcemia of 8.7 otherwise, electrolytes within normal limits.  No transaminitis.  No renal dysfunction.  Viral testing negative.  UA with trace protein but otherwise unremarkable.  Lipase within normal limits.   Imaging Studies ordered:  I ordered imaging studies including CT abdomen pelvis I independently visualized and interpreted imaging which showed acute abnormality.  3 nonobstructing calculi left kidney.  Subcapsular reported.  Lesion right hepatic lobe.  Aortic atherosclerosis. I agree with the radiologist interpretation   Cardiac Monitoring: / EKG:  The patient was maintained on a cardiac monitor.  I personally viewed and interpreted the cardiac monitored which showed an underlying rhythm of: Sinus   Consultations Obtained:  N/a   Problem List / ED Course / Critical interventions / Medication management  Nausea, vomiting, diarrhea, abdominal pain I ordered medication including Zofran, morphine, normal saline   Reevaluation of the patient after these medicines showed that the patient improved I  have reviewed the patients home medicines and have made adjustments as needed   Social Determinants of Health:  Chews tobacco.  Denies illicit drug use.   Test / Admission - Considered:  Nausea, vomiting, diarrhea, abdominal pain Vitals signs within normal range and stable throughout visit. Laboratory/imaging studies significant for: See above 48 year old male presents emergency pain, nausea, vomiting, headache.  Symptom onset yesterday morning.  On exam, tender to palpation periumbilically.  Nonfocal neuroexam.  Laboratory studies reassuring without acute emergent process.  CT imaging obtained given patient's abdominal tenderness which did not show evidence of any acute abnormality.  Patient treated with IV fluids as well as medication for pain/nausea and noted resolution of  symptoms.  Regarding headache, gradual onset with worsening since onset.  Nonfocal neuroexam.  Low suspicion for CVA/SAH, carotid artery/vertebral artery dissection, ceruminous thrombosis.  Headache completely resolved with medications administered while in the ED; suspect more migraine type headache.  Regarding abdominal pain, nausea, vomiting, patient with known exposure to individual GI illness prior to symptom onset.  Laboratory studies reassuring.  CT imaging without acute emergent process.  Suspect viral etiology of patient's symptoms.  Will recommend continued oral hydration, antiemetic as needed and follow-up with PCP in the outpatient setting.  No indication for antibiotics at this time.  Treatment plan discussed length with patient and he acknowledged understanding was agreeable to said plan.  Patient overall well-appearing, afebrile in no acute distress.. Worrisome signs and symptoms were discussed with the patient, and the patient acknowledged understanding to return to the ED if noticed. Patient was stable upon discharge.          Final Clinical Impression(s) / ED Diagnoses Final diagnoses:  Nausea and vomiting, unspecified vomiting type  Periumbilical abdominal pain    Rx / DC Orders ED Discharge Orders          Ordered    ondansetron (ZOFRAN-ODT) 4 MG disintegrating tablet  Every 8 hours PRN,   Status:  Discontinued        09/20/23 1212    dicyclomine (BENTYL) 20 MG tablet  2 times daily PRN,   Status:  Discontinued        09/20/23 1212    dicyclomine (BENTYL) 20 MG tablet  2 times daily PRN        09/20/23 1220    ondansetron (ZOFRAN-ODT) 4 MG disintegrating tablet  Every 8 hours PRN        09/20/23 1220              Peter Garter, [place] 09/20/23 1736    Margarita Grizzle, MD 10/01/23 1054

## 2023-09-20 NOTE — ED Triage Notes (Signed)
 In for eval of nausea, vomiting, headache, and abd pain since yesterday morning. Sick contact on Saturday. Started having diarrhea this am. Denies dysuria, cough, fever, or chills.

## 2023-10-06 ENCOUNTER — Encounter (HOSPITAL_BASED_OUTPATIENT_CLINIC_OR_DEPARTMENT_OTHER): Payer: Self-pay | Admitting: *Deleted

## 2023-10-06 ENCOUNTER — Other Ambulatory Visit: Payer: Self-pay

## 2023-10-06 ENCOUNTER — Emergency Department (HOSPITAL_BASED_OUTPATIENT_CLINIC_OR_DEPARTMENT_OTHER): Payer: Self-pay | Admitting: Radiology

## 2023-10-06 DIAGNOSIS — M545 Low back pain, unspecified: Secondary | ICD-10-CM | POA: Insufficient documentation

## 2023-10-06 DIAGNOSIS — Z7901 Long term (current) use of anticoagulants: Secondary | ICD-10-CM | POA: Insufficient documentation

## 2023-10-06 DIAGNOSIS — X501XXA Overexertion from prolonged static or awkward postures, initial encounter: Secondary | ICD-10-CM | POA: Insufficient documentation

## 2023-10-06 NOTE — ED Triage Notes (Signed)
 Pt was bending over to pick something up this am and felt a pop in his lower back and has been having difficulty straightening back out and has continued to have right lower back pain with spasms and no radiation.  No numbness or weakness, no incontinence with this.

## 2023-10-07 ENCOUNTER — Encounter (HOSPITAL_BASED_OUTPATIENT_CLINIC_OR_DEPARTMENT_OTHER): Payer: Self-pay | Admitting: Emergency Medicine

## 2023-10-07 ENCOUNTER — Emergency Department (HOSPITAL_BASED_OUTPATIENT_CLINIC_OR_DEPARTMENT_OTHER)
Admission: EM | Admit: 2023-10-07 | Discharge: 2023-10-07 | Disposition: A | Discharge status: Home / Self Care | Payer: Self-pay | Attending: Emergency Medicine | Admitting: Emergency Medicine

## 2023-10-07 DIAGNOSIS — M62830 Muscle spasm of back: Secondary | ICD-10-CM

## 2023-10-07 MED ORDER — DICLOFENAC SODIUM ER 100 MG PO TB24: 100.0000 mg | ORAL_TABLET | Freq: Every day | ORAL | 0 refills | Status: DC

## 2023-10-07 MED ORDER — LIDOCAINE 5 % EX PTCH
3.0000 | MEDICATED_PATCH | CUTANEOUS | Status: DC
  Administered 2023-10-07: 3 via TRANSDERMAL
  Filled 2023-10-07 (×2): qty 3

## 2023-10-07 MED ORDER — LIDOCAINE 5 % EX PTCH: 1.0000 | MEDICATED_PATCH | CUTANEOUS | 0 refills | Status: AC

## 2023-10-07 MED ORDER — KETOROLAC TROMETHAMINE 30 MG/ML IJ SOLN
30.0000 mg | Freq: Once | INTRAMUSCULAR | Status: AC
  Administered 2023-10-07: 30 mg via INTRAMUSCULAR
  Filled 2023-10-07: qty 1

## 2023-10-07 MED ORDER — METHOCARBAMOL 500 MG PO TABS: 500.0000 mg | ORAL_TABLET | Freq: Two times a day (BID) | ORAL | 0 refills | Status: DC

## 2023-10-07 NOTE — ED Provider Notes (Signed)
 [place] EMERGENCY DEPARTMENT AT [facility] Provider Note   CSN: [id number] Arrival date & time: 10/06/23  2020     History  Chief Complaint  Patient presents with   Back Pain    Grant [name] is a 48 y.o. male.  The history is provided by the patient.  Back Pain Location:  Lumbar spine Quality:  Cramping Radiates to:  Does not radiate Pain severity:  Severe Pain is:  Same all the time Onset quality:  Sudden Duration:  1 day Timing:  Constant Progression:  Unchanged Chronicity:  New Context comment:  Bending over and felt a pop Relieved by:  Nothing Worsened by:  Nothing Associated symptoms: no fever, no numbness, no weakness and no weight loss   Risk factors: no hx of cancer        Home Medications Prior to Admission medications   Medication Sig Start Date End Date Taking? Authorizing Provider  Diclofenac Sodium CR 100 MG 24 hr tablet Take 1 tablet (100 mg total) by mouth daily. 10/07/23  Yes Dequavion Follette, MD  lidocaine (LIDODERM) 5 % Place 1 patch onto the skin daily. Remove & Discard patch within 12 hours or as directed by MD 10/07/23  Yes Tasman Zapata, MD  methocarbamol (ROBAXIN) 500 MG tablet Take 1 tablet (500 mg total) by mouth 2 (two) times daily. 10/07/23  Yes Zeola Brys, MD  apixaban (ELIQUIS) 5 MG TABS tablet Take 1 tablet (5 mg total) by mouth 2 (two) times daily. 07/05/23 07/04/24  Joylene Grapes, NP  clopidogrel (PLAVIX) 75 MG tablet Take 1 tablet (75 mg total) by mouth daily. 06/24/23 06/23/24  Gillis Santa, MD  dicyclomine (BENTYL) 20 MG tablet Take 1 tablet (20 mg total) by mouth 2 (two) times daily as needed. 09/20/23   Peter Garter, PA  ezetimibe (ZETIA) 10 MG tablet Take 1 tablet (10 mg total) by mouth daily. 06/24/23 06/23/24  Gillis Santa, MD  metoprolol succinate (TOPROL-XL) 25 MG 24 hr tablet Take 1 tablet (25 mg total) by mouth daily. 09/12/23   Arnetha Courser, MD  ondansetron (ZOFRAN-ODT) 4 MG disintegrating tablet Take  1 tablet (4 mg total) by mouth every 8 (eight) hours as needed. 09/20/23   Peter Garter, PA  pantoprazole (PROTONIX) 40 MG tablet Take 1 tablet (40 mg total) by mouth daily. 06/24/23 06/23/24  Gillis Santa, MD  rosuvastatin (CRESTOR) 40 MG tablet Take 1 tablet (40 mg total) by mouth daily. 02/12/23   Arnetha Courser, MD      Allergies    Patient has no known allergies.    Review of Systems   Review of Systems  Constitutional:  Negative for fever and weight loss.  Genitourinary:  Negative for difficulty urinating and hematuria.  Musculoskeletal:  Positive for back pain.  Neurological:  Negative for weakness and numbness.  All other systems reviewed and are negative.   Physical Exam Updated Vital Signs BP 122/69 (BP Location: Right Arm)   Pulse 70   Temp 98.4 F (36.9 C)   Resp 18   SpO2 97%  Physical Exam Vitals and nursing note reviewed.  Constitutional:      General: He is not in acute distress.    Appearance: He is well-developed. He is not diaphoretic.  HENT:     Head: Normocephalic and atraumatic.     Nose: Nose normal.  Eyes:     Conjunctiva/sclera: Conjunctivae normal.     Pupils: Pupils are equal, round, and reactive to light.  Cardiovascular:  Rate and Rhythm: Normal rate and regular rhythm.  Pulmonary:     Effort: Pulmonary effort is normal.     Breath sounds: Normal breath sounds. No wheezing or rales.  Abdominal:     General: Bowel sounds are normal.     Palpations: Abdomen is soft.     Tenderness: There is no abdominal tenderness. There is no guarding or rebound.  Musculoskeletal:        General: Normal range of motion.       Arms:     Cervical back: Normal, normal range of motion and neck supple.     Thoracic back: Normal.     Lumbar back: Normal.  Skin:    General: Skin is warm and dry.     Capillary Refill: Capillary refill takes less than 2 seconds.  Neurological:     General: No focal deficit present.     Mental Status: He is alert and  oriented to person, place, and time.     Deep Tendon Reflexes: Reflexes normal.  Psychiatric:        Mood and Affect: Mood normal.        Behavior: Behavior normal.     ED Results / Procedures / Treatments   Labs (all labs ordered are listed, but only abnormal results are displayed) Labs Reviewed - No data to display  EKG None  Radiology DG Lumbar Spine Complete Result Date: 10/06/2023 CLINICAL DATA:  Low back pain.  Felt a pop. EXAM: LUMBAR SPINE - COMPLETE 4+ VIEW COMPARISON:  None Available. FINDINGS: Five non-rib-bearing lumbar vertebra. The alignment is maintained. Vertebral body heights are normal. There is no listhesis. The posterior elements are intact. Disc spaces are preserved. No fracture, pars defects or focal bone abnormality. Sacroiliac joints are symmetric and normal. IMPRESSION: Negative radiographs of the lumbar spine. Electronically Signed   By: Narda Rutherford M.D.   On: 10/06/2023 23:42    Procedures Procedures    Medications Ordered in ED Medications  lidocaine (LIDODERM) 5 % 3 patch (3 patches Transdermal Patch Applied 10/07/23 0116)  ketorolac (TORADOL) 30 MG/ML injection 30 mg (30 mg Intramuscular Given 10/07/23 0112)    ED Course/ Medical Decision Making/ A&P                                 Medical Decision Making Patient with back pain since bending over, heard a pop   Amount and/or Complexity of Data Reviewed Independent Historian: spouse    Details: See above  External Data Reviewed: notes.    Details: Previous notes reviewed  Radiology: ordered and independent interpretation performed.    Details: No fractures   Risk Prescription drug management. Risk Details: Palpable spasm.  I do not believe this is a kidney stone given history and exam.  Xray is negative.  Medication provided and RX sent to pharmacy. Stable for discharge.  Strict returns     Final Clinical Impression(s) / ED Diagnoses Final diagnoses:  None   No signs of systemic  illness or infection. The patient is nontoxic-appearing on exam and vital signs are within normal limits.  I have reviewed the triage vital signs and the nursing notes. Pertinent labs & imaging results that were available during my care of the patient were reviewed by me and considered in my medical decision making (see chart for details). After history, exam, and medical workup I feel the patient has been appropriately medically screened and  is safe for discharge home. Pertinent diagnoses were discussed with the patient. Patient was given return precautions.  Rx / DC Orders ED Discharge Orders          Ordered    Diclofenac Sodium CR 100 MG 24 hr tablet  Daily        10/07/23 0121    lidocaine (LIDODERM) 5 %  Every 24 hours        10/07/23 0121    methocarbamol (ROBAXIN) 500 MG tablet  2 times daily        10/07/23 0121              Selena Swaminathan, MD 10/07/23 0126

## 2023-10-21 ENCOUNTER — Ambulatory Visit: Payer: Self-pay | Attending: Nurse Practitioner | Admitting: Nurse Practitioner

## 2023-10-21 NOTE — Progress Notes (Deleted)
 Office Visit    Patient Name: Grant [name] Date of Encounter: 10/21/2023  Primary Care Provider:  Patient, No Pcp Per Primary Cardiologist:  Thurmon Fair, MD  Chief Complaint    48 year old male with a history of CAD s/p PCI with DES-RCA in 01/2023, paroxysmal atrial fibrillation s/p TEE guided DCCV in 05/2023, mitral valve regurgitation, aortic root dilation, hyperlipidemia, suspected OSA, and obesity who presents for follow-up related to atrial fibrillation.   Past Medical History    Past Medical History:  Diagnosis Date   Atrial fibrillation (HCC)    Kidney stones    Past Surgical History:  Procedure Laterality Date   APPENDECTOMY     CARDIOVERSION N/A 06/22/2023   Procedure: CARDIOVERSION;  Surgeon: Antonieta Iba, MD;  Location: [facility] ORS;  Service: Cardiovascular;  Laterality: N/A;   CORONARY STENT INTERVENTION N/A 02/10/2023   Procedure: CORONARY STENT INTERVENTION;  Surgeon: Marykay Lex, MD;  Location: [facility] INVASIVE CV [facility];  Service: Cardiovascular;  Laterality: N/A;   LEFT HEART CATH AND CORONARY ANGIOGRAPHY N/A 02/10/2023   Procedure: LEFT HEART CATH AND CORONARY ANGIOGRAPHY;  Surgeon: Marykay Lex, MD;  Location: [facility] INVASIVE CV [facility];  Service: Cardiovascular;  Laterality: N/A;   TEE WITHOUT CARDIOVERSION N/A 06/22/2023   Procedure: TRANSESOPHAGEAL ECHOCARDIOGRAM (TEE);  Surgeon: Antonieta Iba, MD;  Location: [facility] ORS;  Service: Cardiovascular;  Laterality: N/A;   TONSILLECTOMY      Allergies  No Known Allergies   Labs/Other Studies Reviewed    The following studies were reviewed today: *** Cardiac Studies & Procedures   ______________________________________________________________________________________________ CARDIAC CATHETERIZATION  CARDIAC CATHETERIZATION 02/10/2023  Narrative   Prox RCA lesion is 15% stenosed.   CULPRIT LESION mid RCA lesion is 90% stenosed. ->  Heavily fibrous focal concentric.  TIMI-3 flow   After scoring  balloon angioplasty was performed using a BALLN SCOREFLEX 3.50X15, A drug-eluting stent was successfully placed using a STENT ONYX FRONTIER 3.5X22.  The stent was then postdilated and tapered fashion from 4.1 to 4.0 mm.  Post intervention, there is a 0% residual stenosis. TIMI-3 flow post   Prox Cx to Mid Cx lesion is 50% stenosed.   Ost LAD to Prox LAD lesion is 15% stenosed.   The Left Ventricular systolic function is normal. The Left Ventricular Ejection Fraction is 55-65% by visual estimate.   LV end diastolic pressure is normal.   Successful complex ScoreFlex PTCA based angioplasty and DES PCI of CULPRIT LESION (mid RCA fibrous 90%) with Onyx Frontier DES 3.5 mm x 22 mm postdilated tapered fashion from 4.1 to 4.0 mm.   RECOMMENDATIONS Overnight monitoring with TR band removal and gentle hydration. Continue to titrate GDMT for CAD-PCI   In the absence of any other complications or medical issues, we expect the patient to be ready for discharge from an interventional cardiology perspective on 02/11/2023.   Recommend uninterrupted dual antiplatelet therapy with Aspirin 81mg  daily and Ticagrelor 90mg  twice daily for a minimum of 12 months (ACS-Class I recommendation). [name], MD  Findings Coronary Findings Diagnostic  Dominance: Right  Left Main Vessel was injected. Vessel is normal in caliber. Vessel is angiographically normal.  Left Anterior Descending Vessel was injected. Vessel is large. The vessel exhibits minimal luminal irregularities. Ost LAD to Prox LAD lesion is 15% stenosed.  Left Circumflex Vessel was injected. Vessel is large. Vessel is angiographically normal. The vessel is ectatic. Prox Cx to Mid Cx lesion is 50% stenosed.  Right Coronary Artery Vessel was  injected. Vessel is large. There is mild diffuse disease throughout the vessel. There is severe focal disease in the vessel. The vessel is mildly calcified. Prox RCA lesion is 15% stenosed. Mid RCA  lesion is 90% stenosed. Vessel is the culprit lesion. The lesion is type B1, located at the bend and concentric. The lesion is mildly calcified. Dense fibrous  Acute Marginal Branch Vessel is small in size.  Right Ventricular Branch Vessel is small in size.  Right Posterior Descending Artery Vessel is small in size.  First Right Posterolateral Branch Vessel is small in size.  Second Right Posterolateral Branch Vessel is small in size.  Third Right Posterolateral Branch Vessel is small in size.  Intervention  Mid RCA lesion Angioplasty Lesion length:  18 mm. CATH GUIDE ADROIT 6FR AL.75 guide catheter was inserted. WIRE ASAHI PROWATER 180CM guidewire used to cross lesion. Scoring balloon angioplasty was performed using a BALLN SCOREFLEX 3.50X15. Maximum pressure: 12 atm. Inflation time: 20 sec. After several attempts with standard balloons not staying in place, chose to use ScoreFlex balloon whichwas able to fully expand the fibrous lesion. Stent Lesion length:  18 mm. CATH GUIDE ADROIT 6FR AL.75 guide catheter was inserted. Lesion crossed with guidewire using a WIRE ASAHI PROWATER 180CM. Pre-stent angioplasty was performed using a BALLN EUPHORA RX 3.0X15. Maximum pressure:  12 atm. Inflation time:  20 sec. After 2 standard balloons, the score flex was used. BALLN EUPHORA RX 2.5X12 - 10 Atm x 20 Sec A drug-eluting stent was successfully placed using a STENT ONYX FRONTIER 3.5X22. Maximum pressure: 16 atm. Inflation time: 30 sec. Stent strut is well apposed. Post-stent angioplasty was performed using a BALLN [place] TREK NEO RX 4.0X15. Maximum pressure:  16 atm. Inflation time:  20 sec. Postdilated from 4.1 to 4.0 mm -> 3 separate inflations A slightly longer send initially anticipated was placed because initially the balloon was watermelon seeding with inflation, therefore I need to cover the angioplastied area. Post-Intervention Lesion Assessment The intervention was successful.  Pre-interventional TIMI flow is 3. Post-intervention TIMI flow is 3. Treated lesion length:  22 mm. No complications occurred at this lesion. There is a 0% residual stenosis post intervention.     ECHOCARDIOGRAM  ECHOCARDIOGRAM COMPLETE 06/21/2023  Narrative ECHOCARDIOGRAM REPORT    Patient Name:   Grant [name] Date of Exam: 06/21/2023 Medical Rec #:  [id number]      Height:       73.0 in Accession #:    [id number]     Weight:       345.0 lb Date of Birth:  [birth year]      BSA:          2.713 m Patient Age:    47 years       BP:           102/53 mmHg Patient Gender: M              HR:           102 bpm. Exam Location:  [facility]  Procedure: 2D Echo, Cardiac Doppler, Color Doppler and Intracardiac Opacification Agent  Indications:     I48.91 Atrial Fibrillation  History:         Patient has prior history of Echocardiogram examinations, most recent 02/11/2023.  Sonographer:     Daphine Deutscher RDCS Referring Phys:  [id number] Gillis Santa Diagnosing Phys: Julien Nordmann MD  IMPRESSIONS   1. Left ventricular ejection fraction, by estimation, is 55 to 60%. The left ventricle has  normal function. The left ventricle has no regional wall motion abnormalities. There is mild left ventricular hypertrophy. Left ventricular diastolic parameters are indeterminate. 2. Right ventricular systolic function is normal. The right ventricular size is normal. 3. The mitral valve is normal in structure. Mild mitral valve regurgitation. No evidence of mitral stenosis. 4. The aortic valve is normal in structure. Aortic valve regurgitation is not visualized. No aortic stenosis is present. 5. There is mild dilatation of the aortic root, measuring 43 mm. 6. The inferior vena cava is normal in size with greater than 50% respiratory variability, suggesting right atrial pressure of 3 mmHg.  FINDINGS Left Ventricle: Left ventricular ejection fraction, by estimation, is 55 to 60%. The left ventricle has  normal function. The left ventricle has no regional wall motion abnormalities. Definity contrast agent was given IV to delineate the left ventricular endocardial borders. The left ventricular internal cavity size was normal in size. There is mild left ventricular hypertrophy. Left ventricular diastolic parameters are indeterminate.  Right Ventricle: The right ventricular size is normal. No increase in right ventricular wall thickness. Right ventricular systolic function is normal.  Left Atrium: Left atrial size was normal in size.  Right Atrium: Right atrial size was normal in size.  Pericardium: There is no evidence of pericardial effusion.  Mitral Valve: The mitral valve is normal in structure. Mild mitral valve regurgitation. No evidence of mitral valve stenosis.  Tricuspid Valve: The tricuspid valve is normal in structure. Tricuspid valve regurgitation is mild . No evidence of tricuspid stenosis.  Aortic Valve: The aortic valve is normal in structure. Aortic valve regurgitation is not visualized. No aortic stenosis is present.  Pulmonic Valve: The pulmonic valve was normal in structure. Pulmonic valve regurgitation is not visualized. No evidence of pulmonic stenosis.  Aorta: The aortic root is normal in size and structure. There is mild dilatation of the aortic root, measuring 43 mm.  Venous: The inferior vena cava is normal in size with greater than 50% respiratory variability, suggesting right atrial pressure of 3 mmHg.  IAS/Shunts: No atrial level shunt detected by color flow Doppler.   LEFT VENTRICLE PLAX 2D LVIDd:         5.70 cm LVIDs:         3.30 cm LV PW:         1.40 cm LV IVS:        1.20 cm LVOT diam:     2.50 cm LV SV:         85 LV SV Index:   31 LVOT Area:     4.91 cm   RIGHT VENTRICLE RV Basal diam:  4.90 cm RV S prime:     12.57 cm/s TAPSE (M-mode): 2.0 cm  LEFT ATRIUM             Index        RIGHT ATRIUM           Index LA diam:        5.20 cm 1.92  cm/m   RA Area:     15.30 cm LA Vol (A2C):   68.9 ml 25.40 ml/m  RA Volume:   38.60 ml  14.23 ml/m LA Vol (A4C):   43.3 ml 15.96 ml/m LA Biplane Vol: 59.4 ml 21.90 ml/m AORTIC VALVE LVOT Vmax:   91.62 cm/s LVOT Vmean:  65.880 cm/s LVOT VTI:    0.172 m  AORTA Ao Root diam: 4.30 cm Ao Asc diam:  3.60 cm  MV E velocity:  89.92 cm/s  TRICUSPID VALVE TR Peak grad:   17.5 mmHg TR Vmax:        209.00 cm/s  SHUNTS Systemic VTI:  0.17 m Systemic Diam: 2.50 cm  Julien Nordmann MD Electronically signed by Julien Nordmann MD Signature Date/Time: 06/22/2023/7:45:16 AM    Final   TEE  ECHO TEE 06/22/2023  Narrative TRANSESOPHOGEAL ECHO REPORT    Patient Name:   Grant [name] Date of Exam: 06/22/2023 Medical Rec #:  [id number]      Height:       73.0 in Accession #:    [id number]     Weight:       345.0 lb Date of Birth:  Aug 01, 1975      BSA:          2.713 m Patient Age:    47 years       BP:           102/69 mmHg Patient Gender: M              HR:           85 bpm. Exam Location:  [facility]  Procedure: Transesophageal Echo, Cardiac Doppler, Color Doppler and Saline Contrast Bubble Study  Indications:     Atrial Fibrillation I48.91  History:         Patient has prior history of Echocardiogram examinations, most recent 06/22/2023. Risk Factors:Diabetes and Hypertension.  Sonographer:     Cristela Blue Referring Phys:  [id number] Antonieta Iba Diagnosing Phys: Julien Nordmann MD  PROCEDURE: After discussion of the risks and benefits of a TEE, an informed consent was obtained from the patient. TEE procedure time was 30 minutes. The transesophogeal probe was passed without difficulty through the esophogus of the patient. Local oropharyngeal anesthetic was provided with Cetacaine and viscous lidocaine. Sedation performed by different physician. Image quality was excellent. The patient's vital signs; including heart rate, blood pressure, and oxygen saturation; remained  stable throughout the procedure. The patient developed no complications during the procedure. A successful direct current cardioversion was performed at 200 joules with 1 attempt.  IMPRESSIONS   1. Left ventricular ejection fraction, by estimation, is 45 to 50%. The left ventricle has mildly decreased function. The left ventricle demonstrates global hypokinesis. 2. Right ventricular systolic function is mildly reduced. The right ventricular size is normal. 3. The mitral valve is normal in structure. No evidence of mitral valve regurgitation. No evidence of mitral stenosis. 4. The aortic valve is tricuspid. Aortic valve regurgitation is not visualized. No aortic stenosis is present. 5. The inferior vena cava is normal in size with greater than 50% respiratory variability, suggesting right atrial pressure of 3 mmHg. 6. rhythm is atrial fibrillation with RVR 7. Left atrial size was moderately dilated. No left atrial/left atrial appendage thrombus was detected. 8. Agitated saline contrast bubble study was positive with shunting observed within 3-6 cardiac cycles suggestive of interatrial shunt. There is a small patent foramen ovale with predominantly right to left shunting across the atrial septum.  Conclusion(s)/Recommendation(s): Normal biventricular function without evidence of hemodynamically significant valvular heart disease.  FINDINGS Left Ventricle: Left ventricular ejection fraction, by estimation, is 45 to 50%. The left ventricle has mildly decreased function. The left ventricle demonstrates global hypokinesis. The left ventricular internal cavity size was normal in size. There is no left ventricular hypertrophy.  Right Ventricle: The right ventricular size is normal. No increase in right ventricular wall thickness. Right ventricular systolic function is mildly reduced.  Left Atrium:  Left atrial size was moderately dilated. No left atrial/left atrial appendage thrombus was  detected.  Right Atrium: Right atrial size was normal in size.  Pericardium: There is no evidence of pericardial effusion.  Mitral Valve: The mitral valve is normal in structure. No evidence of mitral valve regurgitation. No evidence of mitral valve stenosis.  Tricuspid Valve: The tricuspid valve is normal in structure. Tricuspid valve regurgitation is mild . No evidence of tricuspid stenosis.  Aortic Valve: The aortic valve is tricuspid. Aortic valve regurgitation is not visualized. No aortic stenosis is present.  Pulmonic Valve: The pulmonic valve was normal in structure. Pulmonic valve regurgitation is not visualized. No evidence of pulmonic stenosis.  Aorta: The aortic root is normal in size and structure.  Venous: The inferior vena cava is normal in size with greater than 50% respiratory variability, suggesting right atrial pressure of 3 mmHg.  IAS/Shunts: No atrial level shunt detected by color flow Doppler. Agitated saline contrast was given intravenously to evaluate for intracardiac shunting. Agitated saline contrast bubble study was positive with shunting observed within 3-6 cardiac cycles suggestive of interatrial shunt. A small patent foramen ovale is detected with predominantly right to left shunting across the atrial septum.  Julien Nordmann MD Electronically signed by Julien Nordmann MD Signature Date/Time: 06/23/2023/10:31:00 AM    Final    CT SCANS  CT CORONARY MORPH W/CTA COR W/SCORE 11/12/2022  Addendum 11/16/2022 12:27 PM ADDENDUM REPORT: 11/16/2022 12:25  EXAM: OVER-READ INTERPRETATION  CT CHEST  The following report is an over-read performed by radiologist Dr. Curly Shores [facility] on 11/16/2022. This over-read does not include interpretation of cardiac or coronary anatomy or pathology. The coronary CTA interpretation by the cardiologist is attached.  COMPARISON:  None.  FINDINGS: Cardiovascular:  Findings discussed in the body of  the report.  Mediastinum/Nodes: No suspicious adenopathy identified. Imaged mediastinal structures are unremarkable.  Lungs/Pleura: Imaged lungs are clear. No pleural effusion or pneumothorax.  Upper Abdomen: Small hiatal hernia.  Musculoskeletal: No chest wall abnormality. Thoracic degenerative changes. No acute or significant osseous findings.  IMPRESSION: Small hiatal hernia. Otherwise no significant extracardiac incidental findings identified.   Electronically Signed By: Layla Maw M.D. On: 11/16/2022 12:25  Narrative CLINICAL DATA:  52M with chest pain  EXAM: Cardiac/Coronary CTA  TECHNIQUE: The patient was scanned on a [employer].  FINDINGS: A 100 kV prospective scan was triggered in the descending thoracic aorta at 111 HU's. Axial non-contrast 3 mm slices were carried out through the heart. The data set was analyzed on a dedicated work station and scored using the Agatson method. Gantry rotation speed was 250 msecs and collimation was .6 mm. No beta blockade and 0.8 mg of sl NTG was given. The 3D data set was reconstructed in 5% intervals of the 35-75% of the R-R cycle. Phases were analyzed on a dedicated work station using MPR, MIP and VRT modes. The patient received 100 cc of contrast.  Coronary Arteries:  Normal coronary origin.  Right dominance.  RCA is a large dominant artery that gives rise to PDA and PLA. Calcified plaque in proximal RCA causes 25-49% stenosis. Calcified plaque in mid RCA causes 50-69%. Calcified plaque in distal RCA causes 50-69% stenosis. RCA difficult to evaluate due to artifact, appears moderate stenosis in mid/distal RCA but cannot exclude more significant obstruction.  Left main is a large artery that gives rise to LAD and LCX arteries.  LAD is a large vessel. Calcified plaque in proximal LAD causes 25-49%  stenosis. Mixed plaque in mid LAD causes 25-49% stenosis.  LCX is a non-dominant artery that gives  rise to one large OM1 branch. Mixed plaque in proximal LCX causes 25-49% stenosis.  Other findings:  Left Ventricle: Normal size  Left Atrium: Mild enlargement  Pulmonary Veins: Normal configuration  Right Ventricle: Moderately dilated  Right Atrium: Mild enlargement  Cardiac valves: No calcifications  Thoracic aorta: Normal size  Pulmonary Arteries: Dilated main pulmonary artery measuring 31mm  Systemic Veins: Normal drainage  Pericardium: Normal thickness  IMPRESSION: 1. Coronary calcium score of 1078. This was 99th percentile for age and sex matched control.  2.  Normal coronary origin with right dominance.  3. Poor quality study. There is significant noise likely secondary to obesity, step artifacts from cardiac motion, and severe coronary calcifications. Recommend alternative imaging modality  4. RCA is difficult to evaluate, appears moderate stenosis in mid/distal RCA but cannot exclude more significant obstruction. Unable to send for CTFFR, was rejected due to artifact. Recommend alternative evaluation as above  5. Mild (25-49%) stenosis in proximal/mid LAD, proximal LCX, and proximal RCA  6.  Dilated main pulmonary artery measuring 31mm  CAD-RADS N Non-diagnostic study. Obstructive CAD can't be excluded. Alternative evaluation is recommended.  Electronically Signed: By: Epifanio Lesches M.D. On: 11/14/2022 15:54     ______________________________________________________________________________________________     Recent Labs: 02/10/2023: B Natriuretic Peptide 5.2 06/19/2023: TSH 1.527 06/23/2023: Magnesium 2.4 09/20/2023: ALT 19; BUN 7; Creatinine, Ser 0.88; Hemoglobin 14.7; Platelets 195; Potassium 4.7; Sodium 140  Recent Lipid Panel    Component Value Date/Time   CHOL 160 02/11/2023 0912   CHOL 163 11/19/2022 0812   TRIG 103 02/11/2023 0912   HDL 22 (L) 02/11/2023 0912   HDL 28 (L) 11/19/2022 0812   CHOLHDL 7.3 02/11/2023 0912   VLDL  21 02/11/2023 0912   LDLCALC 117 (H) 02/11/2023 0912   LDLCALC 119 (H) 11/19/2022 0454    History of Present Illness    48 year old male with the above past medical history including CAD s/p PCI with DES-RCA in 01/2023, paroxysmal atrial fibrillation s/p TEE guided DCCV in 05/2023, mitral valve regurgitation, aortic root dilation, hyperlipidemia, suspected OSA, and obesity.   Coronary CT angiogram in 10/2022 revealed coronary calcium score of 1078 (11 percentile), overall poor quality, interpreted showing moderate stenosis of the mid to distal RCA, likely underestimated.  He was hospitalized in July 2024 in the setting of sudden onset severe chest discomfort.  He underwent cardiac catheterization which revealed 90% mid RCA stenosis, s/p DES, 50% proximal and mid left circumflex stenosis, 50% ostial LAD stenosis.  Echocardiogram showed normal LV function, RWMA.  He was last seen in the office on 02/17/2023  and was stable from a cardiac standpoint.  He presented to the ED on 06/19/2023 with new onset atrial fibrillation with RVR.  Echocardiogram showed EF 55 to 60%, normal LV function, no RWMA, mild LVH, indeterminate diastolic parameters, normal LV systolic function, mild mitral valve regurgitation, mild dilation of the aortic root measuring 43 mm.  Ventricular rate proved difficult to control, and he ultimately underwent successful TEE guided cardioversion on 06/22/2023.  He was started on Eliquis.  Metoprolol was decreased to 25 mg daily in the setting of borderline low blood pressure.  Outpatient EP consultation was recommended.  He was transitioned from Brilinta to Plavix given need for anticoagulation.  He was last seen in the office on 07/01/2023 and was stable from a cardiac standpoint.  He was maintaining sinus rhythm.  He denies any recurrent palpitations.  He noted that his primary symptom that prompted his ED visit was fatigue. He returned to the ED on 09/02/2023 in the setting of palpitations.  EKG  showed sinus rhythm.  Troponin was negative.  Chest x-ray was unremarkable.  He presents today for follow-up.  Since his last visit  1. Persistent atrial fibrillation: S/p TEE guided DCCV in 05/2023. Maintaining NSR.  He denies any recent palpitations.  He is high risk for recurrent atrial fibrillation in the setting of obesity, CAD, OSA. Pending sleep study as below.  He is tolerating low-dose metoprolol.  Should he have recurrent atrial fibrillation, he would likely benefit from EP referral. Discussed home monitoring with Kardia mobile device.  Reviewed ED precautions.  Continue metoprolol, Eliquis.   2. CAD: S/p PCI with DES-RCA in 01/2023. Stable with no anginal symptoms. No indication for ischemic evaluation.  Continue Plavix, metoprolol, Crestor, Zetia.  No ASA in the setting of chronic DOAC therapy.   3. Hyperlipidemia: LDL was 117 in 01/2023.  He is due for repeat fasting lipids, LFTs, will update.  Continue Crestor, Zetia.   4. Aortic root dilation: Measured 43 mm on most recent echocardiogram.  Consider repeat CT chest aorta in 05/2024 for routine monitoring.   5. OSA:  He has still not had a sleep study, will reach out to our sleep coordinator to follow-up on this.    6. Obesity: Continue to encourage ongoing lifestyle modifications with diet and exercise.   7. Disposition: Follow-up in  Home Medications    Current Outpatient Medications  Medication Sig Dispense Refill   apixaban (ELIQUIS) 5 MG TABS tablet Take 1 tablet (5 mg total) by mouth 2 (two) times daily. 60 tablet 11   clopidogrel (PLAVIX) 75 MG tablet Take 1 tablet (75 mg total) by mouth daily. 30 tablet 11   Diclofenac Sodium CR 100 MG 24 hr tablet Take 1 tablet (100 mg total) by mouth daily. 7 tablet 0   dicyclomine (BENTYL) 20 MG tablet Take 1 tablet (20 mg total) by mouth 2 (two) times daily as needed. 20 tablet 0   ezetimibe (ZETIA) 10 MG tablet Take 1 tablet (10 mg total) by mouth daily. 30 tablet 11   lidocaine  (LIDODERM) 5 % Place 1 patch onto the skin daily. Remove & Discard patch within 12 hours or as directed by MD 30 patch 0   methocarbamol (ROBAXIN) 500 MG tablet Take 1 tablet (500 mg total) by mouth 2 (two) times daily. 14 tablet 0   metoprolol succinate (TOPROL-XL) 25 MG 24 hr tablet Take 1 tablet (25 mg total) by mouth daily. 90 tablet 3   ondansetron (ZOFRAN-ODT) 4 MG disintegrating tablet Take 1 tablet (4 mg total) by mouth every 8 (eight) hours as needed. 20 tablet 0   pantoprazole (PROTONIX) 40 MG tablet Take 1 tablet (40 mg total) by mouth daily. 30 tablet 11   rosuvastatin (CRESTOR) 40 MG tablet Take 1 tablet (40 mg total) by mouth daily. 90 tablet 1   No current facility-administered medications for this visit.     Review of Systems    ***.  All other systems reviewed and are otherwise negative except as noted above.    Physical Exam    VS:  There were no vitals taken for this visit. , BMI There is no height or weight on file to calculate BMI. STOP-Bang Score:  7  { Consider Dx Sleep Disordered Breathing or Sleep Apnea  ICD G47.33          :  1}    GEN: Well nourished, well developed, in no acute distress. HEENT: normal. Neck: Supple, no JVD, carotid bruits, or masses. Cardiac: RRR, no murmurs, rubs, or gallops. No clubbing, cyanosis, edema.  Radials/DP/PT 2+ and equal bilaterally.  Respiratory:  Respirations regular and unlabored, clear to auscultation bilaterally. GI: Soft, nontender, nondistended, BS + x 4. MS: no deformity or atrophy. Skin: warm and dry, no rash. Neuro:  Strength and sensation are intact. Psych: Normal affect.  Accessory Clinical Findings    ECG personally reviewed by me today -    - no acute changes.   Lab Results  Component Value Date   WBC 5.4 09/20/2023   HGB 14.7 09/20/2023   HCT 46.6 09/20/2023   MCV 87.1 09/20/2023   PLT 195 09/20/2023   Lab Results  Component Value Date   CREATININE 0.88 09/20/2023   BUN 7 09/20/2023   NA 140  09/20/2023   K 4.7 09/20/2023   CL 106 09/20/2023   CO2 29 09/20/2023   Lab Results  Component Value Date   ALT 19 09/20/2023   AST 14 (L) 09/20/2023   ALKPHOS 55 09/20/2023   BILITOT 0.7 09/20/2023   Lab Results  Component Value Date   CHOL 160 02/11/2023   HDL 22 (L) 02/11/2023   LDLCALC 117 (H) 02/11/2023   TRIG 103 02/11/2023   CHOLHDL 7.3 02/11/2023    No results found for: "HGBA1C"  Assessment & Plan    1.  ***  No BP recorded.  {Refresh Note OR Click here to enter BP  :1}***   Joylene Grapes, NP 10/21/2023, 6:36 AM

## 2023-11-08 ENCOUNTER — Encounter: Payer: Self-pay | Admitting: General Practice

## 2023-12-27 ENCOUNTER — Emergency Department
Admission: EM | Admit: 2023-12-27 | Discharge: 2023-12-27 | Disposition: A | Discharge status: Home / Self Care | Payer: Self-pay | Attending: Emergency Medicine | Admitting: Emergency Medicine

## 2023-12-27 DIAGNOSIS — S39012A Strain of muscle, fascia and tendon of lower back, initial encounter: Secondary | ICD-10-CM | POA: Insufficient documentation

## 2023-12-27 DIAGNOSIS — I2510 Atherosclerotic heart disease of native coronary artery without angina pectoris: Secondary | ICD-10-CM | POA: Insufficient documentation

## 2023-12-27 DIAGNOSIS — T148XXA Other injury of unspecified body region, initial encounter: Secondary | ICD-10-CM

## 2023-12-27 DIAGNOSIS — X58XXXA Exposure to other specified factors, initial encounter: Secondary | ICD-10-CM | POA: Insufficient documentation

## 2023-12-27 MED ORDER — LIDOCAINE 5 % EX PTCH
1.0000 | MEDICATED_PATCH | CUTANEOUS | Status: DC
  Administered 2023-12-27: 1 via TRANSDERMAL
  Filled 2023-12-27: qty 1

## 2023-12-27 MED ORDER — NAPROXEN 500 MG PO TABS: 500.0000 mg | ORAL_TABLET | Freq: Two times a day (BID) | ORAL | 0 refills | Status: AC

## 2023-12-27 MED ORDER — KETOROLAC TROMETHAMINE 15 MG/ML IJ SOLN
15.0000 mg | Freq: Once | INTRAMUSCULAR | Status: AC
Start: 2023-12-27 — End: 2023-12-27
  Administered 2023-12-27: 15 mg via INTRAMUSCULAR
  Filled 2023-12-27: qty 1

## 2023-12-27 MED ORDER — LIDOCAINE 5 % EX PTCH
1.0000 | MEDICATED_PATCH | Freq: Two times a day (BID) | CUTANEOUS | 0 refills | Status: AC
Start: 2023-12-27 — End: 2024-01-01

## 2023-12-27 NOTE — ED Triage Notes (Signed)
 Pt presents to the ED with right lower back pain that started Saturday afternoon. Pt reports certain positions that he sit in makes the pain worse. Pt denies injury or trauma.  Pt able to ambulate

## 2023-12-27 NOTE — ED Notes (Signed)
 Discharge instructions reviewed with patient. Patient questions answered and opportunity for education reviewed. Patient voices understanding of discharge instructions with no further questions. Patient ambulatory with steady gait to lobby.

## 2023-12-27 NOTE — ED Provider Notes (Signed)
 [facility] Provider Note    Event Date/Time   First MD Initiated Contact with Patient 12/27/23 1338     (approximate)   History   Back Pain   HPI  Grant [name] is a 48 y.o. male who presents today for evaluation of right low back pain.  Patient reports that he played golf and softball 2 days ago which is when his pain began.  He denies any radiation of his pain.  He reports that his pain is worsened when leaning forward or doing truncal rotation.  No weakness or paresthesias.  No urinary or fecal incontinence or retention.  No saddle anesthesia.  No fevers or chills.  No dysuria or hematuria.  No abdominal pain.  Patient Active Problem List   Diagnosis Date Noted   SOB (shortness of breath) 06/22/2023   Atrial fibrillation with RVR (HCC) 06/19/2023   CAD S/P percutaneous coronary angioplasty 06/19/2023   OSA (obstructive sleep apnea) 06/19/2023   Elevated Lp(a) 02/17/2023   Atherosclerosis of aorta (HCC) 02/17/2023   Nausea and vomiting 02/11/2023   Chest pain 02/10/2023   Dyslipidemia (high LDL; low HDL) 02/10/2023   Obesity, Class III, BMI 40-49.9 (morbid obesity) 02/10/2023   Unstable angina (HCC) 02/10/2023   Abnormal cardiac CT angiography 02/10/2023          Physical Exam   Triage Vital Signs: ED Triage Vitals  Encounter Vitals Group     BP 12/27/23 1244 108/72     Systolic BP Percentile --      Diastolic BP Percentile --      Pulse Rate 12/27/23 1244 66     Resp 12/27/23 1244 18     Temp 12/27/23 1244 98.6 F (37 C)     Temp Source 12/27/23 1244 Oral     SpO2 12/27/23 1244 99 %     Weight 12/27/23 1245 (!) 340 lb (154.2 kg)     Height 12/27/23 1245 6' (1.829 m)     Head Circumference --      Peak Flow --      Pain Score 12/27/23 1245 8     Pain Loc --      Pain Education --      Exclude from Growth Chart --     Most recent vital signs: Vitals:   12/27/23 1244 12/27/23 1404  BP: 108/72 110/69  Pulse: 66 70  Resp: 18  15  Temp: 98.6 F (37 C) 98.4 F (36.9 C)  SpO2: 99% 98%    Physical Exam Vitals and nursing note reviewed.  Constitutional:      General: Awake and alert. No acute distress.    Appearance: Normal appearance. The patient is obese HENT:     Head: Normocephalic and atraumatic.     Mouth: Mucous membranes are moist.  Eyes:     General: PERRL. Normal EOMs        Right eye: No discharge.        Left eye: No discharge.     Conjunctiva/sclera: Conjunctivae normal.  Cardiovascular:     Rate and Rhythm: Normal rate and regular rhythm.     Pulses: Normal pulses.  Pulmonary:     Effort: Pulmonary effort is normal. No respiratory distress.     Breath sounds: Normal breath sounds.  Abdominal:     Abdomen is soft. There is no abdominal tenderness. No rebound or guarding. No distention. Musculoskeletal:        General: No swelling. Normal range of  motion.     Cervical back: Normal range of motion and neck supple.  Back: No midline tenderness.  Tenderness to right lumbar paraspinal muscle area.  Strength and sensation 5/5 to bilateral lower extremities. Normal great toe extension against resistance. Normal sensation throughout feet. Normal patellar reflexes. Negative SLR and opposite SLR bilaterally. Negative FABER test Skin:    General: Skin is warm and dry.     Capillary Refill: Capillary refill takes less than 2 seconds.     Findings: No rash.  Neurological:     Mental Status: The patient is awake and alert.      ED Results / Procedures / Treatments   Labs (all labs ordered are listed, but only abnormal results are displayed) Labs Reviewed - No data to display   EKG     RADIOLOGY     PROCEDURES:  Critical Care performed:   Procedures   MEDICATIONS ORDERED IN ED: Medications  lidocaine  (LIDODERM ) 5 % 1 patch (1 patch Transdermal Patch Applied 12/27/23 1401)  ketorolac  (TORADOL ) 15 MG/ML injection 15 mg (15 mg Intramuscular Given 12/27/23 1401)     IMPRESSION /  MDM / ASSESSMENT AND PLAN / ED COURSE  I reviewed the triage vital signs and the nursing notes.   Differential diagnosis includes, but is not limited to, muscle spasm, radiculopathy, muscle strain.  Patient has 5 out of 5 strength with intact sensation to extensor hallucis dorsiflexion and plantarflexion of bilateral lower extremities with normal patellar reflexes bilaterally. Most likely etiology at this point is muscle strain vs spasm. No red flags to indicate patient is at risk for more auspicious process that would require urgent/emergent spinal imaging or subspecialty evaluation at this time. No major trauma, no midline tenderness, no history or physical exam findings to suggest cauda equina syndrome or spinal cord compression. No focal neurological deficits on exam. No constitutional symptoms or history of immunosuppression or IVDA to suggest potential for epidural abscess. No chronic steroid use or advanced age or history of malignancy to suggest proclivity towards pathological fracture.  No abdominal pain or flank pain to suggest kidney stone, no history of kidney stone.  No fever or dysuria or CVAT to suggest pyelonephritis.  No chest pain, back pain, shortness of breath, neurological deficits, to suggest vascular catastrophe, and pulses are equal in all 4 extremities.  History of playing golf and softball prior to symptom onset, and reproduction of symptoms easily with truncal rotation and forward flexion is most consistent with muscle strain versus spasm.  He was treated symptomatically with Toradol  and Lidoderm  patch.  Discussed care instructions and return precautions with patient. Recommended close outpatient follow-up for re-evaluation. Patient agrees with plan of care. Will treat the patient symptomatically as needed for pain control. Will discharge patient to take these medications and return for any worsening or different pain or development of any neurologic symptoms. Educated patient  regarding expected time course for back pain to improve and recommended very close outpatient follow-up.  Patient is in agreement with plan.  He was given a work note as requested.    Patient's presentation is most consistent with acute illness / injury with system symptoms.    FINAL CLINICAL IMPRESSION(S) / ED DIAGNOSES   Final diagnoses:  Muscle strain     Rx / DC Orders   ED Discharge Orders          Ordered    naproxen  (NAPROSYN ) 500 MG tablet  2 times daily with meals  12/27/23 1355    lidocaine  (LIDODERM ) 5 %  Every 12 hours        12/27/23 1355             Note:  This document was prepared using Dragon voice recognition software and may include unintentional dictation errors.   Sion Reinders E, PA-C 12/27/23 1719    Viviano Ground, MD 01/02/24 1028

## 2023-12-27 NOTE — Discharge Instructions (Signed)
Please return to the emergency department for any new, worsening, or changing symptoms or other concerns including weakness in your legs, urinary or stool incontinence or retention, numbness or tingling in your extremities/buttocks/groin, fevers, or any other concerns or change in symptoms.

## 2024-01-11 IMAGING — DX DG WRIST COMPLETE 3+V*R*
4 series · 4 of 4 positions shown · non-contrast
Comparison: None Available.

CLINICAL DATA: Trauma, pain

EXAM:
RIGHT WRIST - COMPLETE 3+ VIEW

[wrist ap]
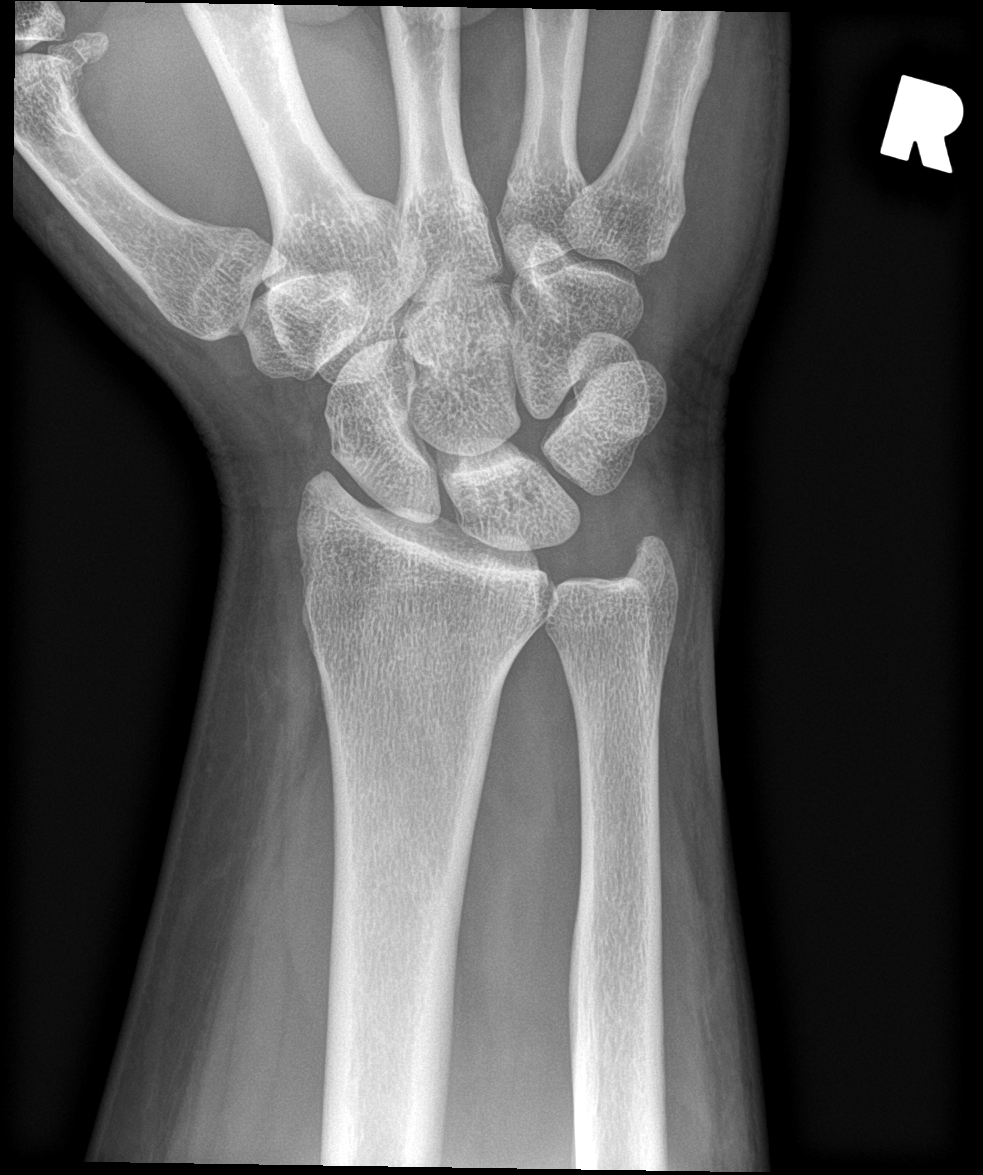

[wrist obl]
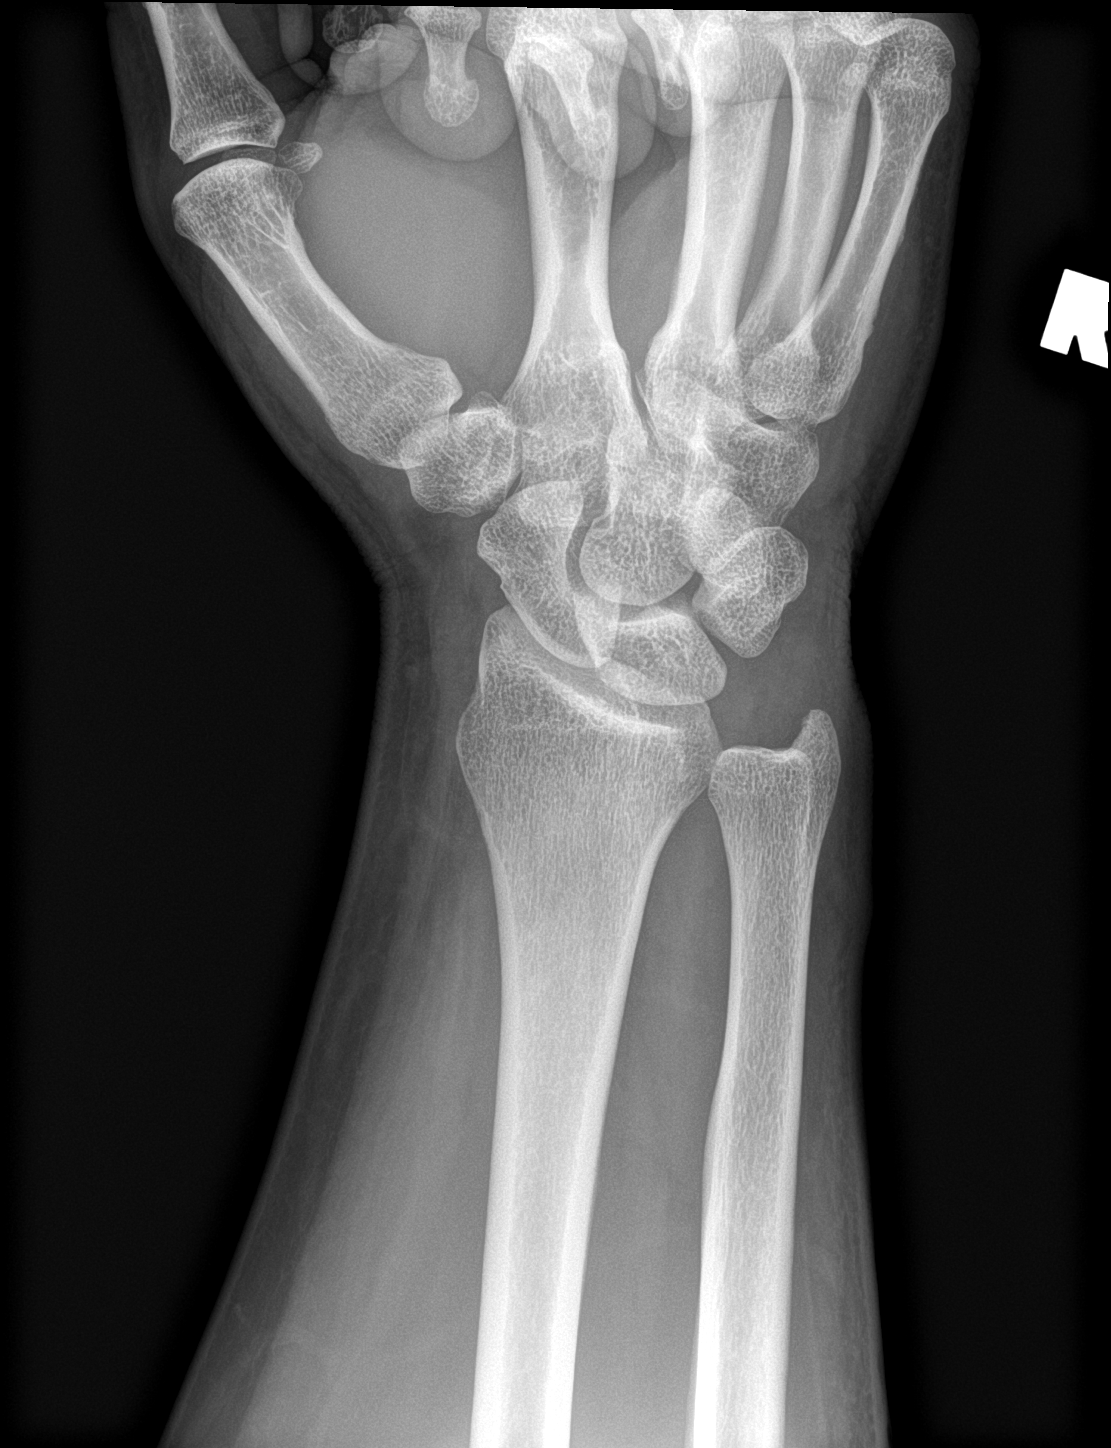

[wrist lat]
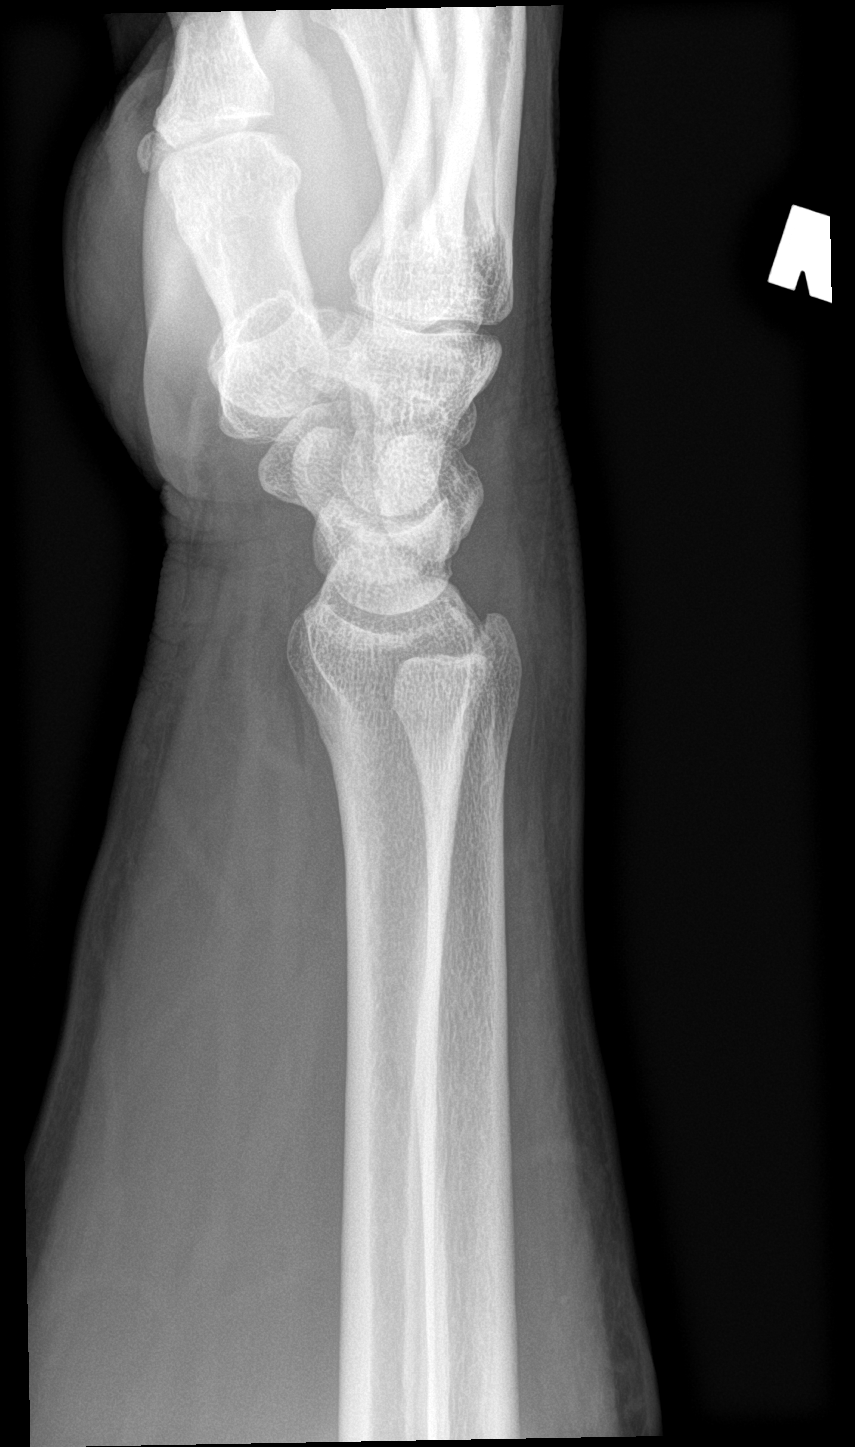

[wrist navicular]
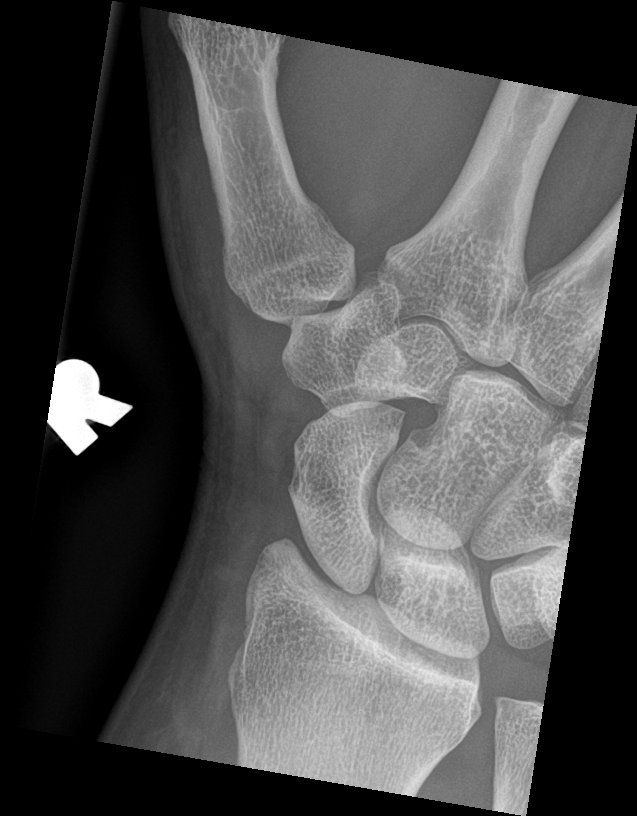

[4 of 4 positions shown; findings below may reference images not displayed]

FINDINGS: There is no evidence of fracture or dislocation. There is no
evidence of arthropathy or other focal bone abnormality. Soft
tissues are unremarkable.
IMPRESSION: No fracture or dislocation is seen in the right wrist.

## 2024-04-09 ENCOUNTER — Emergency Department
Admission: EM | Admit: 2024-04-09 | Discharge: 2024-04-09 | Disposition: A | Discharge status: Home / Self Care | Payer: Self-pay | Attending: Emergency Medicine | Admitting: Emergency Medicine

## 2024-04-09 ENCOUNTER — Other Ambulatory Visit: Payer: Self-pay

## 2024-04-09 DIAGNOSIS — M62830 Muscle spasm of back: Secondary | ICD-10-CM | POA: Diagnosis present

## 2024-04-09 DIAGNOSIS — R112 Nausea with vomiting, unspecified: Secondary | ICD-10-CM | POA: Insufficient documentation

## 2024-04-09 LAB — URINALYSIS, ROUTINE W REFLEX MICROSCOPIC
Bilirubin Urine: NEGATIVE
Glucose, UA: NEGATIVE mg/dL
Hgb urine dipstick: NEGATIVE
Ketones, ur: NEGATIVE mg/dL
Leukocytes,Ua: NEGATIVE
Nitrite: NEGATIVE
Protein, ur: NEGATIVE mg/dL
Specific Gravity, Urine: 1.03 (ref 1.005–1.030)
pH: 7 (ref 5.0–8.0)

## 2024-04-09 MED ORDER — METHOCARBAMOL 500 MG PO TABS
1000.0000 mg | ORAL_TABLET | Freq: Once | ORAL | Status: AC
  Administered 2024-04-09: 1000 mg via ORAL
  Filled 2024-04-09: qty 2

## 2024-04-09 MED ORDER — METHOCARBAMOL 500 MG PO TABS: ORAL_TABLET | ORAL | 0 refills | Status: AC

## 2024-04-09 MED ORDER — HYDROCODONE-ACETAMINOPHEN 7.5-325 MG PO TABS
1.0000 | ORAL_TABLET | Freq: Once | ORAL | Status: AC
  Administered 2024-04-09: 1 via ORAL
  Filled 2024-04-09: qty 1

## 2024-04-09 MED ORDER — HYDROCODONE-ACETAMINOPHEN 5-325 MG PO TABS: 1.0000 | ORAL_TABLET | Freq: Four times a day (QID) | ORAL | 0 refills | Status: AC | PRN

## 2024-04-09 MED ORDER — KETOROLAC TROMETHAMINE 15 MG/ML IJ SOLN
15.0000 mg | Freq: Once | INTRAMUSCULAR | Status: AC
  Administered 2024-04-09: 15 mg via INTRAMUSCULAR
  Filled 2024-04-09: qty 1

## 2024-04-09 MED ORDER — NAPROXEN 500 MG PO TABS: 500.0000 mg | ORAL_TABLET | Freq: Two times a day (BID) | ORAL | 0 refills | Status: AC

## 2024-04-09 NOTE — ED Provider Notes (Signed)
 [name] Provider Note    Event Date/Time   First MD Initiated Contact with Patient 04/09/24 [phone]     (approximate)   History   Back Pain   HPI  Grant [name] is a 48 y.o. male   presents to the ED with complaint of right sided back pain intermittently for the last 4 days.  Patient denies any injury.  He is reports that it felt initially like a spasm but also has had some nausea and vomiting.  Patient has a history of kidney stones.  He denies any urinary symptoms or hematuria.  Patient has history of chest pain, unstable angina, CAD with coronary angioplasty, atrial fibs with RVR and sleep apnea.      Physical Exam   Triage Vital Signs: ED Triage Vitals  Encounter Vitals Group     BP 04/09/24 0839 137/84     Girls Systolic BP Percentile --      Girls Diastolic BP Percentile --      Boys Systolic BP Percentile --      Boys Diastolic BP Percentile --      Pulse Rate 04/09/24 0839 94     Resp --      Temp 04/09/24 0839 98 F (36.7 C)     Temp Source 04/09/24 0839 Oral     SpO2 04/09/24 0839 97 %     Weight 04/09/24 0838 (!) 339 lb 15.2 oz (154.2 kg)     Height --      Head Circumference --      Peak Flow --      Pain Score 04/09/24 0838 7     Pain Loc --      Pain Education --      Exclude from Growth Chart --     Most recent vital signs: Vitals:   04/09/24 0839 04/09/24 0900  BP: 137/84   Pulse: 94   Temp: 98 F (36.7 C)   SpO2: 97% 97%     General: Awake, no distress.  Lying supine on the stretcher.  Cooperative. CV:  Good peripheral perfusion.  Resp:  Normal effort.  Abd:  No distention.  Soft, nontender, bowel sounds normoactive x 4 quadrants.  No CVA tenderness appreciated. Other:     ED Results / Procedures / Treatments   Labs (all labs ordered are listed, but only abnormal results are displayed) Labs Reviewed  URINALYSIS, ROUTINE W REFLEX MICROSCOPIC - Abnormal; Notable for the following components:      Result  Value   Color, Urine YELLOW (*)    APPearance CLEAR (*)    All other components within normal limits      PROCEDURES:  Critical Care performed:   Procedures   MEDICATIONS ORDERED IN ED: Medications  ketorolac  (TORADOL ) 15 MG/ML injection 15 mg (15 mg Intramuscular Given 04/09/24 1118)  HYDROcodone -acetaminophen  (NORCO) 7.5-325 MG per tablet 1 tablet (1 tablet Oral Given 04/09/24 1119)  methocarbamol  (ROBAXIN ) tablet 1,000 mg (1,000 mg Oral Given 04/09/24 1128)     IMPRESSION / MDM / ASSESSMENT AND PLAN / ED COURSE  I reviewed the triage vital signs and the nursing notes.   Differential diagnosis includes, but is not limited to, urolithiasis, acute UTI, pyelonephritis, musculoskeletal strain  48 year old male presents to the ED with complaint of right sided back pain without known history of injury.  Patient does have a history of urolithiasis however his urine was completely clear.  Patient was given hydrocodone , Toradol  15 mg IM  and methocarbamol  1000 mg p.o. while in the emergency department.  We discussed the unlikelihood that he has a kidney stone at this time.  A prescription for the same medications was sent to the pharmacy with the exception of Toradol  which was changed to naproxen .  Patient is to follow-up with his PCP if any continued problems or return to the emergency department if any severe worsening of his symptoms.  We also discussed ice or heat to his back as needed for discomfort.      Patient's presentation is most consistent with acute complicated illness / injury requiring diagnostic workup.  FINAL CLINICAL IMPRESSION(S) / ED DIAGNOSES   Final diagnoses:  Muscle spasm of back     Rx / DC Orders   ED Discharge Orders          Ordered    HYDROcodone -acetaminophen  (NORCO/VICODIN) 5-325 MG tablet  Every 6 hours PRN        04/09/24 1133    methocarbamol  (ROBAXIN ) 500 MG tablet        04/09/24 1133    naproxen  (NAPROSYN ) 500 MG tablet  2 times daily with  meals        04/09/24 1133             Note:  This document was prepared using Dragon voice recognition software and may include unintentional dictation errors.   Saunders Shona CROME, PA-C 04/09/24 1209    Arlander Charleston, MD 04/09/24 [phone]

## 2024-04-09 NOTE — ED Triage Notes (Signed)
 Right lower back pain since Thursday.  Denies injury. Initially felt spasm to are causing patient to vomit. Felt a popping sensation.  Has been using heat over the weekend to help with pain.  Sunday night, while emptying the dishwasher, bent down, slipped on water causing spasms to return.

## 2024-04-09 NOTE — ED Notes (Signed)
Pt encouraged to give a urine sample.  

## 2024-04-09 NOTE — Discharge Instructions (Addendum)
 Follow-up with your primary care provider if any continued problems or concerns.  A prescription for hydrocodone , methocarbamol  and naproxen  was sent to the pharmacy for you begin taking as needed for musculoskeletal pain due to your muscle spasms.  You may use ice or heat to your muscles as needed for discomfort.  Also the lidocaine  patches over-the-counter can be applied to this area.  If any worsening of your symptoms return to the emergency department.  The urine test that was looked at today in the emergency department was negative for blood and not suspicious for kidney stone.

## 2024-04-19 ENCOUNTER — Encounter: Payer: Self-pay | Admitting: Emergency Medicine

## 2024-04-19 ENCOUNTER — Other Ambulatory Visit: Payer: Self-pay

## 2024-04-19 ENCOUNTER — Emergency Department

## 2024-04-19 ENCOUNTER — Emergency Department
Admission: EM | Admit: 2024-04-19 | Discharge: 2024-04-19 | Disposition: A | Discharge status: Home / Self Care | Attending: Emergency Medicine | Admitting: Emergency Medicine

## 2024-04-19 DIAGNOSIS — S20211A Contusion of right front wall of thorax, initial encounter: Secondary | ICD-10-CM | POA: Insufficient documentation

## 2024-04-19 DIAGNOSIS — Y92002 Bathroom of unspecified non-institutional (private) residence single-family (private) house as the place of occurrence of the external cause: Secondary | ICD-10-CM | POA: Diagnosis not present

## 2024-04-19 DIAGNOSIS — S29001A Unspecified injury of muscle and tendon of front wall of thorax, initial encounter: Secondary | ICD-10-CM | POA: Diagnosis not present

## 2024-04-19 DIAGNOSIS — M545 Low back pain, unspecified: Secondary | ICD-10-CM | POA: Diagnosis not present

## 2024-04-19 DIAGNOSIS — Z043 Encounter for examination and observation following other accident: Secondary | ICD-10-CM | POA: Diagnosis not present

## 2024-04-19 DIAGNOSIS — I2510 Atherosclerotic heart disease of native coronary artery without angina pectoris: Secondary | ICD-10-CM | POA: Insufficient documentation

## 2024-04-19 DIAGNOSIS — W01198A Fall on same level from slipping, tripping and stumbling with subsequent striking against other object, initial encounter: Secondary | ICD-10-CM | POA: Insufficient documentation

## 2024-04-19 LAB — CBC WITH DIFFERENTIAL/PLATELET
Abs Immature Granulocytes: 0.03 K/uL (ref 0.00–0.07)
Basophils Absolute: 0 K/uL (ref 0.0–0.1)
Basophils Relative: 0 %
Eosinophils Absolute: 0.1 K/uL (ref 0.0–0.5)
Eosinophils Relative: 2 %
HCT: 43.2 % (ref 39.0–52.0)
Hemoglobin: 14.1 g/dL (ref 13.0–17.0)
Immature Granulocytes: 0 %
Lymphocytes Relative: 22 %
Lymphs Abs: 1.6 K/uL (ref 0.7–4.0)
MCH: 27.8 pg (ref 26.0–34.0)
MCHC: 32.6 g/dL (ref 30.0–36.0)
MCV: 85.2 fL (ref 80.0–100.0)
Monocytes Absolute: 0.8 K/uL (ref 0.1–1.0)
Monocytes Relative: 11 %
Neutro Abs: 4.6 K/uL (ref 1.7–7.7)
Neutrophils Relative %: 65 %
Platelets: 177 K/uL (ref 150–400)
RBC: 5.07 MIL/uL (ref 4.22–5.81)
RDW: 12.6 % (ref 11.5–15.5)
WBC: 7.1 K/uL (ref 4.0–10.5)
nRBC: 0 % (ref 0.0–0.2)

## 2024-04-19 LAB — BASIC METABOLIC PANEL WITH GFR
Anion gap: 10 (ref 5–15)
BUN: 11 mg/dL (ref 6–20)
CO2: 24 mmol/L (ref 22–32)
Calcium: 8.6 mg/dL — ABNORMAL LOW (ref 8.9–10.3)
Chloride: 105 mmol/L (ref 98–111)
Creatinine, Ser: 0.88 mg/dL (ref 0.61–1.24)
GFR, Estimated: >60 mL/min (ref >60)
Glucose, Bld: 113 mg/dL — ABNORMAL HIGH (ref 70–99)
Potassium: 4.3 mmol/L (ref 3.5–5.1)
Sodium: 139 mmol/L (ref 135–145)

## 2024-04-19 LAB — URINALYSIS, ROUTINE W REFLEX MICROSCOPIC
Bilirubin Urine: NEGATIVE
Glucose, UA: NEGATIVE mg/dL
Hgb urine dipstick: NEGATIVE
Ketones, ur: NEGATIVE mg/dL
Leukocytes,Ua: NEGATIVE
Nitrite: NEGATIVE
Protein, ur: NEGATIVE mg/dL
Specific Gravity, Urine: 1.023 (ref 1.005–1.030)
pH: 7 (ref 5.0–8.0)

## 2024-04-19 MED ORDER — LIDOCAINE 5 % EX PTCH: 1.0000 | MEDICATED_PATCH | Freq: Two times a day (BID) | CUTANEOUS | 0 refills | Status: AC

## 2024-04-19 MED ORDER — NAPROXEN 500 MG PO TABS: 500.0000 mg | ORAL_TABLET | Freq: Two times a day (BID) | ORAL | 0 refills | Status: AC

## 2024-04-19 MED ORDER — ACETAMINOPHEN 325 MG PO TABS
650.0000 mg | ORAL_TABLET | Freq: Once | ORAL | Status: AC
  Administered 2024-04-19: 650 mg via ORAL
  Filled 2024-04-19: qty 2

## 2024-04-19 MED ORDER — KETOROLAC TROMETHAMINE 15 MG/ML IJ SOLN
15.0000 mg | Freq: Once | INTRAMUSCULAR | Status: AC
  Administered 2024-04-19: 15 mg via INTRAMUSCULAR
  Filled 2024-04-19: qty 1

## 2024-04-19 MED ORDER — LIDOCAINE 5 % EX PTCH
1.0000 | MEDICATED_PATCH | CUTANEOUS | Status: DC
  Administered 2024-04-19: 1 via TRANSDERMAL
  Filled 2024-04-19: qty 1

## 2024-04-19 NOTE — Discharge Instructions (Signed)
 Your blood work, urine, and x-ray are normal.  You may take the medications as prescribed to help with your symptoms, do not take the naproxen  with any other NSAIDs. Please return to the emergency department for any new, worsening, or changing symptoms or other concerns including weakness in your legs, urinary or stool incontinence or retention, numbness or tingling in your extremities/buttocks/groin, fevers, or any other concerns or change in symptoms.

## 2024-04-19 NOTE — ED Notes (Signed)
 See triage note  Presents with back pain  states pain started after a fall  Pain is mainly to right side of back States he had a trip and fall   Ambulates well to treatment room

## 2024-04-19 NOTE — ED Provider Notes (Signed)
 [facility] Provider Note    Event Date/Time   First MD Initiated Contact with Patient 04/19/24 0848     (approximate)   History   Back Pain   HPI  Grant [name] is a 48 y.o. male who presents today for evaluation of right sided back pain.  Patient reports that he has had this back pain multiple times in the past.  He reports that he went bowling last night and thinks that he tweaked his back again.  He reports that he had a trip in the bathroom this morning, and feels that he pulled his back more again.  He also notes that he hit the side of his ribs on the counter in the bathroom.  He denies any radiation of pain into his belly or down his legs.  He is not had any urinary or fecal incontinence or retention.  No fevers or chills.  He is requesting pain medication.  He is requesting a work note.  Patient Active Problem List   Diagnosis Date Noted   SOB (shortness of breath) 06/22/2023   Atrial fibrillation with RVR (HCC) 06/19/2023   CAD S/P percutaneous coronary angioplasty 06/19/2023   OSA (obstructive sleep apnea) 06/19/2023   Elevated Lp(a) 02/17/2023   Atherosclerosis of aorta 02/17/2023   Nausea and vomiting 02/11/2023   Chest pain 02/10/2023   Dyslipidemia (high LDL; low HDL) 02/10/2023   Obesity, Class III, BMI 40-49.9 (morbid obesity) 02/10/2023   Unstable angina (HCC) 02/10/2023   Abnormal cardiac CT angiography 02/10/2023          Physical Exam   Triage Vital Signs: ED Triage Vitals  Encounter Vitals Group     BP 04/19/24 0838 138/79     Girls Systolic BP Percentile --      Girls Diastolic BP Percentile --      Boys Systolic BP Percentile --      Boys Diastolic BP Percentile --      Pulse Rate 04/19/24 0838 90     Resp 04/19/24 0838 17     Temp 04/19/24 0838 98.2 F (36.8 C)     Temp Source 04/19/24 0838 Oral     SpO2 04/19/24 0838 100 %     Weight 04/19/24 0837 (!) 345 lb (156.5 kg)     Height 04/19/24 0837 6' 1 (1.854 m)      Head Circumference --      Peak Flow --      Pain Score 04/19/24 0837 9     Pain Loc --      Pain Education --      Exclude from Growth Chart --     Most recent vital signs: Vitals:   04/19/24 0838  BP: 138/79  Pulse: 90  Resp: 17  Temp: 98.2 F (36.8 C)  SpO2: 100%    Physical Exam Vitals and nursing note reviewed.  Constitutional:      General: Awake and alert. No acute distress.    Appearance: Normal appearance.  HENT:     Head: Normocephalic and atraumatic.     Mouth: Mucous membranes are moist.  Eyes:     General: PERRL. Normal EOMs        Right eye: No discharge.        Left eye: No discharge.     Conjunctiva/sclera: Conjunctivae normal.  Cardiovascular:     Rate and Rhythm: Normal rate and regular rhythm.     Pulses: Normal pulses.  Heart sounds: Normal heart sounds Pulmonary:     Effort: Pulmonary effort is normal. No respiratory distress.     Breath sounds: Normal breath sounds.  Abdominal:     Abdomen is soft. There is no abdominal tenderness. No rebound or guarding. No distention. Musculoskeletal:        General: No swelling. Normal range of motion.     Cervical back: Normal range of motion and neck supple.  Back: No midline tenderness.  Mild tenderness to his right lumbar paraspinal muscle area.  Strength and sensation 5/5 to bilateral lower extremities. Normal great toe extension against resistance. Normal sensation throughout feet. Normal patellar reflexes. Negative SLR and opposite SLR bilaterally. Negative FABER test Skin:    General: Skin is warm and dry.     Capillary Refill: Capillary refill takes less than 2 seconds.     Findings: No rash.  Neurological:     Mental Status: The patient is awake and alert.      ED Results / Procedures / Treatments   Labs (all labs ordered are listed, but only abnormal results are displayed) Labs Reviewed  BASIC METABOLIC PANEL WITH GFR - Abnormal; Notable for the following components:      Result  Value   Glucose, Bld 113 (*)    Calcium  8.6 (*)    All other components within normal limits  URINALYSIS, ROUTINE W REFLEX MICROSCOPIC - Abnormal; Notable for the following components:   Color, Urine YELLOW (*)    APPearance HAZY (*)    All other components within normal limits  CBC WITH DIFFERENTIAL/PLATELET     EKG     RADIOLOGY I independently reviewed and interpreted imaging and agree with radiologists findings.     PROCEDURES:  Critical Care performed:   Procedures   MEDICATIONS ORDERED IN ED: Medications  lidocaine  (LIDODERM ) 5 % 1 patch (1 patch Transdermal Patch Applied 04/19/24 0938)  ketorolac  (TORADOL ) 15 MG/ML injection 15 mg (15 mg Intramuscular Given 04/19/24 0937)  acetaminophen  (TYLENOL ) tablet 650 mg (650 mg Oral Given 04/19/24 0937)     IMPRESSION / MDM / ASSESSMENT AND PLAN / ED COURSE  I reviewed the triage vital signs and the nursing notes.   Differential diagnosis includes, but is not limited to, muscle strain, muscle spasm, contusion.  I reviewed the patient's chart.  Patient has been seen in the emergency department for back pain multiple times, including 10/07/2023, 12/27/2023, 04/09/2024, as well as outpatient on 10/20/2023.  Patient is awake and alert, hemodynamically stable and afebrile.  He has normal strength and sensation in his bilateral lower extremities.  No bowel or bladder changes to suggest cauda equina or cord compression, and he is ambulatory with a steady gait.  No radicular symptoms.  No abdominal pain or tenderness on exam to suggest intra-abdominal abnormality.  He is equal pulses in all 4 extremities.  There is no vertebral tenderness on exam to suggest vertebral injury.  Urinalysis does not reveal blood, he does not have any colicky pain, I do not suspect kidney stone.  Given that he also struck his ribs when he slipped in the bathroom, x-ray obtained, this is negative for any acute findings.  Labs are reassuring.  He was treated  symptomatically with Toradol , Tylenol , and Lidoderm  patch with resolution of his pain.  He is requesting a work note.  We discussed return precautions and outpatient follow-up.  Patient understands and agrees with plan.  He was discharged in stable condition.  Patient's presentation is most consistent  with acute complicated illness / injury requiring diagnostic workup.      FINAL CLINICAL IMPRESSION(S) / ED DIAGNOSES   Final diagnoses:  Acute right-sided low back pain without sciatica  Contusion of rib on right side, initial encounter     Rx / DC Orders   ED Discharge Orders          Ordered    naproxen  (NAPROSYN ) 500 MG tablet  2 times daily with meals        04/19/24 1042    lidocaine  (LIDODERM ) 5 %  Every 12 hours        04/19/24 1042             Note:  This document was prepared using Dragon voice recognition software and may include unintentional dictation errors.   Khaleel Beckom E, PA-C 04/19/24 1046    Dicky Anes, MD 04/19/24 [phone]

## 2024-04-19 NOTE — ED Triage Notes (Signed)
 Patient to ED via POV for right sided back pain. Started this AM after a trip and fall. States recently had similar pain. NAD noted. Denies hitting head or LOC.

## 2024-04-30 ENCOUNTER — Emergency Department (HOSPITAL_BASED_OUTPATIENT_CLINIC_OR_DEPARTMENT_OTHER)

## 2024-04-30 ENCOUNTER — Emergency Department (HOSPITAL_BASED_OUTPATIENT_CLINIC_OR_DEPARTMENT_OTHER)
Admission: EM | Admit: 2024-04-30 | Discharge: 2024-04-30 | Disposition: A | Discharge status: Home / Self Care | Attending: Emergency Medicine | Admitting: Emergency Medicine

## 2024-04-30 ENCOUNTER — Encounter (HOSPITAL_BASED_OUTPATIENT_CLINIC_OR_DEPARTMENT_OTHER): Payer: Self-pay | Admitting: Emergency Medicine

## 2024-04-30 ENCOUNTER — Other Ambulatory Visit: Payer: Self-pay

## 2024-04-30 DIAGNOSIS — I2510 Atherosclerotic heart disease of native coronary artery without angina pectoris: Secondary | ICD-10-CM | POA: Diagnosis not present

## 2024-04-30 DIAGNOSIS — I4891 Unspecified atrial fibrillation: Secondary | ICD-10-CM | POA: Diagnosis not present

## 2024-04-30 DIAGNOSIS — Z7901 Long term (current) use of anticoagulants: Secondary | ICD-10-CM | POA: Insufficient documentation

## 2024-04-30 DIAGNOSIS — Z87442 Personal history of urinary calculi: Secondary | ICD-10-CM | POA: Diagnosis not present

## 2024-04-30 DIAGNOSIS — Z955 Presence of coronary angioplasty implant and graft: Secondary | ICD-10-CM | POA: Diagnosis not present

## 2024-04-30 DIAGNOSIS — R0789 Other chest pain: Secondary | ICD-10-CM | POA: Insufficient documentation

## 2024-04-30 DIAGNOSIS — I482 Chronic atrial fibrillation, unspecified: Secondary | ICD-10-CM | POA: Diagnosis not present

## 2024-04-30 DIAGNOSIS — R112 Nausea with vomiting, unspecified: Secondary | ICD-10-CM | POA: Insufficient documentation

## 2024-04-30 DIAGNOSIS — R079 Chest pain, unspecified: Secondary | ICD-10-CM | POA: Diagnosis not present

## 2024-04-30 LAB — COMPREHENSIVE METABOLIC PANEL WITH GFR
ALT: 24 U/L (ref 0–44)
AST: 21 U/L (ref 15–41)
Albumin: 4 g/dL (ref 3.5–5.0)
Alkaline Phosphatase: 69 U/L (ref 38–126)
Anion gap: 8 (ref 5–15)
BUN: 10 mg/dL (ref 6–20)
CO2: 26 mmol/L (ref 22–32)
Calcium: 9.8 mg/dL (ref 8.9–10.3)
Chloride: 105 mmol/L (ref 98–111)
Creatinine, Ser: 0.81 mg/dL (ref 0.61–1.24)
GFR, Estimated: >60 mL/min (ref >60)
Glucose, Bld: 124 mg/dL — ABNORMAL HIGH (ref 70–99)
Potassium: 4.4 mmol/L (ref 3.5–5.1)
Sodium: 139 mmol/L (ref 135–145)
Total Bilirubin: 0.3 mg/dL (ref 0.0–1.2)
Total Protein: 6.7 g/dL (ref 6.5–8.1)

## 2024-04-30 LAB — CBC WITH DIFFERENTIAL/PLATELET
Abs Immature Granulocytes: 0.04 K/uL (ref 0.00–0.07)
Basophils Absolute: 0 K/uL (ref 0.0–0.1)
Basophils Relative: 0 %
Eosinophils Absolute: 0.2 K/uL (ref 0.0–0.5)
Eosinophils Relative: 2 %
HCT: 45.5 % (ref 39.0–52.0)
Hemoglobin: 15.1 g/dL (ref 13.0–17.0)
Immature Granulocytes: 1 %
Lymphocytes Relative: 21 %
Lymphs Abs: 1.7 K/uL (ref 0.7–4.0)
MCH: 28.1 pg (ref 26.0–34.0)
MCHC: 33.2 g/dL (ref 30.0–36.0)
MCV: 84.6 fL (ref 80.0–100.0)
Monocytes Absolute: 0.7 K/uL (ref 0.1–1.0)
Monocytes Relative: 9 %
Neutro Abs: 5.4 K/uL (ref 1.7–7.7)
Neutrophils Relative %: 67 %
Platelets: 196 K/uL (ref 150–400)
RBC: 5.38 MIL/uL (ref 4.22–5.81)
RDW: 13.2 % (ref 11.5–15.5)
WBC: 8 K/uL (ref 4.0–10.5)
nRBC: 0 % (ref 0.0–0.2)

## 2024-04-30 LAB — TROPONIN T, HIGH SENSITIVITY
Troponin T High Sensitivity: <15 ng/L (ref 0–19)
Troponin T High Sensitivity: <15 ng/L (ref 0–19)

## 2024-04-30 LAB — LIPASE, BLOOD: Lipase: 37 U/L (ref 11–51)

## 2024-04-30 MED ORDER — SODIUM CHLORIDE 0.9 % IV BOLUS
1000.0000 mL | Freq: Once | INTRAVENOUS | Status: AC
  Administered 2024-04-30: 1000 mL via INTRAVENOUS

## 2024-04-30 MED ORDER — ONDANSETRON HCL 4 MG/2ML IJ SOLN
4.0000 mg | Freq: Once | INTRAMUSCULAR | Status: AC
  Administered 2024-04-30: 4 mg via INTRAVENOUS
  Filled 2024-04-30: qty 2

## 2024-04-30 MED ORDER — ALUM & MAG HYDROXIDE-SIMETH 200-200-20 MG/5ML PO SUSP
30.0000 mL | Freq: Once | ORAL | Status: AC
  Administered 2024-04-30: 30 mL via ORAL
  Filled 2024-04-30: qty 30

## 2024-04-30 MED ORDER — ONDANSETRON 4 MG PO TBDP: 4.0000 mg | ORAL_TABLET | Freq: Three times a day (TID) | ORAL | 0 refills | Status: AC | PRN

## 2024-04-30 MED ORDER — LIDOCAINE VISCOUS HCL 2 % MT SOLN
15.0000 mL | Freq: Once | OROMUCOSAL | Status: AC
  Administered 2024-04-30: 15 mL via ORAL
  Filled 2024-04-30: qty 15

## 2024-04-30 NOTE — ED Notes (Signed)
 Discharge instructions, follow up care, and prescription reviewed and explained, pt verbalized understanding and had no further questions on d/c. Pt caox4, ambulatory NAD on d/c.

## 2024-04-30 NOTE — ED Triage Notes (Signed)
 Pt caox4, ambulatory stating he woke up at approx 0500 with CP and shortly after being up he began having N/V. PMH afib and cardiac stent.

## 2024-04-30 NOTE — ED Provider Notes (Signed)
 [place] EMERGENCY DEPARTMENT AT [facility] Provider Note   CSN: [id number] Arrival date & time: 04/30/24  9261     Patient presents with: Chest Pain   Grant [name] is a 48 y.o. male.   Patient here with chest pain nausea vomiting.  History of A-fib cardiac stents CAD.  Woke up around 5 AM feeling nauseous some chest discomfort was able to go back to bed woke up later on and threw up several times.  Some chest discomforts remain but he is feeling little bit better.  Denies any suspicious food or sick contacts.  Maybe little bit decreased energy here recently.  Had a heart stent placed last year.  Had a lot of fatigue shortness of breath nausea then.  He denies any fever or chills.  No abdominal pain.  No diarrhea.  Has a history of kidney stones as well.  Has had multiple surgeries including appendectomy and tonsillectomy.  The history is provided by the patient.       Prior to Admission medications   Medication Sig Start Date End Date Taking? Authorizing Provider  ondansetron  (ZOFRAN -ODT) 4 MG disintegrating tablet Take 1 tablet (4 mg total) by mouth every 8 (eight) hours as needed. 04/30/24  Yes Taras Rask, DO  apixaban  (ELIQUIS ) 5 MG TABS tablet Take 1 tablet (5 mg total) by mouth 2 (two) times daily. 07/05/23 07/04/24  Daneen Damien BROCKS, NP  clopidogrel  (PLAVIX ) 75 MG tablet Take 1 tablet (75 mg total) by mouth daily. 06/24/23 06/23/24  Von Bellis, MD  dicyclomine  (BENTYL ) 20 MG tablet Take 1 tablet (20 mg total) by mouth 2 (two) times daily as needed. 09/20/23   Silver Wonda LABOR, PA  ezetimibe  (ZETIA ) 10 MG tablet Take 1 tablet (10 mg total) by mouth daily. 06/24/23 06/23/24  Von Bellis, MD  HYDROcodone -acetaminophen  (NORCO/VICODIN) 5-325 MG tablet Take 1 tablet by mouth every 6 (six) hours as needed. 04/09/24 04/09/25  Saunders Shona CROME, PA-C  lidocaine  (LIDODERM ) 5 % Place 1 patch onto the skin daily. Remove & Discard patch within 12 hours or as directed by MD  10/07/23   Nettie, April, MD  methocarbamol  (ROBAXIN ) 500 MG tablet 1-2 tablets q 6 hours prn muscle spasms 04/09/24   Saunders Shona CROME, PA-C  metoprolol  succinate (TOPROL -XL) 25 MG 24 hr tablet Take 1 tablet (25 mg total) by mouth daily. 09/12/23   Amin, Sumayya, MD  naproxen  (NAPROSYN ) 500 MG tablet Take 1 tablet (500 mg total) by mouth 2 (two) times daily with a meal. 04/09/24   Saunders Shona CROME, PA-C  pantoprazole  (PROTONIX ) 40 MG tablet Take 1 tablet (40 mg total) by mouth daily. 06/24/23 06/23/24  Von Bellis, MD  rosuvastatin  (CRESTOR ) 40 MG tablet Take 1 tablet (40 mg total) by mouth daily. 02/12/23   Caleen Qualia, MD    Allergies: Patient has no known allergies.    Review of Systems  Updated Vital Signs BP 119/84 (BP Location: Left Arm)   Pulse 77   Temp 97.9 F (36.6 C) (Oral)   Resp 18   Ht 6' 1 (1.854 m)   Wt (!) 156.5 kg   SpO2 100%   BMI 45.52 kg/m   Physical Exam Vitals and nursing note reviewed.  Constitutional:      General: He is not in acute distress.    Appearance: He is well-developed. He is not ill-appearing.  HENT:     Head: Normocephalic and atraumatic.  Eyes:     Extraocular Movements: Extraocular movements intact.  Conjunctiva/sclera: Conjunctivae normal.     Pupils: Pupils are equal, round, and reactive to light.  Cardiovascular:     Rate and Rhythm: Normal rate and regular rhythm.     Pulses:          Radial pulses are 2+ on the right side and 2+ on the left side.     Heart sounds: Normal heart sounds. No murmur heard. Pulmonary:     Effort: Pulmonary effort is normal. No respiratory distress.     Breath sounds: Normal breath sounds. No decreased breath sounds, wheezing or rhonchi.  Abdominal:     Palpations: Abdomen is soft.     Tenderness: There is no abdominal tenderness.  Musculoskeletal:        General: No swelling.     Cervical back: Neck supple.  Skin:    General: Skin is warm and dry.     Capillary Refill: Capillary refill  takes less than 2 seconds.  Neurological:     General: No focal deficit present.     Mental Status: He is alert.  Psychiatric:        Mood and Affect: Mood normal.     (all labs ordered are listed, but only abnormal results are displayed) Labs Reviewed  COMPREHENSIVE METABOLIC PANEL WITH GFR - Abnormal; Notable for the following components:      Result Value   Glucose, Bld 124 (*)    All other components within normal limits  CBC WITH DIFFERENTIAL/PLATELET  LIPASE, BLOOD  TROPONIN T, HIGH SENSITIVITY  TROPONIN T, HIGH SENSITIVITY    EKG: EKG Interpretation Date/Time:  Monday April 30 2024 07:47:12 EDT Ventricular Rate:  87 PR Interval:  136 QRS Duration:  117 QT Interval:  367 QTC Calculation: 442 R Axis:   89  Text Interpretation: Sinus rhythm Confirmed by Ruthe Cornet [phone]) on 04/30/2024 7:49:40 AM  Radiology: [facility] Chest Portable 1 View Result Date: 04/30/2024 EXAM: 1 VIEW XRAY OF THE CHEST 04/30/2024 08:17:00 AM COMPARISON: None available. CLINICAL HISTORY: CP. He woke up at approx 0500 with CP and shortly after being up he began having N/V. PMH afib and cardiac stent done last year. States the pain feels similar to the pain he had before his stent. FINDINGS: LUNGS AND PLEURA: No focal pulmonary opacity. No pulmonary edema. No pleural effusion. No pneumothorax. HEART AND MEDIASTINUM: No acute abnormality of the cardiac and mediastinal silhouettes. BONES AND SOFT TISSUES: No acute osseous abnormality. IMPRESSION: 1. No acute process. Electronically signed by: Norleen Boxer MD 04/30/2024 08:40 AM EDT RP Workstation: [id number]     Procedures   Medications Ordered in the ED  ondansetron  (ZOFRAN ) injection 4 mg (4 mg Intravenous Given 04/30/24 0815)  sodium chloride  0.9 % bolus 1,000 mL (0 mLs Intravenous Stopped 04/30/24 0906)  alum & mag hydroxide-simeth (MAALOX/MYLANTA) 200-200-20 MG/5ML suspension 30 mL (30 mLs Oral Given 04/30/24 0945)    And  lidocaine  (XYLOCAINE ) 2  % viscous mouth solution 15 mL (15 mLs Oral Given 04/30/24 0945)                                    Medical Decision Making Amount and/or Complexity of Data Reviewed Labs: ordered. Radiology: ordered.  Risk OTC drugs. Prescription drug management.   Lynwood KATHEE Lobstein is here with chest pain nausea vomiting.  Normal vitals.  No fever.  Sounds like chest pain started slightly before the nausea vomiting episodes.  Feeling  better now but still some discomfort some nausea.  History of CAD A-fib kidney stones.  Had a stent placed in his heart last year.  He remains on Plavix .  EKG shows sinus rhythm.  No ischemic changes per my review and interpretation of EKG.  Differential diagnosis could be ACS but could be some sort of GI issues/viral process/food poisoning process.  However no diarrhea.  Maybe some fatigue you have the last week.  Will evaluate for ACS electrolyte abnormality abdominal labs chest x-ray.  Will give fluid bolus Zofran  and reevaluate.  Per my review and interpretation of labs troponin is negative x 2.  EKG reassuring.  Nausea and vomiting greatly improved with GI cocktail and Zofran .  No significant leukocytosis anemia or electrolyte abnormality otherwise.  Chest x-ray no evidence of pneumonia pneumothorax.  Is not having any abdominal pain on repeat exam.  Pain is resolved.  Overall I do think that this is likely some GI related process and unlikely to be ACS PE or other acute process.  Feeling much better.  Vital signs are unremarkable.  Will have him follow-up with cardiology and primary care.  Told to return if symptoms worsen.  Discharge.  This chart was dictated using voice recognition software.  Despite best efforts to proofread,  errors can occur which can change the documentation meaning.        Final diagnoses:  Atypical chest pain  Nausea and vomiting, unspecified vomiting type    ED Discharge Orders          Ordered    ondansetron  (ZOFRAN -ODT) 4 MG  disintegrating tablet  Every 8 hours PRN        04/30/24 1052               Fidel Caggiano, DO 04/30/24 1054

## 2024-04-30 NOTE — Discharge Instructions (Addendum)
 Continue Zofran  for nausea and vomiting.  I do suspect likely viral process or foodborne illness.  Please return if you develop worsening exertional chest pain or any other concerning symptoms.  Follow-up with your primary care doctor and your cardiology team.

## 2024-08-06 ENCOUNTER — Emergency Department (HOSPITAL_COMMUNITY): Admission: EM | Admit: 2024-08-06 | Discharge: 2024-08-07 | Disposition: A

## 2024-08-06 ENCOUNTER — Emergency Department (HOSPITAL_COMMUNITY)

## 2024-08-06 ENCOUNTER — Encounter (HOSPITAL_COMMUNITY): Payer: Self-pay

## 2024-08-06 ENCOUNTER — Other Ambulatory Visit: Payer: Self-pay

## 2024-08-06 DIAGNOSIS — M25462 Effusion, left knee: Secondary | ICD-10-CM | POA: Diagnosis not present

## 2024-08-06 DIAGNOSIS — M25562 Pain in left knee: Secondary | ICD-10-CM

## 2024-08-06 DIAGNOSIS — Z7901 Long term (current) use of anticoagulants: Secondary | ICD-10-CM | POA: Diagnosis not present

## 2024-08-06 LAB — CBC WITH DIFFERENTIAL/PLATELET
Abs Immature Granulocytes: 0.04 K/uL (ref 0.00–0.07)
Basophils Absolute: 0 K/uL (ref 0.0–0.1)
Basophils Relative: 0 %
Eosinophils Absolute: 0.1 K/uL (ref 0.0–0.5)
Eosinophils Relative: 1 %
HCT: 48.1 % (ref 39.0–52.0)
Hemoglobin: 15.9 g/dL (ref 13.0–17.0)
Immature Granulocytes: 0 %
Lymphocytes Relative: 18 %
Lymphs Abs: 1.8 K/uL (ref 0.7–4.0)
MCH: 28 pg (ref 26.0–34.0)
MCHC: 33.1 g/dL (ref 30.0–36.0)
MCV: 84.7 fL (ref 80.0–100.0)
Monocytes Absolute: 1 K/uL (ref 0.1–1.0)
Monocytes Relative: 10 %
Neutro Abs: 6.9 K/uL (ref 1.7–7.7)
Neutrophils Relative %: 71 %
Platelets: 223 K/uL (ref 150–400)
RBC: 5.68 MIL/uL (ref 4.22–5.81)
RDW: 12.6 % (ref 11.5–15.5)
WBC: 9.9 K/uL (ref 4.0–10.5)
nRBC: 0 % (ref 0.0–0.2)

## 2024-08-06 LAB — BASIC METABOLIC PANEL WITH GFR
Anion gap: 8 (ref 5–15)
BUN: 11 mg/dL (ref 6–20)
CO2: 25 mmol/L (ref 22–32)
Calcium: 9.4 mg/dL (ref 8.9–10.3)
Chloride: 105 mmol/L (ref 98–111)
Creatinine, Ser: 0.92 mg/dL (ref 0.61–1.24)
GFR, Estimated: >60 mL/min
Glucose, Bld: 102 mg/dL — ABNORMAL HIGH (ref 70–99)
Potassium: 4.2 mmol/L (ref 3.5–5.1)
Sodium: 138 mmol/L (ref 135–145)

## 2024-08-06 MED ORDER — HYDROCODONE-ACETAMINOPHEN 5-325 MG PO TABS
1.0000 | ORAL_TABLET | Freq: Once | ORAL | Status: AC
  Administered 2024-08-06: 1 via ORAL
  Filled 2024-08-06: qty 1

## 2024-08-06 MED ORDER — ONDANSETRON 8 MG PO TBDP
8.0000 mg | ORAL_TABLET | Freq: Once | ORAL | Status: AC
  Administered 2024-08-06: 8 mg via ORAL
  Filled 2024-08-06: qty 1

## 2024-08-06 NOTE — ED Triage Notes (Addendum)
 Patient said his left knee has been swollen since Saturday. Painful to walk or bend the knee. Tried icing, tried ibuprofen . Sore to the touch on the knee cap. Last time it swelled up he had to get it drained.

## 2024-08-06 NOTE — ED Provider Triage Note (Signed)
 Emergency Medicine Provider Triage Evaluation Note  Grant [name] , a 49 y.o. male  was evaluated in triage.  Pt complains of left knee swelling. Started Saturday.  Denies trauma.  Pain is prominent along the anterior aspect of the knee.  He states she has had swelling in that knee before and required drainage.  Denies fever.  States he still able to ambulate.  Review of Systems  Positive: See above Negative: See above  Physical Exam  BP (!) 177/110 (BP Location: Right Arm)   Pulse (!) 108   Temp 98.3 F (36.8 C) (Oral)   Resp 16   Ht 6' 1 (1.854 m)   Wt (!) 157 kg   SpO2 100%   BMI 45.67 kg/m  Gen:   Awake, no distress   Resp:  Normal effort  MSK:   Moves extremities without difficulty  Other:    Medical Decision Making  Medically screening exam initiated at 6:33 PM.  Appropriate orders placed.  Lynwood KATHEE Lobstein was informed that the remainder of the evaluation will be completed by another provider, this initial triage assessment does not replace that evaluation, and the importance of remaining in the ED until their evaluation is complete.  Work up started   Lang Norleen POUR, PA-C 08/06/24 1834

## 2024-08-07 MED ORDER — DICLOFENAC SODIUM 1 % EX GEL: 4.0000 g | Freq: Four times a day (QID) | CUTANEOUS | 0 refills | Status: AC

## 2024-08-07 MED ORDER — METHOCARBAMOL 500 MG PO TABS: 1000.0000 mg | ORAL_TABLET | Freq: Four times a day (QID) | ORAL | 0 refills | Status: AC | PRN

## 2024-08-07 MED ORDER — KETOROLAC TROMETHAMINE 15 MG/ML IJ SOLN
15.0000 mg | Freq: Once | INTRAMUSCULAR | Status: AC
  Administered 2024-08-07: 15 mg via INTRAMUSCULAR
  Filled 2024-08-07: qty 1

## 2024-08-07 MED ORDER — HYDROMORPHONE HCL 1 MG/ML IJ SOLN
1.0000 mg | Freq: Once | INTRAMUSCULAR | Status: AC
  Administered 2024-08-07: 1 mg via INTRAMUSCULAR
  Filled 2024-08-07: qty 1

## 2024-08-07 NOTE — ED Provider Notes (Signed)
 " [place] EMERGENCY DEPARTMENT AT [facility] Provider Note   CSN: [id number] Arrival date & time: 08/06/24  1820     Patient presents with: Joint Swelling   Grant [name] is a 49 y.o. male.   49 year old male presents for evaluation of left knee pain.  He states has been swollen for a few days.  States last time this happened he needed his knee drained.  States ibuprofen  and ice is not helping and it hurts to walk.  States he feels like he cannot bend it is feels stiff.  He denies any falls or injury.  Denies any other symptoms or concerns.        Prior to Admission medications  Medication Sig Start Date End Date Taking? Authorizing Provider  diclofenac  Sodium (VOLTAREN ) 1 % GEL Apply 4 g topically 4 (four) times daily. 08/07/24  Yes Cedric Denison L, DO  methocarbamol  (ROBAXIN ) 500 MG tablet Take 2 tablets (1,000 mg total) by mouth every 6 (six) hours as needed for up to 7 days for muscle spasms. 08/07/24 08/14/24 Yes Keirah Konitzer L, DO  apixaban  (ELIQUIS ) 5 MG TABS tablet Take 1 tablet (5 mg total) by mouth 2 (two) times daily. 07/05/23 07/04/24  Daneen Damien BROCKS, NP  dicyclomine  (BENTYL ) 20 MG tablet Take 1 tablet (20 mg total) by mouth 2 (two) times daily as needed. 09/20/23   Silver Wonda LABOR, PA  ezetimibe  (ZETIA ) 10 MG tablet Take 1 tablet (10 mg total) by mouth daily. 06/24/23 06/23/24  Von Bellis, MD  HYDROcodone -acetaminophen  (NORCO/VICODIN) 5-325 MG tablet Take 1 tablet by mouth every 6 (six) hours as needed. 04/09/24 04/09/25  Saunders Shona CROME, PA-C  lidocaine  (LIDODERM ) 5 % Place 1 patch onto the skin daily. Remove & Discard patch within 12 hours or as directed by MD 10/07/23   Nettie, April, MD  methocarbamol  (ROBAXIN ) 500 MG tablet 1-2 tablets q 6 hours prn muscle spasms 04/09/24   Saunders Shona CROME, PA-C  metoprolol  succinate (TOPROL -XL) 25 MG 24 hr tablet Take 1 tablet (25 mg total) by mouth daily. 09/12/23   Amin, Sumayya, MD  naproxen  (NAPROSYN ) 500  MG tablet Take 1 tablet (500 mg total) by mouth 2 (two) times daily with a meal. 04/09/24   Saunders Shona CROME, PA-C  ondansetron  (ZOFRAN -ODT) 4 MG disintegrating tablet Take 1 tablet (4 mg total) by mouth every 8 (eight) hours as needed. 04/30/24   Curatolo, Adam, DO  pantoprazole  (PROTONIX ) 40 MG tablet Take 1 tablet (40 mg total) by mouth daily. 06/24/23 06/23/24  Von Bellis, MD  rosuvastatin  (CRESTOR ) 40 MG tablet Take 1 tablet (40 mg total) by mouth daily. 02/12/23   Caleen Qualia, MD    Allergies: Patient has no known allergies.    Review of Systems  Constitutional:  Negative for chills and fever.  HENT:  Negative for ear pain and sore throat.   Eyes:  Negative for pain and visual disturbance.  Respiratory:  Negative for cough and shortness of breath.   Cardiovascular:  Negative for chest pain and palpitations.  Gastrointestinal:  Negative for abdominal pain and vomiting.  Genitourinary:  Negative for dysuria and hematuria.  Musculoskeletal:  Negative for arthralgias and back pain.       Admits left knee pain and swelling   Skin:  Negative for color change and rash.  Neurological:  Negative for seizures and syncope.  All other systems reviewed and are negative.   Updated Vital Signs BP 121/70 (BP Location: Left Arm)  Pulse 63   Temp 98 F (36.7 C)   Resp 18   Ht 6' 1 (1.854 m)   Wt (!) 157 kg   SpO2 99%   BMI 45.67 kg/m   Physical Exam Vitals and nursing note reviewed.  Constitutional:      General: He is not in acute distress.    Appearance: Normal appearance. He is well-developed. He is not ill-appearing.  HENT:     Head: Normocephalic and atraumatic.  Eyes:     Conjunctiva/sclera: Conjunctivae normal.  Cardiovascular:     Rate and Rhythm: Normal rate and regular rhythm.     Heart sounds: No murmur heard. Pulmonary:     Effort: Pulmonary effort is normal. No respiratory distress.     Breath sounds: Normal breath sounds.  Abdominal:     Palpations: Abdomen  is soft.     Tenderness: There is no abdominal tenderness.  Musculoskeletal:        General: Swelling present.     Cervical back: Neck supple.     Comments: There is very mild swelling to the knee, some tenderness to palpation over the posterior knee as well as the lateral aspect with some mild lateral patellar space swelling.  There is no erythema or warmth, almost full passive range of motion of the knee  Skin:    General: Skin is warm and dry.     Capillary Refill: Capillary refill takes less than 2 seconds.  Neurological:     General: No focal deficit present.     Mental Status: He is alert.  Psychiatric:        Mood and Affect: Mood normal.     (all labs ordered are listed, but only abnormal results are displayed) Labs Reviewed  BASIC METABOLIC PANEL WITH GFR - Abnormal; Notable for the following components:      Result Value   Glucose, Bld 102 (*)    All other components within normal limits  CBC WITH DIFFERENTIAL/PLATELET    EKG: None  Radiology: DG Knee Complete 4 Views Left Result Date: 08/06/2024 EXAM: 4 OR MORE VIEW(S) XRAY OF THE LEFT KNEE 08/06/2024 06:58:57 PM COMPARISON: None available. CLINICAL HISTORY: pain FINDINGS: BONES AND JOINTS: No acute fracture. No malalignment. Minimal narrowing of the lateral compartment with small osteophyte formation. The patellofemoral compartment also appears mildly narrowed. SOFT TISSUES: Unremarkable. IMPRESSION: 1. No acute fracture or dislocation. Trace joint effusion. 2. Very early changes of bicompartmental osteoarthritis. Electronically signed by: Rogelia Myers MD MD 08/06/2024 08:05 PM EST RP Workstation: [id number]     Procedures   Medications Ordered in the ED  ketorolac  (TORADOL ) 15 MG/ML injection 15 mg (has no administration in time range)  HYDROmorphone  (DILAUDID ) injection 1 mg (has no administration in time range)  HYDROcodone -acetaminophen  (NORCO/VICODIN) 5-325 MG per tablet 1 tablet (1 tablet Oral Given 08/06/24  1838)  ondansetron  (ZOFRAN -ODT) disintegrating tablet 8 mg (8 mg Oral Given 08/06/24 1838)                                    Medical Decision Making Patient with knee pain and swelling.  Negative workup here, no infectious symptoms.  He will be placed in a Ace wrap and a knee brace and given Toradol  and Dilaudid .  Will give prescription for Robaxin  and Voltaren  gel.  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  Advise close follow-up with them and otherwise return to the  ER for any new or worsening symptoms.  He feels comfortable being discharged home.  Problems Addressed: Acute pain of left knee: acute illness or injury  Amount and/or Complexity of Data Reviewed External Data Reviewed: notes.    Details: Prior ED records reviewed patient recently seen for chest pain Labs: ordered. Decision-making details documented in ED Course.    Details: Ordered and reviewed by me and unremarkable Radiology: ordered and independent interpretation performed. Decision-making details documented in ED Course.    Details: Ordered and interpreted by me independently of radiology Left knee x-ray: Shows no acute abnormality  Risk OTC drugs. Prescription drug management.     Final diagnoses:  Acute pain of left knee    ED Discharge Orders          Ordered    diclofenac  Sodium (VOLTAREN ) 1 % GEL  4 times daily        08/07/24 0825    methocarbamol  (ROBAXIN ) 500 MG tablet  Every 6 hours PRN        08/07/24 0825               Adil Tugwell L, DO 08/07/24 9162  "

## 2024-08-07 NOTE — Discharge Instructions (Addendum)
 Follow-up with orthopedic surgery.  Call the office to make an appointment.  You can take your Robaxin  up to 4 times a day and use your Voltaren  gel up to 4 times a day.  Use ice as well as your Ace wrap and knee brace as needed.  You can alternate Tylenol  and Motrin  every 3 hours as needed for pain.

## 2024-08-07 NOTE — Progress Notes (Signed)
 Orthopedic Tech Progress Note Patient Details:  Grant [name] October 11, 1975 [id number]  Ortho Devices Type of Ortho Device: Ace wrap Ortho Device/Splint Location: left knee Ortho Device/Splint Interventions: Ordered, Application, Adjustment   Post Interventions Patient Tolerated: Well Instructions Provided: Adjustment of device, Care of device  [name] 08/07/2024, 9:18 AM
